# Patient Record
Sex: Female | Born: 1940 | Race: White | Hispanic: No | Smoking: Never smoker
Health system: Southern US, Community
[De-identification: ages and names within clinical notes are randomized; demographics above are authoritative.]

## PROBLEM LIST (undated history)

## (undated) DIAGNOSIS — Z9221 Personal history of antineoplastic chemotherapy: Secondary | ICD-10-CM

## (undated) DIAGNOSIS — M1990 Unspecified osteoarthritis, unspecified site: Secondary | ICD-10-CM

## (undated) DIAGNOSIS — C50919 Malignant neoplasm of unspecified site of unspecified female breast: Secondary | ICD-10-CM

## (undated) DIAGNOSIS — Z923 Personal history of irradiation: Secondary | ICD-10-CM

## (undated) DIAGNOSIS — F419 Anxiety disorder, unspecified: Secondary | ICD-10-CM

## (undated) DIAGNOSIS — D649 Anemia, unspecified: Secondary | ICD-10-CM

## (undated) DIAGNOSIS — F32A Depression, unspecified: Secondary | ICD-10-CM

## (undated) HISTORY — PX: TONSILLECTOMY: SUR1361

## (undated) HISTORY — PX: CHOLECYSTECTOMY: SHX55

## (undated) HISTORY — PX: WISDOM TOOTH EXTRACTION: SHX21

## (undated) HISTORY — PX: ABDOMINAL HYSTERECTOMY: SHX81

## (undated) HISTORY — PX: JOINT REPLACEMENT: SHX530

---

## 1974-02-15 HISTORY — PX: BREAST LUMPECTOMY: SHX2

## 1996-02-16 HISTORY — PX: ABDOMINAL HYSTERECTOMY: SHX81

## 1997-02-15 DIAGNOSIS — C50919 Malignant neoplasm of unspecified site of unspecified female breast: Secondary | ICD-10-CM

## 1997-02-15 DIAGNOSIS — C50411 Malignant neoplasm of upper-outer quadrant of right female breast: Secondary | ICD-10-CM

## 1997-02-15 HISTORY — DX: Malignant neoplasm of upper-outer quadrant of right female breast: C50.411

## 1997-02-15 HISTORY — DX: Malignant neoplasm of unspecified site of unspecified female breast: C50.919

## 1997-02-15 HISTORY — PX: BREAST LUMPECTOMY: SHX2

## 2014-08-30 ENCOUNTER — Emergency Department (HOSPITAL_COMMUNITY): Payer: Medicare Other

## 2014-08-30 ENCOUNTER — Emergency Department (HOSPITAL_COMMUNITY)
Admission: EM | Admit: 2014-08-30 | Discharge: 2014-08-31 | Disposition: A | Discharge status: Home / Self Care | Payer: Medicare Other | Attending: Emergency Medicine | Admitting: Emergency Medicine

## 2014-08-30 DIAGNOSIS — Z79899 Other long term (current) drug therapy: Secondary | ICD-10-CM | POA: Diagnosis not present

## 2014-08-30 DIAGNOSIS — R42 Dizziness and giddiness: Secondary | ICD-10-CM | POA: Diagnosis not present

## 2014-08-30 DIAGNOSIS — R410 Disorientation, unspecified: Secondary | ICD-10-CM | POA: Diagnosis not present

## 2014-08-30 DIAGNOSIS — R4182 Altered mental status, unspecified: Secondary | ICD-10-CM | POA: Diagnosis present

## 2014-08-30 DIAGNOSIS — N3000 Acute cystitis without hematuria: Secondary | ICD-10-CM | POA: Diagnosis not present

## 2014-08-30 LAB — COMPREHENSIVE METABOLIC PANEL
ALT: 28 U/L (ref 14–54)
AST: 31 U/L (ref 15–41)
Albumin: 3.5 g/dL (ref 3.5–5.0)
Alkaline Phosphatase: 50 U/L (ref 38–126)
Anion gap: 11 (ref 5–15)
BILIRUBIN TOTAL: 0.4 mg/dL (ref 0.3–1.2)
BUN: 17 mg/dL (ref 6–20)
CALCIUM: 8.7 mg/dL — AB (ref 8.9–10.3)
CHLORIDE: 101 mmol/L (ref 101–111)
CO2: 24 mmol/L (ref 22–32)
Creatinine, Ser: 0.82 mg/dL (ref 0.44–1.00)
GFR calc non Af Amer: >60 mL/min (ref >60)
GLUCOSE: 101 mg/dL — AB (ref 65–99)
POTASSIUM: 3.1 mmol/L — AB (ref 3.5–5.1)
Sodium: 136 mmol/L (ref 135–145)
Total Protein: 6.6 g/dL (ref 6.5–8.1)

## 2014-08-30 LAB — URINALYSIS, ROUTINE W REFLEX MICROSCOPIC
BILIRUBIN URINE: NEGATIVE
GLUCOSE, UA: NEGATIVE mg/dL
Hgb urine dipstick: NEGATIVE
KETONES UR: NEGATIVE mg/dL
Nitrite: NEGATIVE
Protein, ur: NEGATIVE mg/dL
Specific Gravity, Urine: 1.016 (ref 1.005–1.030)
UROBILINOGEN UA: 0.2 mg/dL (ref 0.0–1.0)
pH: 5 (ref 5.0–8.0)

## 2014-08-30 LAB — CBC WITH DIFFERENTIAL/PLATELET
Basophils Absolute: 0.1 10*3/uL (ref 0.0–0.1)
Basophils Relative: 1 % (ref 0–1)
EOS ABS: 0.2 10*3/uL (ref 0.0–0.7)
EOS PCT: 2 % (ref 0–5)
HEMATOCRIT: 35.2 % — AB (ref 36.0–46.0)
HEMOGLOBIN: 12.3 g/dL (ref 12.0–15.0)
LYMPHS ABS: 2.7 10*3/uL (ref 0.7–4.0)
Lymphocytes Relative: 31 % (ref 12–46)
MCH: 29.6 pg (ref 26.0–34.0)
MCHC: 34.9 g/dL (ref 30.0–36.0)
MCV: 84.6 fL (ref 78.0–100.0)
MONO ABS: 0.6 10*3/uL (ref 0.1–1.0)
MONOS PCT: 7 % (ref 3–12)
NEUTROS PCT: 59 % (ref 43–77)
Neutro Abs: 5.2 10*3/uL (ref 1.7–7.7)
Platelets: 248 10*3/uL (ref 150–400)
RBC: 4.16 MIL/uL (ref 3.87–5.11)
RDW: 12.7 % (ref 11.5–15.5)
WBC: 8.8 10*3/uL (ref 4.0–10.5)

## 2014-08-30 LAB — URINE MICROSCOPIC-ADD ON

## 2014-08-30 LAB — TROPONIN I: Troponin I: <0.03 ng/mL (ref <0.031)

## 2014-08-30 LAB — I-STAT CG4 LACTIC ACID, ED: Lactic Acid, Venous: 2.89 mmol/L (ref 0.5–2.0)

## 2014-08-30 MED ORDER — DIPHENHYDRAMINE HCL 50 MG/ML IJ SOLN
25.0000 mg | Freq: Once | INTRAMUSCULAR | Status: AC
  Administered 2014-08-30: 25 mg via INTRAVENOUS
  Filled 2014-08-30: qty 1

## 2014-08-30 MED ORDER — DEXTROSE 5 % IV SOLN
1.0000 g | Freq: Once | INTRAVENOUS | Status: AC
  Administered 2014-08-30: 1 g via INTRAVENOUS
  Filled 2014-08-30: qty 10

## 2014-08-30 MED ORDER — SODIUM CHLORIDE 0.9 % IV BOLUS (SEPSIS)
1000.0000 mL | Freq: Once | INTRAVENOUS | Status: AC
  Administered 2014-08-30: 1000 mL via INTRAVENOUS

## 2014-08-30 MED ORDER — METOCLOPRAMIDE HCL 5 MG/ML IJ SOLN
10.0000 mg | Freq: Once | INTRAMUSCULAR | Status: AC
  Administered 2014-08-30: 10 mg via INTRAVENOUS
  Filled 2014-08-30: qty 2

## 2014-08-30 MED ORDER — KETOROLAC TROMETHAMINE 30 MG/ML IJ SOLN
30.0000 mg | Freq: Once | INTRAMUSCULAR | Status: AC
  Administered 2014-08-30: 30 mg via INTRAVENOUS
  Filled 2014-08-30: qty 1

## 2014-08-30 NOTE — ED Notes (Signed)
Pt [place] movie [place] via [facility]. Pt family states she has been acting confused for a few months, but the pt lives out of town and the daughter hasn't seen her in "a while".  Pt flew in yesterday from [place] and the family has noticed a series of odd behavior. Pt went to movies today and kept falling asleep during the movie and was confused. There is a strong hx of dementia in the family. The pt mother and sister have alzheimer's along with a hx of tia and stroke.

## 2014-08-30 NOTE — ED Provider Notes (Signed)
CSN: [id number]     Arrival date & time 08/30/14  1723 History   First MD Initiated Contact with Patient 08/30/14 1727     Chief Complaint  Patient presents with  . Altered Mental Status     (Consider location/radiation/quality/duration/timing/severity/associated sxs/prior Treatment) Patient is a 74 y.o. female presenting with altered mental status.  Altered Mental Status Presenting symptoms: behavior changes and confusion   Severity:  Moderate Most recent episode:  Today Episode history:  Continuous Duration:  4 hours Timing:  Constant Progression:  Partially resolved Chronicity:  New Context comment:  While walking into a theater Associated symptoms: light-headedness   Associated symptoms: no abdominal pain, no nausea and no vomiting   Associated symptoms comment:  Tingling/numbness in RUE   No past medical history on file. No past surgical history on file. No family history on file. History  Substance Use Topics  . Smoking status: Not on file  . Smokeless tobacco: Not on file  . Alcohol Use: Not on file   OB History    No data available     Review of Systems  Gastrointestinal: Negative for nausea, vomiting and abdominal pain.  Neurological: Positive for light-headedness.  Psychiatric/Behavioral: Positive for confusion.  All other systems reviewed and are negative.     Allergies  Eggs or egg-derived products and Gluten meal  Home Medications   Prior to Admission medications   Medication Sig Start Date End Date Taking? Authorizing Provider  cephALEXin (KEFLEX) 500 MG capsule Take 1 capsule (500 mg total) by mouth 4 (four) times daily. 08/31/14   Debby Freiberg, MD  Cholecalciferol (VITAMIN D3) 3000 UNITS TABS Take 3,000 Units by mouth daily.   Yes Historical Provider, MD  doxycycline (VIBRA-TABS) 100 MG tablet Take 100 mg by mouth 2 (two) times daily. 08/22/14  Yes Historical Provider, MD  doxycycline (VIBRAMYCIN) 50 MG capsule Take 50 mg by mouth daily.  08/13/14  Yes Historical Provider, MD  DULoxetine (CYMBALTA) 60 MG capsule Take 60 mg by mouth daily. 05/27/14  Yes Historical Provider, MD  FML FORTE 0.25 % ophthalmic suspension Place 1 drop into both eyes 2 (two) times daily. 08/06/14  Yes Historical Provider, MD  hydroxypropyl cellulose (LACRISERT) 5 MG INST Place 5 mg into both eyes daily. 08/21/14  Yes Historical Provider, MD  ibuprofen (ADVIL,MOTRIN) 800 MG tablet Take 800 mg by mouth 3 (three) times daily.  08/14/14  Yes Historical Provider, MD  liothyronine (CYTOMEL) 5 MCG tablet Take 5 mcg by mouth daily.   Yes Historical Provider, MD  metFORMIN (GLUCOPHAGE) 1000 MG tablet Take 1,000 mg by mouth 2 (two) times daily. 08/05/14  Yes Historical Provider, MD  nystatin (MYCOSTATIN) 500000 UNITS TABS tablet Take 500,000 Units by mouth daily.   Yes Historical Provider, MD  prednisoLONE acetate (PRED FORTE) 1 % ophthalmic suspension Place 1 drop into both eyes 2 (two) times daily. 08/22/14  Yes Historical Provider, MD  RESTASIS 0.05 % ophthalmic emulsion Place 1 drop into both eyes 3 (three) times daily. 08/04/14  Yes Historical Provider, MD   BP 164/69 mmHg  Pulse 86  Temp(Src) 97.6 F (36.4 C) (Oral)  Resp 20  SpO2 99% Physical Exam  Constitutional: She is oriented to person, place, and time. She appears well-developed and well-nourished.  HENT:  Head: Normocephalic and atraumatic.  Right Ear: External ear normal.  Left Ear: External ear normal.  Eyes: Conjunctivae and EOM are normal. Pupils are equal, round, and reactive to light.  Neck: Normal range of motion. Neck  supple.  Cardiovascular: Normal rate, regular rhythm, normal heart sounds and intact distal pulses.   Pulmonary/Chest: Effort normal and breath sounds normal.  Abdominal: Soft. Bowel sounds are normal. There is no tenderness.  Musculoskeletal: Normal range of motion.  Neurological: She is alert and oriented to person, place, and time. She has normal strength and normal reflexes. No  cranial nerve deficit or sensory deficit. Gait (initially ataxic, however improves after standing) abnormal.  Skin: Skin is warm and dry.  Vitals reviewed.   ED Course  Procedures (including critical care time) Labs Review Labs Reviewed  CBC WITH DIFFERENTIAL/PLATELET - Abnormal; Notable for the following:    HCT 35.2 (*)    All other components within normal limits  COMPREHENSIVE METABOLIC PANEL - Abnormal; Notable for the following:    Potassium 3.1 (*)    Glucose, Bld 101 (*)    Calcium 8.7 (*)    All other components within normal limits  URINALYSIS, ROUTINE W REFLEX MICROSCOPIC (NOT AT [facility]) - Abnormal; Notable for the following:    Leukocytes, UA SMALL (*)    All other components within normal limits  URINE MICROSCOPIC-ADD ON - Abnormal; Notable for the following:    Squamous Epithelial / LPF FEW (*)    Bacteria, UA FEW (*)    Casts GRANULAR CAST (*)    Crystals CA OXALATE CRYSTALS (*)    All other components within normal limits  I-STAT CG4 LACTIC ACID, ED - Abnormal; Notable for the following:    Lactic Acid, Venous 2.89 (*)    All other components within normal limits  I-STAT CG4 LACTIC ACID, ED - Abnormal; Notable for the following:    Lactic Acid, Venous 2.08 (*)    All other components within normal limits  TROPONIN I    Imaging Review No results found.   EKG Interpretation None      MDM   Final diagnoses:  Acute cystitis without hematuria    74 y.o. female with pertinent PMH of chronic R hip pain presents with altered mental status.  Per family at bedside, since arriving yesterday the pt has been more confused that normal with slightly bizarre behavior.  4 hours PTA the pt had far more trouble walking and was weak with subjective RUE sensory decrease, however they watched a movie where the pt went to sleep.  She was difficult to arouse fully afterwards.  On arrival the pt has vitals and physical exam as above, marked only for some very mild initial  ataxia, however this resolved.  Consulted neurology who feel this is unlikely to be due to CVA or TIA, recommended MR.  This was unremarkable.  Spoke with family at length.  Pt DC home in stable condition.  Symptom free at time of dc.  I have reviewed all laboratory and imaging studies if ordered as above  1. Acute cystitis without hematuria         Debby Freiberg, MD 09/02/14 2336

## 2014-08-31 DIAGNOSIS — N3000 Acute cystitis without hematuria: Secondary | ICD-10-CM | POA: Diagnosis not present

## 2014-08-31 LAB — I-STAT CG4 LACTIC ACID, ED: Lactic Acid, Venous: 2.08 mmol/L (ref 0.5–2.0)

## 2014-08-31 MED ORDER — CEPHALEXIN 500 MG PO CAPS: 500.0000 mg | ORAL_CAPSULE | Freq: Four times a day (QID) | ORAL | Status: DC

## 2014-08-31 NOTE — Discharge Instructions (Signed)

## 2014-08-31 NOTE — ED Notes (Signed)
Pt is getting dressed.

## 2017-02-22 DIAGNOSIS — M545 Low back pain, unspecified: Secondary | ICD-10-CM | POA: Insufficient documentation

## 2017-02-22 DIAGNOSIS — M25569 Pain in unspecified knee: Secondary | ICD-10-CM | POA: Insufficient documentation

## 2017-02-22 HISTORY — DX: Pain in unspecified knee: M25.569

## 2017-02-22 HISTORY — DX: Low back pain, unspecified: M54.50

## 2018-01-31 DIAGNOSIS — Z96659 Presence of unspecified artificial knee joint: Secondary | ICD-10-CM | POA: Insufficient documentation

## 2018-01-31 HISTORY — DX: Presence of unspecified artificial knee joint: Z96.659

## 2018-10-26 ENCOUNTER — Other Ambulatory Visit: Payer: Self-pay | Admitting: Family Medicine

## 2018-10-26 DIAGNOSIS — Z1231 Encounter for screening mammogram for malignant neoplasm of breast: Secondary | ICD-10-CM

## 2018-11-30 ENCOUNTER — Ambulatory Visit (INDEPENDENT_AMBULATORY_CARE_PROVIDER_SITE_OTHER): Payer: Medicare Other | Admitting: Cardiovascular Disease

## 2018-11-30 ENCOUNTER — Encounter: Payer: Self-pay | Admitting: Cardiovascular Disease

## 2018-11-30 ENCOUNTER — Other Ambulatory Visit: Payer: Self-pay

## 2018-11-30 VITALS — BP 106/54 | HR 76 | Temp 97.2°F | Ht 65.0 in | Wt 133.0 lb

## 2018-11-30 DIAGNOSIS — E785 Hyperlipidemia, unspecified: Secondary | ICD-10-CM

## 2018-11-30 DIAGNOSIS — E875 Hyperkalemia: Secondary | ICD-10-CM | POA: Diagnosis not present

## 2018-11-30 DIAGNOSIS — Z008 Encounter for other general examination: Secondary | ICD-10-CM

## 2018-11-30 DIAGNOSIS — Z8249 Family history of ischemic heart disease and other diseases of the circulatory system: Secondary | ICD-10-CM

## 2018-11-30 DIAGNOSIS — R0989 Other specified symptoms and signs involving the circulatory and respiratory systems: Secondary | ICD-10-CM | POA: Diagnosis not present

## 2018-11-30 DIAGNOSIS — E782 Mixed hyperlipidemia: Secondary | ICD-10-CM | POA: Diagnosis not present

## 2018-11-30 HISTORY — DX: Hyperlipidemia, unspecified: E78.5

## 2018-11-30 NOTE — Patient Instructions (Signed)
Medication Instructions:  Your physician recommends that you continue on your current medications as directed. Please refer to the Current Medication list given to you today.  If you need a refill on your cardiac medications before your next appointment, please call your pharmacy.   Lab work: none If you have labs (blood work) drawn today and your tests are completely normal, you will receive your results only by: Marland Kitchen MyChart Message (if you have MyChart) OR . A paper copy in the mail If you have any lab test that is abnormal or we need to change your treatment, we will call you to review the results.  Testing/Procedures: Your physician has requested that you have a carotid duplex. This test is an ultrasound of the carotid arteries in your neck. It looks at blood flow through these arteries that supply the brain with blood. Allow one hour for this exam. There are no restrictions or special instructions. TO BE SCHEDULED   Follow-Up: At [facility], you and your health needs are our priority.  As part of our continuing mission to provide you with exceptional heart care, we have created designated Provider Care Teams.  These Care Teams include your primary Cardiologist (physician) and Advanced Practice Providers (APPs -  Physician Assistants and Nurse Practitioners) who all work together to provide you with the care you need, when you need it. You will need a follow up appointment in 12 months with Dr. Quay Burow.  Please call our office 2 months in advance to schedule this appointment.

## 2018-11-30 NOTE — Assessment & Plan Note (Signed)
History of hyperlipidemia intolerant to statin therapy with recent lipid profile performed 10/13/2018 by her PCP revealing total cholesterol 264, LDL 193 and HDL of 52 with an LDL particle number of 20-34.  She was on Repatha in the past but has not taken it recently.  We will attempt to restart her on Repatha.

## 2018-11-30 NOTE — Progress Notes (Signed)
11/30/2018 Jodi Summers   1940/08/09  [id number]  Primary Physician System, Pcp Not In Primary Cardiologist: Lorretta Harp MD Renae Gloss  HPI:  Jodi Summers is a 78 y.o. thin appearing widowed Caucasian female mother of 61, grandmother of 6 grandchildren referred by her PCP, Dr. Salomon Fick, to be established in my practice.  She is accompanied by [name].  She recently relocated from [place] to [place] in April to be closer to her children.  Her only cardiac risk factor is hyperlipidemia intolerant to statin therapy.  Her father did have a myocardial infarction.  She is never had a heart attack or stroke.  She denies chest pain or shortness of breath.  She was on Repatha in the past but has not been on it recently.  Recent lipid profile performed 10/05/2018 by her PCP revealed total cholesterol 264, LDL 193 with a particle number of 2234 and HDL 52.  She has had breast cancer in the past and has had surgery on her right breast.   Current Meds  Medication Sig  . Cholecalciferol (VITAMIN D3) 3000 UNITS TABS Take 3,000 Units by mouth daily.  Marland Kitchen denosumab (PROLIA) 60 MG/ML SOSY injection Inject 60 mg into the skin every 6 (six) months.  Marland Kitchen ibuprofen (ADVIL,MOTRIN) 800 MG tablet Take 800 mg by mouth 3 (three) times daily.   . Omega-3 1000 MG CAPS Take 1 capsule by mouth 2 (two) times daily.  . RESTASIS 0.05 % ophthalmic emulsion Place 1 drop into both eyes 3 (three) times daily.     Allergies  Allergen Reactions  . Eggs Or Egg-Derived Products   . Gluten Meal     Social History   Socioeconomic History  . Marital status: Widowed    Spouse name: Not on file  . Number of children: Not on file  . Years of education: Not on file  . Highest education level: Not on file  Occupational History  . Not on file  Social Needs  . Financial resource strain: Not on file  . Food insecurity    Worry: Not on file    Inability: Not on file  . Transportation needs    Medical: Not on file    Non-medical: Not on file  Tobacco Use  . Smoking status: Never Smoker  . Smokeless tobacco: Never Used  Substance and Sexual Activity  . Alcohol use: Not on file  . Drug use: Never  . Sexual activity: Not on file  Lifestyle  . Physical activity    Days per week: Not on file    Minutes per session: Not on file  . Stress: Not on file  Relationships  . Social Herbalist on phone: Not on file    Gets together: Not on file    Attends religious service: Not on file    Active member of club or organization: Not on file    Attends meetings of clubs or organizations: Not on file    Relationship status: Not on file  . Intimate partner violence    Fear of current or ex partner: Not on file    Emotionally abused: Not on file    Physically abused: Not on file    Forced sexual activity: Not on file  Other Topics Concern  . Not on file  Social History Narrative  . Not on file     Review of Systems: General: negative for chills, fever, night sweats or weight changes.  Cardiovascular: negative for chest pain, dyspnea on exertion, edema, orthopnea, palpitations, paroxysmal nocturnal dyspnea or shortness of breath Dermatological: negative for rash Respiratory: negative for cough or wheezing Urologic: negative for hematuria Abdominal: negative for nausea, vomiting, diarrhea, bright red blood per rectum, melena, or hematemesis Neurologic: negative for visual changes, syncope, or dizziness All other systems reviewed and are otherwise negative except as noted above.    Blood pressure 105/65, pulse 76, temperature (!) 97.2 F (36.2 C), temperature source Temporal, height 5\' 5"  (1.651 m), weight 133 lb (60.3 kg), SpO2 98 %.  General appearance: alert and no distress Neck: no adenopathy, no JVD, supple, symmetrical, trachea midline, thyroid not enlarged, symmetric, no tenderness/mass/nodules and Soft bilateral carotid bruits Lungs: clear to auscultation  bilaterally Heart: regular rate and rhythm, S1, S2 normal, no murmur, click, rub or gallop Extremities: extremities normal, atraumatic, no cyanosis or edema Pulses: 2+ and symmetric Skin: Skin color, texture, turgor normal. No rashes or lesions Neurologic: Alert and oriented X 3, normal strength and tone. Normal symmetric reflexes. Normal coordination and gait  EKG sinus rhythm at 75 without ST or T wave changes.  Personally reviewed this EKG.  ASSESSMENT AND PLAN:   Hyperlipidemia History of hyperlipidemia intolerant to statin therapy with recent lipid profile performed 10/13/2018 by her PCP revealing total cholesterol 264, LDL 193 and HDL of 52 with an LDL particle number of 20-34.  She was on Repatha in the past but has not taken it recently.  We will attempt to restart her on Repatha.      Lorretta Harp MD FACP,FACC,FAHA, [facility] 11/30/2018 11:43 AM

## 2018-12-05 ENCOUNTER — Encounter: Payer: Self-pay | Admitting: *Deleted

## 2018-12-05 ENCOUNTER — Other Ambulatory Visit: Payer: Self-pay

## 2018-12-05 ENCOUNTER — Ambulatory Visit (HOSPITAL_COMMUNITY)
Admission: RE | Admit: 2018-12-05 | Discharge: 2018-12-05 | Disposition: A | Discharge status: Home / Self Care | Payer: Medicare Other | Source: Ambulatory Visit | Attending: Cardiology | Admitting: Cardiology

## 2018-12-05 DIAGNOSIS — R0989 Other specified symptoms and signs involving the circulatory and respiratory systems: Secondary | ICD-10-CM | POA: Insufficient documentation

## 2018-12-11 ENCOUNTER — Telehealth: Payer: Self-pay

## 2018-12-11 MED ORDER — REPATHA SURECLICK 140 MG/ML ~~LOC~~ SOAJ: 140.0000 mg | SUBCUTANEOUS | 11 refills | Status: DC

## 2018-12-11 NOTE — Telephone Encounter (Signed)
Called and lmomed the pt regarding the approval of the repatha and sent rx instructed the pt to callback if the medication is unaffordable

## 2018-12-13 ENCOUNTER — Other Ambulatory Visit: Payer: Self-pay

## 2018-12-13 ENCOUNTER — Ambulatory Visit
Admission: RE | Admit: 2018-12-13 | Discharge: 2018-12-13 | Disposition: A | Discharge status: Home / Self Care | Payer: Medicare Other | Source: Ambulatory Visit | Attending: Family Medicine | Admitting: Family Medicine

## 2018-12-13 DIAGNOSIS — Z1231 Encounter for screening mammogram for malignant neoplasm of breast: Secondary | ICD-10-CM

## 2018-12-13 HISTORY — DX: Personal history of irradiation: Z92.3

## 2018-12-13 HISTORY — DX: Malignant neoplasm of unspecified site of unspecified female breast: C50.919

## 2018-12-13 HISTORY — DX: Personal history of antineoplastic chemotherapy: Z92.21

## 2019-04-03 ENCOUNTER — Other Ambulatory Visit: Payer: Self-pay

## 2019-04-03 ENCOUNTER — Ambulatory Visit (INDEPENDENT_AMBULATORY_CARE_PROVIDER_SITE_OTHER): Payer: Medicare Other | Admitting: Sports Medicine

## 2019-04-03 VITALS — BP 116/54 | Ht 65.0 in | Wt 133.0 lb

## 2019-04-03 DIAGNOSIS — M2140 Flat foot [pes planus] (acquired), unspecified foot: Secondary | ICD-10-CM

## 2019-04-03 DIAGNOSIS — M25551 Pain in right hip: Secondary | ICD-10-CM

## 2019-04-03 DIAGNOSIS — M2142 Flat foot [pes planus] (acquired), left foot: Secondary | ICD-10-CM

## 2019-04-03 DIAGNOSIS — M1611 Unilateral primary osteoarthritis, right hip: Secondary | ICD-10-CM | POA: Diagnosis not present

## 2019-04-03 DIAGNOSIS — M2141 Flat foot [pes planus] (acquired), right foot: Secondary | ICD-10-CM

## 2019-04-03 HISTORY — DX: Flat foot (pes planus) (acquired), unspecified foot: M21.40

## 2019-04-03 NOTE — Assessment & Plan Note (Signed)
Considering her poor ROM this is likely advanced OA  With her lack of too much pain and current desire not to be aggressive we will plan supportive care  Try tumeric  Better shoe wear and orthotics

## 2019-04-03 NOTE — Assessment & Plan Note (Signed)
She needs custom orthotics Better and high cushion support for shoes  We scheduled for this

## 2019-04-03 NOTE — Progress Notes (Signed)
   [facility] [address] [place], [place] [address] Phone: [phone] Fax: [phone]   Patient Name: Jodi Summers Date of Birth: 08/05/1940 Medical Record Number: [id number] Gender: female Date of Encounter: 04/03/2019  SUBJECTIVE:      Chief Complaint:  Right hip pain   HPI:  Patient is a 79 year old female presenting with chronic right hip pain.  She had a traumatic fall about 4 years ago, and at that time was told that she would likely eventually require right hip replacement.  She describes having more difficulty walking and performing normal activities.  Her daughter accompanies her today, and feels it is hindering her from participating in her centers exercise classes.  She is utilizing a cane prophylactically for comfort.  She will take an Advil daily for the pain.  She did complete formal physical therapy for the right hip last fall that seemed to offer some relief, but she is not continue those exercises.  She denies any radiating pain into her toes.  No erythema or swelling at the hip.  The pain does radiate into the groin.     ROS:     See HPI.   PERTINENT  PMH / PSH / FH / SH:  Past Medical, Surgical, Social, and Family History Reviewed & Updated in the EMR. Pertinent findings include:  Left TKA in fall 2020, osteoporosis, pes planus   OBJECTIVE:  BP (!) 116/54   Ht 5\' 5"  (1.651 m)   Wt 133 lb (60.3 kg)   BMI 22.13 kg/m  Physical Exam:  Vital signs are reviewed.   GEN: Alert and oriented, NAD Pulm: Breathing unlabored PSY: normal mood, congruent affect  MSK: Right hip: ROM IR: 3 deg, ER: 45 Deg, Flexion: 100 Deg,  Strength IR: 4/5, ER: 4/5, Flexion: 4/5, Abduction: 5/5 Pelvic alignment unremarkable to inspection and palpation. Standing hip rotation and gait with positive trendelenburg sign  Greater trochanter without tenderness to palpation. No tenderness over piriformis. Pain with FADIR. 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 Right hip pain  Patient likely suffers from hip OA. This was confirmed in [place] when she was hospitalized for her TKA.  Given the pes planus with bilateral navicular drop, we will start with making custom orthotics to give patient better support at her feet after she purchases new sneakers and brings them to her appointment.  We recommended continuing walking to the point of discomfort.  She was also taught some very basic stretches today.   Lanier Clam, DO, ATC Sports Medicine Fellow  I observed and examined the patient with Dr. Kathrynn Speed and agree with assessment and plan.  Note reviewed and modified by me.  I spent 32 minutes with this patient. Over 50% of visit was spend in counseling and coordination of care for problems with Hip osteoarthritis and foot pain.  Ila Mcgill, MD

## 2019-04-24 ENCOUNTER — Other Ambulatory Visit: Payer: Self-pay

## 2019-04-24 ENCOUNTER — Ambulatory Visit (INDEPENDENT_AMBULATORY_CARE_PROVIDER_SITE_OTHER): Payer: Medicare Other | Admitting: Sports Medicine

## 2019-04-24 ENCOUNTER — Encounter: Payer: Self-pay | Admitting: Sports Medicine

## 2019-04-24 VITALS — BP 120/62 | Ht 65.5 in | Wt 140.0 lb

## 2019-04-24 DIAGNOSIS — M2142 Flat foot [pes planus] (acquired), left foot: Secondary | ICD-10-CM | POA: Diagnosis not present

## 2019-04-24 DIAGNOSIS — M2141 Flat foot [pes planus] (acquired), right foot: Secondary | ICD-10-CM | POA: Diagnosis present

## 2019-04-24 NOTE — Progress Notes (Signed)
HPI: Patient is here today to be fitted for standard orthotic. Pt is being treated for bilateral navicular drop, right worse than left in the setting of pes planus  BP 120/62   Ht 5' 5.5" (1.664 m)   Wt 140 lb (63.5 kg)   BMI 22.94 kg/m   Patient was fitted for a standard, cushioned, semi-rigid orthotic.  The orthotic was heated and the patient stood on the orthotic blank positioned on the orthotic stand. The patient was positioned in subtalar neutral position and 10 degrees of ankle dorsiflexion in a weight bearing stance. After molding, a stable Fast-Tech EVA base was applied to the orthotic blank.   The blank was ground to a stable position for weight bearing. Size: 7 base: Blue EVA posting: none additional orthotic padding: Bilateral scaphoid pads  Patient understood RTC needs and will follow up prn  Lanier Clam, DO, ATC Sports Medicine Fellow  I observed and examined the patient with Dr. Kathrynn Speed and agree with assessment and plan.  Note reviewed and modified by me. Ila Mcgill, MD

## 2019-07-05 ENCOUNTER — Other Ambulatory Visit: Payer: Self-pay

## 2019-07-05 ENCOUNTER — Ambulatory Visit (INDEPENDENT_AMBULATORY_CARE_PROVIDER_SITE_OTHER): Payer: Medicare Other | Admitting: Sports Medicine

## 2019-07-05 ENCOUNTER — Encounter: Payer: Self-pay | Admitting: Sports Medicine

## 2019-07-05 VITALS — BP 118/62 | Ht 65.5 in | Wt 135.0 lb

## 2019-07-05 DIAGNOSIS — M1611 Unilateral primary osteoarthritis, right hip: Secondary | ICD-10-CM | POA: Diagnosis present

## 2019-07-05 NOTE — Patient Instructions (Signed)
The pain in your hip is caused by arthritis of the hip.  This is where the cartilage is worn down there is no cushioning for the bones when you walk. -You may take Aleve and Tylenol as needed for your pain -Given the advanced stage of the arthritis it is unlikely doing steroid injections would provide you with much benefit -You may continue to work on your hip strengthening exercises at home from before if you find these helpful -We will refer you to Dr. Ninfa Linden to discuss the possibility of a hip replacement surgery -Please not hesitate to call if you have any questions or concerns

## 2019-07-05 NOTE — Progress Notes (Signed)
PCP: Modena Nunnery, MD  Subjective:   HPI: Patient is a 79 y.o. female here for follow-up of right hip pain.  Patient has had chronic right hip pain for the last several years.  Pain is located in the groin and radiates towards her buttocks.  Patient had x-rays done at [facility] which confirmed the presence of osteoarthritis in the hip.  Patient notes pain at nighttime as well as intermittent pain with walking throughout the day.  No radiation of the pain.  Patient was evaluated for this back in February.  She was given strengthening exercises to work on at home.  She is also done formal physical therapy in the past for her hip.  Neither of these have helped improve her pain.  Patient was also seen again in March given orthotics for pes planus to help provide additional cushioning for her hip.  Patient notes this did help pain in her feet but did not help her hip pain.  Over-the-counter anti-inflammatories mildly improved her pain.   Review of Systems: See HPI above. + night time pain + now needing a cane for walking to lessen pain  Past Medical History:  Diagnosis Date  . Breast cancer ([place]) 1970s   right breast  . Personal history of chemotherapy   . Personal history of radiation therapy     Current Outpatient Medications on File Prior to Visit  Medication Sig Dispense Refill  . Cholecalciferol (VITAMIN D3) 3000 UNITS TABS Take 3,000 Units by mouth daily.    Marland Kitchen denosumab (PROLIA) 60 MG/ML SOSY injection Inject 60 mg into the skin every 6 (six) months.    . Evolocumab (REPATHA SURECLICK) XX123456 MG/ML SOAJ Inject 140 mg into the skin every 14 (fourteen) days. 2 pen 11  . ibuprofen (ADVIL,MOTRIN) 800 MG tablet Take 800 mg by mouth 3 (three) times daily.     . Omega-3 1000 MG CAPS Take 1 capsule by mouth 2 (two) times daily.    . RESTASIS 0.05 % ophthalmic emulsion Place 1 drop into both eyes 3 (three) times daily.     No current facility-administered medications on file prior to visit.     Past Surgical History:  Procedure Laterality Date  . BREAST LUMPECTOMY Right 1976    Allergies  Allergen Reactions  . Eggs Or Egg-Derived Products   . Gluten Meal     Social History   Socioeconomic History  . Marital status: Widowed    Spouse name: Not on file  . Number of children: Not on file  . Years of education: Not on file  . Highest education level: Not on file  Occupational History  . Not on file  Tobacco Use  . Smoking status: Never Smoker  . Smokeless tobacco: Never Used  Substance and Sexual Activity  . Alcohol use: Not on file  . Drug use: Never  . Sexual activity: Not on file  Other Topics Concern  . Not on file  Social History Narrative  . Not on file   Social Determinants of Health   Financial Resource Strain:   . Difficulty of Paying Living Expenses:   Food Insecurity:   . Worried About Charity fundraiser in the Last Year:   . Arboriculturist in the Last Year:   Transportation Needs:   . Film/video editor (Medical):   Marland Kitchen Lack of Transportation (Non-Medical):   Physical Activity:   . Days of Exercise per Week:   . Minutes of Exercise per Session:  Stress:   . Feeling of Stress :   Social Connections:   . Frequency of Communication with Friends and Family:   . Frequency of Social Gatherings with Friends and Family:   . Attends Religious Services:   . Active Member of Clubs or Organizations:   . Attends Archivist Meetings:   Marland Kitchen Marital Status:   Intimate Partner Violence:   . Fear of Current or Ex-Partner:   . Emotionally Abused:   Marland Kitchen Physically Abused:   . Sexually Abused:     History reviewed. No pertinent family history.      Objective:  Physical Exam: BP 118/62   Ht 5' 5.5" (1.664 m)   Wt 135 lb (61.2 kg)   BMI 22.12 kg/m  Gen: NAD, comfortable in exam room Lungs: Breathing comfortably on room air Hip Exam Right -Inspection: No deformity, no discoloration -Palpation: SI joint: non-tender; greater  trochanter: non-tender -ROM: Unable to internally rotate her hip.  Patient had about 30 degrees of external rotation.  Normal range of motion with hip flexion -Strength: Flexion: 5/5; Extension 5/5; Abduction: 5/5 -Special Tests; Log roll: Positive -Limb neurovascularly intact  Left hip has 35 deg ER and 15 deg IR   Assessment & Plan:  Patient is a 79 y.o. female here for follow-up of right hip pain  1.  Right hip osteoarthritis -X-rays from 8 months ago were reviewed showing moderate severe osteoarthritis of the right hip.  Patient also with a cam deformity -Patient will referred to Dr. Ninfa Linden discussed surgical treatments -Patient will take over-the-counter anti-inflammatories as needed for pain in the meantime  Patient will follow up here as needed  I observed and examined the patient with Dr. Margy Clarks and agree with assessment and plan.  Note reviewed and modified by me. Ila Mcgill, MD

## 2019-07-18 ENCOUNTER — Ambulatory Visit: Payer: Medicare Other | Admitting: Orthopaedic Surgery

## 2019-08-08 ENCOUNTER — Ambulatory Visit (INDEPENDENT_AMBULATORY_CARE_PROVIDER_SITE_OTHER): Payer: Medicare Other

## 2019-08-08 ENCOUNTER — Encounter: Payer: Self-pay | Admitting: Orthopaedic Surgery

## 2019-08-08 ENCOUNTER — Other Ambulatory Visit: Payer: Self-pay

## 2019-08-08 ENCOUNTER — Ambulatory Visit (INDEPENDENT_AMBULATORY_CARE_PROVIDER_SITE_OTHER): Payer: Medicare Other | Admitting: Orthopaedic Surgery

## 2019-08-08 DIAGNOSIS — M1611 Unilateral primary osteoarthritis, right hip: Secondary | ICD-10-CM | POA: Diagnosis not present

## 2019-08-08 DIAGNOSIS — M25551 Pain in right hip: Secondary | ICD-10-CM

## 2019-08-08 HISTORY — DX: Unilateral primary osteoarthritis, right hip: M16.11

## 2019-08-08 NOTE — Progress Notes (Signed)
Office Visit Note   Patient: Jodi Summers           Date of Birth: 06-Nov-1940           MRN: [id number] Visit Date: 08/08/2019              Requested by: Stefanie Libel, MD [address],  [place] [address] PCP: Modena Nunnery, MD   Assessment & Plan: Visit Diagnoses:  1. Pain in right hip   2. Unilateral primary osteoarthritis, right hip     Plan: Given the severity of her arthritis and how this is detrimentally affecting her actives daily living, her quality of life, and her mobility, she does wish to proceed with total hip arthroplasty surgery this year.  I showed her her x-rays and went over the surgery in detail.  We talked about the interoperative and postoperative course and what to expect.  I discussed in detail the risk and benefits of surgery.  All questions and concerns were answered and addressed.  We will work on seeing how this fits into her and her family's schedule.  Follow-Up Instructions: Return for 2 weeks post-op.   Orders:  Orders Placed This Encounter  Procedures  . XR HIP UNILAT W OR W/O PELVIS 2-3 VIEWS RIGHT   No orders of the defined types were placed in this encounter.     Procedures: No procedures performed   Clinical Data: No additional findings.   Subjective: Chief Complaint  Patient presents with  . Right Hip - Pain  The patient is a very pleasant and active 79 year old female who comes in for evaluation treatment of severe right hip pain and known osteoarthritis of the right hip.  She sustained a mechanical fall about 5 years ago injuring that hip.  She has had a right knee replacement.  That was done when she was living in [place].  She now lives up here with family.  She states and an independent living facility.  She does ambulate with a cane.  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  Her right hip pain is daily.  It is definitely affecting her actives daily living, quality of life and her mobility at this  standpoint.  She has tried failed all forms of conservative treatment for over 12 months.  This includes anti-inflammatories, activity modification, and ambulating with assistive device.  She is worked on Hotel manager exercises as well.  She is not a smoker and not a diabetic.  She has no other significant active medical issues.  HPI  Review of Systems She currently denies any headache, chest pain, shortness of breath, fever, chills, nausea, vomiting  Objective: Vital Signs: There were no vitals taken for this visit.  Physical Exam She is alert and orient x3 and in no acute distress Ortho Exam Examination of her left hip is normal.  Examination of right hip show significant stiffness and severe pain with internal and external rotation. Specialty Comments:  No specialty comments available.  Imaging: XR HIP UNILAT W OR W/O PELVIS 2-3 VIEWS RIGHT  Result Date: 08/08/2019 A low AP pelvis and lateral of the right hip show severe end-stage arthritis of the right hip.  There is complete loss of the joint space.  There is cystic changes in the acetabulum and femoral head as well as large periarticular osteophytes.    PMFS History: Patient Active Problem List   Diagnosis Date Noted  . Unilateral primary osteoarthritis, 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  . Osteoarthritis of right  hip 04/03/2019  . Collapsed arches 04/03/2019  . Hyperlipidemia 11/30/2018   Past Medical History:  Diagnosis Date  . Breast cancer ([place]) 1970s   right breast  . Personal history of chemotherapy   . Personal history of radiation therapy     History reviewed. No pertinent family history.  Past Surgical History:  Procedure Laterality Date  . BREAST LUMPECTOMY Right 1976   Social History   Occupational History  . Not on file  Tobacco Use  . Smoking status: Never Smoker  . Smokeless tobacco: Never Used  Substance and Sexual Activity  . Alcohol use: Not on file  . Drug use: Never  . Sexual activity: Not on file

## 2019-09-21 ENCOUNTER — Telehealth: Payer: Self-pay | Admitting: Orthopaedic Surgery

## 2019-09-21 ENCOUNTER — Other Ambulatory Visit: Payer: Self-pay | Admitting: Orthopaedic Surgery

## 2019-09-21 MED ORDER — TRAMADOL HCL 50 MG PO TABS: 50.0000 mg | ORAL_TABLET | Freq: Four times a day (QID) | ORAL | 0 refills | Status: DC | PRN

## 2019-09-21 NOTE — Telephone Encounter (Signed)
Pt called stating she's been taking bupropion and it's not helping pt would like to see if Dr.Blackman can send something else in for her hip pain.

## 2019-09-21 NOTE — Telephone Encounter (Signed)
LMOM for patient this was sent in for her  °

## 2019-09-21 NOTE — Telephone Encounter (Signed)
Please advise 

## 2019-09-21 NOTE — Telephone Encounter (Signed)
I will send in some Tramadol to try short-term.

## 2019-09-28 ENCOUNTER — Other Ambulatory Visit: Payer: Self-pay

## 2019-10-17 NOTE — Progress Notes (Signed)
Please enter orders for PAT visit scheduled for 10-18-19.

## 2019-10-17 NOTE — Patient Instructions (Addendum)
DUE TO COVID-19 ONLY ONE VISITOR IS ALLOWED TO COME WITH YOU AND STAY IN THE WAITING ROOM ONLY  DURING PRE OP AND PROCEDURE.   IF YOU WILL BE ADMITTED INTO THE HOSPITAL YOU ARE ALLOWED ONE SUPPORT PERSON DURING VISITATION  HOURS ONLY (10AM -8PM)   . The support person may change daily. . The support person must pass our screening, gel in and out, and wear a mask at all times, including in the patient's room. . Patients must also wear a mask when staff or their support person are in the room.   COVID SWAB TESTING MUST BE COMPLETED ON:  Tuesday, 10-23-19 @ 2:50 PM    [address] [place], [place] [address]  (Must self quarantine after testing. Follow instructions on  handout.)        Your procedure is scheduled on:  Friday, 10-26-19   Report to [facility] Main  Entrance   Report to Short Stay at 5:30 AM   [facility])      Call this number if you have problems the morning of surgery [phone]   Do not eat food :After Midnight.   May have liquids until  4:00 AM  day of surgery  CLEAR LIQUID DIET  Foods Allowed                                                                     Foods Excluded  Water, Black Coffee and tea, regular and decaf            liquids that you cannot  Plain Jell-O in any flavor  (No red)                                  see through such as: Fruit ices (not with fruit pulp)                                      milk, soups, orange juice              Iced Popsicles (No red)                                      All solid food                                   Apple juices Sports drinks like Gatorade (No red) Lightly seasoned clear broth or consume(fat free) Sugar, honey syrup        Oral Hygiene is also important to reduce your risk of infection.                                     Remember - BRUSH YOUR TEETH THE MORNING OF SURGERY WITH YOUR REGULAR TOOTHPASTE   Do NOT smoke after Midnight   Take these medicines the morning of surgery with  A SIP  OF WATER:   None                                You may not have any metal on your body including hair pins, jewelry, and body piercings             Do not wear make-up, lotions, powders, perfumes/cologne, or deodorant             Do not wear nail polish.  Do not shave  48 hours prior to surgery.      Do not bring valuables to the hospital. [place].   Contacts, dentures or bridgework may not be worn into surgery.      Patients discharged the day of surgery will not be allowed to drive home.                Please read over the following fact sheets you were given: IF YOU HAVE QUESTIONS ABOUT YOUR PRE OP INSTRUCTIONS PLEASE CALL [phone]   [facility] - Preparing for Surgery Before surgery, you can play an important role.  Because skin is not sterile, your skin needs to be as free of germs as possible.  You can reduce the number of germs on your skin by washing with CHG (chlorahexidine gluconate) soap before surgery.  CHG is an antiseptic cleaner which kills germs and bonds with the skin to continue killing germs even after washing. Please DO NOT use if you have an allergy to CHG or antibacterial soaps.  If your skin becomes reddened/irritated stop using the CHG and inform your nurse when you arrive at Short Stay. Do not shave (including legs and underarms) for at least 48 hours prior to the first CHG shower.  You may shave your face/neck.  Please follow these instructions carefully:  1.  Shower with CHG Soap the night before surgery and the  morning of surgery.  2.  If you choose to wash your hair, wash your hair first as usual with your normal  shampoo.  3.  After you shampoo, rinse your hair and body thoroughly to remove the shampoo.                             4.  Use CHG as you would any other liquid soap.  You can apply chg directly to the skin and wash.  Gently with a scrungie or clean washcloth.  5.  Apply the CHG Soap to your body  ONLY FROM THE NECK DOWN.   Do   not use on face/ open                           Wound or open sores. Avoid contact with eyes, ears mouth and   genitals (private parts).                       Wash face,  Genitals (private parts) with your normal soap.             6.  Wash thoroughly, paying special attention to the area where your    surgery  will be performed.  7.  Thoroughly rinse your body with warm water from the neck down.  8.  DO NOT shower/wash with your normal soap after using and rinsing off the  CHG Soap.                9.  Pat yourself dry with a clean towel.            10.  Wear clean pajamas.            11.  Place clean sheets on your bed the night of your first shower and do not  sleep with pets. Day of Surgery : Do not apply any lotions/deodorants the morning of surgery.  Please wear clean clothes to the hospital/surgery center.  FAILURE TO FOLLOW THESE INSTRUCTIONS MAY RESULT IN THE CANCELLATION OF YOUR SURGERY  PATIENT SIGNATURE_________________________________  NURSE SIGNATURE__________________________________  ________________________________________________________________________

## 2019-10-17 NOTE — Progress Notes (Addendum)
COVID Vaccine Completed:  x2 Date COVID Vaccine completed:  1/21 COVID vaccine manufacturer: Pfizer    Moderna   [employer]   PCP - Modena Nunnery, MD Cardiologist -  Dr. Quay Burow  Chest x-ray -  EKG - 11-30-18 in Epic Stress Test -  ECHO -  Cardiac Cath -   Sleep Study -  CPAP -   Fasting Blood Sugar -  Checks Blood Sugar _____ times a day  Blood Thinner Instructions: Aspirin Instructions: Last Dose:  Anesthesia review:   Patient denies shortness of breath, fever, cough and chest pain at PAT appointment   Patient verbalized understanding of instructions that were given to them at the PAT appointment. Patient was also instructed that they will need to review over the PAT instructions again at home before surgery.

## 2019-10-18 ENCOUNTER — Encounter (HOSPITAL_COMMUNITY): Payer: Self-pay

## 2019-10-18 ENCOUNTER — Other Ambulatory Visit: Payer: Self-pay

## 2019-10-18 ENCOUNTER — Encounter (HOSPITAL_COMMUNITY)
Admission: RE | Admit: 2019-10-18 | Discharge: 2019-10-18 | Disposition: A | Discharge status: Home / Self Care | Payer: Medicare Other | Source: Ambulatory Visit | Attending: Orthopaedic Surgery | Admitting: Orthopaedic Surgery

## 2019-10-18 ENCOUNTER — Other Ambulatory Visit (HOSPITAL_COMMUNITY): Payer: Medicare Other

## 2019-10-18 DIAGNOSIS — Z01812 Encounter for preprocedural laboratory examination: Secondary | ICD-10-CM | POA: Insufficient documentation

## 2019-10-18 HISTORY — DX: Depression, unspecified: F32.A

## 2019-10-18 HISTORY — DX: Unspecified osteoarthritis, unspecified site: M19.90

## 2019-10-18 HISTORY — DX: Anxiety disorder, unspecified: F41.9

## 2019-10-18 HISTORY — DX: Anemia, unspecified: D64.9

## 2019-10-18 LAB — CBC
HCT: 40 % (ref 36.0–46.0)
Hemoglobin: 13.4 g/dL (ref 12.0–15.0)
MCH: 30.7 pg (ref 26.0–34.0)
MCHC: 33.5 g/dL (ref 30.0–36.0)
MCV: 91.5 fL (ref 80.0–100.0)
Platelets: 192 10*3/uL (ref 150–400)
RBC: 4.37 MIL/uL (ref 3.87–5.11)
RDW: 12.2 % (ref 11.5–15.5)
WBC: 7.2 10*3/uL (ref 4.0–10.5)
nRBC: 0 % (ref 0.0–0.2)

## 2019-10-18 LAB — SURGICAL PCR SCREEN
MRSA, PCR: NEGATIVE
Staphylococcus aureus: NEGATIVE

## 2019-10-23 ENCOUNTER — Other Ambulatory Visit (HOSPITAL_COMMUNITY)
Admission: RE | Admit: 2019-10-23 | Discharge: 2019-10-23 | Disposition: A | Discharge status: Home / Self Care | Payer: Medicare Other | Source: Ambulatory Visit | Attending: Orthopaedic Surgery | Admitting: Orthopaedic Surgery

## 2019-10-23 ENCOUNTER — Other Ambulatory Visit: Payer: Self-pay | Admitting: Physician Assistant

## 2019-10-23 DIAGNOSIS — Z01812 Encounter for preprocedural laboratory examination: Secondary | ICD-10-CM | POA: Diagnosis present

## 2019-10-23 DIAGNOSIS — Z20822 Contact with and (suspected) exposure to covid-19: Secondary | ICD-10-CM | POA: Diagnosis not present

## 2019-10-24 LAB — SARS CORONAVIRUS 2 (TAT 6-24 HRS): SARS Coronavirus 2: NEGATIVE

## 2019-10-25 ENCOUNTER — Telehealth: Payer: Self-pay | Admitting: Orthopaedic Surgery

## 2019-10-25 ENCOUNTER — Other Ambulatory Visit: Payer: Self-pay | Admitting: Orthopaedic Surgery

## 2019-10-25 MED ORDER — ONDANSETRON 4 MG PO TBDP: 4.0000 mg | ORAL_TABLET | Freq: Three times a day (TID) | ORAL | 0 refills | Status: DC | PRN

## 2019-10-25 MED ORDER — ASPIRIN 81 MG PO CHEW: 81.0000 mg | CHEWABLE_TABLET | Freq: Two times a day (BID) | ORAL | 0 refills | Status: DC

## 2019-10-25 MED ORDER — METHOCARBAMOL 500 MG PO TABS: 500.0000 mg | ORAL_TABLET | Freq: Four times a day (QID) | ORAL | 1 refills | Status: DC | PRN

## 2019-10-25 MED ORDER — OXYCODONE HCL 5 MG PO TABS: 5.0000 mg | ORAL_TABLET | ORAL | 0 refills | Status: DC | PRN

## 2019-10-25 NOTE — Telephone Encounter (Signed)
Pt states she's still using clobetasol propionate due to her itchy skin and she would just like to make sure it will be ok for her to be put to sleep with her condition? Pt would like a CB   [phone]

## 2019-10-25 NOTE — Telephone Encounter (Signed)
Please advise 

## 2019-10-25 NOTE — H&P (Signed)
TOTAL HIP ADMISSION H&P  Patient is admitted for right total hip arthroplasty.  Subjective:  Chief Complaint: right hip pain  HPI: Jodi Summers, 79 y.o. female, has a history of pain and functional disability in the right hip(s) due to arthritis and patient has failed non-surgical conservative treatments for greater than 12 weeks to include NSAID's and/or analgesics, corticosteriod injections, flexibility and strengthening excercises, use of assistive devices, weight reduction as appropriate and activity modification.  Onset of symptoms was gradual starting 5 years ago with gradually worsening course since that time.The patient noted no past surgery on the right hip(s).  Patient currently rates pain in the right hip at 10 out of 10 with activity. Patient has night pain, worsening of pain with activity and weight bearing, trendelenberg gait, pain that interfers with activities of daily living and pain with passive range of motion. Patient has evidence of subchondral cysts, subchondral sclerosis, periarticular osteophytes and joint space narrowing by imaging studies. This condition presents safety issues increasing the risk of falls.  There is no current active infection.  Patient Active Problem List   Diagnosis Date Noted  . Unilateral primary osteoarthritis, 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  . Osteoarthritis of right hip 04/03/2019  . Collapsed arches 04/03/2019  . Hyperlipidemia 11/30/2018   Past Medical History:  Diagnosis Date  . Anemia   . Anxiety   . Arthritis   . Breast cancer ([place]) 1970s   right breast  . Depression   . Personal history of chemotherapy   . Personal history of radiation therapy     Past Surgical History:  Procedure Laterality Date  . ABDOMINAL HYSTERECTOMY    . BREAST LUMPECTOMY Right 1976  . CHOLECYSTECTOMY    . JOINT REPLACEMENT     L knee replacement  . TONSILLECTOMY    . WISDOM TOOTH EXTRACTION      No current facility-administered medications for this  encounter.   Current Outpatient Medications  Medication Sig Dispense Refill Last Dose  . MAGNESIUM GLUCONATE PO Take 120 mg by mouth in the morning and at bedtime.     . Melatonin 10 MG TABS Take 10 mg by mouth daily.     . Omega-3 1000 MG CAPS Take 1,000 mg by mouth daily. Biomega     . RESTASIS 0.05 % ophthalmic emulsion Place 1 drop into both eyes daily as needed (dry eye).      Marland Kitchen VITAMIN A PO Take 3,000 Units by mouth daily.     Marland Kitchen aspirin (ASPIRIN CHILDRENS) 81 MG chewable tablet Chew 1 tablet (81 mg total) by mouth 2 (two) times daily after a meal. 30 tablet 0   . Evolocumab (REPATHA SURECLICK) 295 MG/ML SOAJ Inject 140 mg into the skin every 14 (fourteen) days. (Patient not taking: Reported on 10/17/2019) 2 pen 11 Not Taking at Unknown time  . methocarbamol (ROBAXIN) 500 MG tablet Take 1 tablet (500 mg total) by mouth every 6 (six) hours as needed for muscle spasms. 40 tablet 1   . ondansetron (ZOFRAN ODT) 4 MG disintegrating tablet Take 1 tablet (4 mg total) by mouth every 8 (eight) hours as needed for nausea or vomiting. 20 tablet 0   . oxyCODONE (ROXICODONE) 5 MG immediate release tablet Take 1 tablet (5 mg total) by mouth every 4 (four) hours as needed for severe pain. 30 tablet 0   . traMADol (ULTRAM) 50 MG tablet Take 1-2 tablets (50-100 mg total) by mouth every 6 (six) hours as needed. (Patient not taking: Reported on  10/17/2019) 40 tablet 0 Not Taking at Unknown time   No Active Allergies  Social History   Tobacco Use  . Smoking status: Never Smoker  . Smokeless tobacco: Never Used  Substance Use Topics  . Alcohol use: Never    No family history on file.   Review of Systems  Musculoskeletal: Positive for gait problem.  All other systems reviewed and are negative.   Objective:  Physical Exam Vitals reviewed.  Constitutional:      Appearance: Normal appearance.  HENT:     Head: Normocephalic and atraumatic.  Eyes:     Extraocular Movements: Extraocular movements  intact.     Pupils: Pupils are equal, round, and reactive to light.  Cardiovascular:     Rate and Rhythm: Normal rate and regular rhythm.     Pulses: Normal pulses.     Heart sounds: Normal heart sounds.  Pulmonary:     Effort: Pulmonary effort is normal.     Breath sounds: Normal breath sounds.  Abdominal:     General: Bowel sounds are normal.     Palpations: Abdomen is soft.  Musculoskeletal:     Cervical back: Normal range of motion and neck supple.     Right hip: Tenderness and bony tenderness present. Decreased range of motion. Decreased strength.  Neurological:     Mental Status: She is alert and oriented to person, place, and time.  Psychiatric:        Behavior: Behavior normal.     Vital signs in last 24 hours:    Labs:   Estimated body mass index is 25.01 kg/m as calculated from the following:   Height as of 10/18/19: 5\' 5"  (1.651 m).   Weight as of 10/18/19: 68.2 kg.   Imaging Review Plain radiographs demonstrate severe degenerative joint disease of the right hip(s). The bone quality appears to be good for age and reported activity level.      Assessment/Plan:  End stage arthritis, right hip(s)  The patient history, physical examination, clinical judgement of the provider and imaging studies are consistent with end stage degenerative joint disease of the right hip(s) and total hip arthroplasty is deemed medically necessary. The treatment options including medical management, injection therapy, arthroscopy and arthroplasty were discussed at length. The risks and benefits of total hip arthroplasty were presented and reviewed. The risks due to aseptic loosening, infection, stiffness, dislocation/subluxation,  thromboembolic complications and other imponderables were discussed.  The patient acknowledged the explanation, agreed to proceed with the plan and consent was signed. Patient is being admitted for inpatient treatment for surgery, pain control, PT, OT,  prophylactic antibiotics, VTE prophylaxis, progressive ambulation and ADL's and discharge planning.The patient is planning to be discharged home with home health services    Patient's anticipated LOS is less than 2 midnights, meeting these requirements: - Younger than 33 - Lives within 1 hour of care - Has a competent adult at home to recover with post-op recover - NO history of  - Chronic pain requiring opiods  - Diabetes  - Coronary Artery Disease  - Heart failure  - Heart attack  - Stroke  - DVT/VTE  - Cardiac arrhythmia  - Respiratory Failure/COPD  - Renal failure  - Anemia  - Advanced Liver disease

## 2019-10-25 NOTE — Anesthesia Preprocedure Evaluation (Addendum)
Anesthesia Evaluation  Patient identified by MRN, date of birth, ID band Patient awake    Reviewed: Allergy & Precautions, NPO status , Patient's Chart, lab work & pertinent test results  History of Anesthesia Complications Negative for: history of anesthetic complications  Airway Mallampati: II  TM Distance: >3 FB Neck ROM: Full    Dental no notable dental hx. (+) Dental Advisory Given   Pulmonary neg pulmonary ROS,    Pulmonary exam normal        Cardiovascular negative cardio ROS Normal cardiovascular exam     Neuro/Psych PSYCHIATRIC DISORDERS Anxiety Depression negative neurological ROS     GI/Hepatic negative GI ROS, Neg liver ROS,   Endo/Other  negative endocrine ROS  Renal/GU negative Renal ROS     Musculoskeletal  (+) Arthritis ,   Abdominal   Peds  Hematology negative hematology ROS (+)   Anesthesia Other Findings Hx of Breast Ca  Reproductive/Obstetrics                            Anesthesia Physical Anesthesia Plan  ASA: II  Anesthesia Plan: Spinal   Post-op Pain Management:    Induction:   PONV Risk Score and Plan: 2 and Ondansetron and Propofol infusion  Airway Management Planned: Natural Airway and Simple Face Mask  Additional Equipment:   Intra-op Plan:   Post-operative Plan:   Informed Consent: I have reviewed the patients History and Physical, chart, labs and discussed the procedure including the risks, benefits and alternatives for the proposed anesthesia with the patient or authorized representative who has indicated his/her understanding and acceptance.     Dental advisory given  Plan Discussed with: Anesthesiologist and CRNA  Anesthesia Plan Comments:        Anesthesia Quick Evaluation

## 2019-10-26 ENCOUNTER — Ambulatory Visit (HOSPITAL_COMMUNITY): Payer: Medicare Other | Admitting: Anesthesiology

## 2019-10-26 ENCOUNTER — Encounter (HOSPITAL_COMMUNITY)
Admission: RE | Disposition: A | Discharge status: Home / Self Care | Payer: Self-pay | Source: Home / Self Care | Attending: Orthopaedic Surgery

## 2019-10-26 ENCOUNTER — Ambulatory Visit (HOSPITAL_COMMUNITY): Payer: Medicare Other

## 2019-10-26 ENCOUNTER — Encounter (HOSPITAL_COMMUNITY): Payer: Self-pay | Admitting: Orthopaedic Surgery

## 2019-10-26 ENCOUNTER — Ambulatory Visit (HOSPITAL_COMMUNITY)
Admission: RE | Admit: 2019-10-26 | Discharge: 2019-10-26 | Disposition: A | Discharge status: Home / Self Care | Payer: Medicare Other | Attending: Orthopaedic Surgery | Admitting: Orthopaedic Surgery

## 2019-10-26 DIAGNOSIS — M1611 Unilateral primary osteoarthritis, right hip: Secondary | ICD-10-CM

## 2019-10-26 DIAGNOSIS — Z96641 Presence of right artificial hip joint: Secondary | ICD-10-CM

## 2019-10-26 DIAGNOSIS — Z853 Personal history of malignant neoplasm of breast: Secondary | ICD-10-CM | POA: Diagnosis not present

## 2019-10-26 DIAGNOSIS — E785 Hyperlipidemia, unspecified: Secondary | ICD-10-CM | POA: Insufficient documentation

## 2019-10-26 DIAGNOSIS — Z79899 Other long term (current) drug therapy: Secondary | ICD-10-CM | POA: Insufficient documentation

## 2019-10-26 DIAGNOSIS — Z7982 Long term (current) use of aspirin: Secondary | ICD-10-CM | POA: Diagnosis not present

## 2019-10-26 DIAGNOSIS — Z419 Encounter for procedure for purposes other than remedying health state, unspecified: Secondary | ICD-10-CM

## 2019-10-26 HISTORY — PX: TOTAL HIP ARTHROPLASTY: SHX124

## 2019-10-26 LAB — TYPE AND SCREEN
ABO/RH(D): A POS
Antibody Screen: NEGATIVE

## 2019-10-26 LAB — ABO/RH: ABO/RH(D): A POS

## 2019-10-26 SURGERY — ARTHROPLASTY, HIP, TOTAL, ANTERIOR APPROACH
Anesthesia: Spinal | Site: Hip | Laterality: Right

## 2019-10-26 MED ORDER — DEXAMETHASONE SODIUM PHOSPHATE 10 MG/ML IJ SOLN
INTRAMUSCULAR | Status: AC
  Filled 2019-10-26: qty 1

## 2019-10-26 MED ORDER — 0.9 % SODIUM CHLORIDE (POUR BTL) OPTIME
TOPICAL | Status: DC | PRN
  Administered 2019-10-26: 1000 mL

## 2019-10-26 MED ORDER — SODIUM CHLORIDE 0.9 % IR SOLN
Status: DC | PRN
  Administered 2019-10-26: 1000 mL

## 2019-10-26 MED ORDER — PROPOFOL 500 MG/50ML IV EMUL
INTRAVENOUS | Status: DC | PRN
  Administered 2019-10-26: 100 ug/kg/min via INTRAVENOUS

## 2019-10-26 MED ORDER — POVIDONE-IODINE 10 % EX SWAB
2.0000 "application " | Freq: Once | CUTANEOUS | Status: AC
  Administered 2019-10-26: 2 via TOPICAL

## 2019-10-26 MED ORDER — METHOCARBAMOL 500 MG PO TABS: 500.0000 mg | ORAL_TABLET | Freq: Four times a day (QID) | ORAL | Status: DC | PRN

## 2019-10-26 MED ORDER — KETOROLAC TROMETHAMINE 15 MG/ML IJ SOLN
15.0000 mg | Freq: Once | INTRAMUSCULAR | Status: AC
  Administered 2019-10-26: 15 mg via INTRAVENOUS

## 2019-10-26 MED ORDER — METOCLOPRAMIDE HCL 5 MG/ML IJ SOLN: 5.0000 mg | Freq: Three times a day (TID) | INTRAMUSCULAR | Status: DC | PRN

## 2019-10-26 MED ORDER — METHOCARBAMOL 500 MG IVPB - SIMPLE MED
500.0000 mg | Freq: Four times a day (QID) | INTRAVENOUS | Status: DC | PRN
  Administered 2019-10-26: 500 mg via INTRAVENOUS

## 2019-10-26 MED ORDER — STERILE WATER FOR IRRIGATION IR SOLN
Status: DC | PRN
  Administered 2019-10-26: 2000 mL

## 2019-10-26 MED ORDER — ONDANSETRON HCL 4 MG/2ML IJ SOLN
INTRAMUSCULAR | Status: AC
  Filled 2019-10-26: qty 2

## 2019-10-26 MED ORDER — PROPOFOL 10 MG/ML IV BOLUS
INTRAVENOUS | Status: DC | PRN
  Administered 2019-10-26 (×2): 20 mg via INTRAVENOUS

## 2019-10-26 MED ORDER — CELECOXIB 200 MG PO CAPS
200.0000 mg | ORAL_CAPSULE | Freq: Once | ORAL | Status: AC
  Administered 2019-10-26: 200 mg via ORAL
  Filled 2019-10-26: qty 1

## 2019-10-26 MED ORDER — TRANEXAMIC ACID-NACL 1000-0.7 MG/100ML-% IV SOLN
1000.0000 mg | INTRAVENOUS | Status: AC
  Administered 2019-10-26: 1000 mg via INTRAVENOUS
  Filled 2019-10-26: qty 100

## 2019-10-26 MED ORDER — LIDOCAINE HCL (CARDIAC) PF 100 MG/5ML IV SOSY
PREFILLED_SYRINGE | INTRAVENOUS | Status: DC | PRN
  Administered 2019-10-26: 60 mg via INTRAVENOUS

## 2019-10-26 MED ORDER — FENTANYL CITRATE (PF) 100 MCG/2ML IJ SOLN
25.0000 ug | INTRAMUSCULAR | Status: DC | PRN
  Administered 2019-10-26: 25 ug via INTRAVENOUS
  Administered 2019-10-26: 50 ug via INTRAVENOUS

## 2019-10-26 MED ORDER — LACTATED RINGERS IV SOLN: INTRAVENOUS | Status: DC | PRN

## 2019-10-26 MED ORDER — ACETAMINOPHEN 325 MG PO TABS: 325.0000 mg | ORAL_TABLET | Freq: Four times a day (QID) | ORAL | Status: DC | PRN

## 2019-10-26 MED ORDER — LACTATED RINGERS IV BOLUS
250.0000 mL | Freq: Once | INTRAVENOUS | Status: AC
  Administered 2019-10-26: 250 mL via INTRAVENOUS

## 2019-10-26 MED ORDER — OXYCODONE HCL 5 MG PO TABS: 5.0000 mg | ORAL_TABLET | ORAL | Status: DC | PRN

## 2019-10-26 MED ORDER — CEFAZOLIN SODIUM-DEXTROSE 1-4 GM/50ML-% IV SOLN: 1.0000 g | Freq: Four times a day (QID) | INTRAVENOUS | Status: DC

## 2019-10-26 MED ORDER — FENTANYL CITRATE (PF) 100 MCG/2ML IJ SOLN
INTRAMUSCULAR | Status: AC
  Filled 2019-10-26: qty 2

## 2019-10-26 MED ORDER — ONDANSETRON HCL 4 MG/2ML IJ SOLN: 4.0000 mg | Freq: Four times a day (QID) | INTRAMUSCULAR | Status: DC | PRN

## 2019-10-26 MED ORDER — LACTATED RINGERS IV BOLUS
500.0000 mL | Freq: Once | INTRAVENOUS | Status: AC
  Administered 2019-10-26: 500 mL via INTRAVENOUS

## 2019-10-26 MED ORDER — EPHEDRINE 5 MG/ML INJ
INTRAVENOUS | Status: AC
  Filled 2019-10-26: qty 10

## 2019-10-26 MED ORDER — LACTATED RINGERS IV SOLN: INTRAVENOUS | Status: DC

## 2019-10-26 MED ORDER — EPHEDRINE SULFATE-NACL 50-0.9 MG/10ML-% IV SOSY
PREFILLED_SYRINGE | INTRAVENOUS | Status: DC | PRN
  Administered 2019-10-26 (×2): 5 mg via INTRAVENOUS
  Administered 2019-10-26: 10 mg via INTRAVENOUS

## 2019-10-26 MED ORDER — HYDROMORPHONE HCL 1 MG/ML IJ SOLN: 0.5000 mg | INTRAMUSCULAR | Status: DC | PRN

## 2019-10-26 MED ORDER — ONDANSETRON HCL 4 MG/2ML IJ SOLN
INTRAMUSCULAR | Status: DC | PRN
  Administered 2019-10-26: 4 mg via INTRAVENOUS

## 2019-10-26 MED ORDER — ACETAMINOPHEN 500 MG PO TABS
1000.0000 mg | ORAL_TABLET | Freq: Once | ORAL | Status: AC
  Administered 2019-10-26: 1000 mg via ORAL
  Filled 2019-10-26: qty 2

## 2019-10-26 MED ORDER — OXYCODONE HCL 5 MG PO TABS: 10.0000 mg | ORAL_TABLET | ORAL | Status: DC | PRN

## 2019-10-26 MED ORDER — ONDANSETRON HCL 4 MG PO TABS
4.0000 mg | ORAL_TABLET | Freq: Four times a day (QID) | ORAL | Status: DC | PRN
  Filled 2019-10-26: qty 1

## 2019-10-26 MED ORDER — METHOCARBAMOL 500 MG IVPB - SIMPLE MED
INTRAVENOUS | Status: AC
  Filled 2019-10-26: qty 50

## 2019-10-26 MED ORDER — DEXAMETHASONE SODIUM PHOSPHATE 10 MG/ML IJ SOLN
INTRAMUSCULAR | Status: DC | PRN
  Administered 2019-10-26: 4 mg via INTRAVENOUS

## 2019-10-26 MED ORDER — LIDOCAINE 2% (20 MG/ML) 5 ML SYRINGE
INTRAMUSCULAR | Status: AC
  Filled 2019-10-26: qty 5

## 2019-10-26 MED ORDER — CEFAZOLIN SODIUM-DEXTROSE 2-4 GM/100ML-% IV SOLN
2.0000 g | INTRAVENOUS | Status: AC
  Administered 2019-10-26: 2 g via INTRAVENOUS
  Filled 2019-10-26: qty 100

## 2019-10-26 MED ORDER — FENTANYL CITRATE (PF) 100 MCG/2ML IJ SOLN
INTRAMUSCULAR | Status: DC | PRN
Start: 2019-10-26 — End: 2019-10-26
  Administered 2019-10-26: 100 ug via INTRAVENOUS

## 2019-10-26 MED ORDER — HYDROMORPHONE HCL 1 MG/ML IJ SOLN
INTRAMUSCULAR | Status: AC
  Administered 2019-10-26: 0.5 mg via INTRAVENOUS
  Filled 2019-10-26: qty 1

## 2019-10-26 MED ORDER — FENTANYL CITRATE (PF) 100 MCG/2ML IJ SOLN
INTRAMUSCULAR | Status: AC
  Administered 2019-10-26: 25 ug via INTRAVENOUS
  Filled 2019-10-26: qty 2

## 2019-10-26 MED ORDER — ORAL CARE MOUTH RINSE: 15.0000 mL | Freq: Once | OROMUCOSAL | Status: AC

## 2019-10-26 MED ORDER — PROMETHAZINE HCL 25 MG/ML IJ SOLN: 6.2500 mg | INTRAMUSCULAR | Status: DC | PRN

## 2019-10-26 MED ORDER — KETOROLAC TROMETHAMINE 15 MG/ML IJ SOLN
INTRAMUSCULAR | Status: AC
  Filled 2019-10-26: qty 1

## 2019-10-26 MED ORDER — PHENYLEPHRINE HCL (PRESSORS) 10 MG/ML IV SOLN
INTRAVENOUS | Status: AC
  Filled 2019-10-26: qty 1

## 2019-10-26 MED ORDER — PHENYLEPHRINE 40 MCG/ML (10ML) SYRINGE FOR IV PUSH (FOR BLOOD PRESSURE SUPPORT)
PREFILLED_SYRINGE | INTRAVENOUS | Status: DC | PRN
  Administered 2019-10-26: 80 ug via INTRAVENOUS
  Administered 2019-10-26 (×5): 40 ug via INTRAVENOUS

## 2019-10-26 MED ORDER — CEFAZOLIN SODIUM-DEXTROSE 2-4 GM/100ML-% IV SOLN
INTRAVENOUS | Status: AC
  Administered 2019-10-26: 2000 mg
  Filled 2019-10-26: qty 100

## 2019-10-26 MED ORDER — PROPOFOL 1000 MG/100ML IV EMUL
INTRAVENOUS | Status: AC
  Filled 2019-10-26: qty 200

## 2019-10-26 MED ORDER — CHLORHEXIDINE GLUCONATE 0.12 % MT SOLN
15.0000 mL | Freq: Once | OROMUCOSAL | Status: AC
  Administered 2019-10-26: 15 mL via OROMUCOSAL

## 2019-10-26 MED ORDER — BUPIVACAINE IN DEXTROSE 0.75-8.25 % IT SOLN
INTRATHECAL | Status: DC | PRN
  Administered 2019-10-26: 1.6 mL via INTRATHECAL

## 2019-10-26 MED ORDER — METOCLOPRAMIDE HCL 5 MG PO TABS
5.0000 mg | ORAL_TABLET | Freq: Three times a day (TID) | ORAL | Status: DC | PRN
  Filled 2019-10-26: qty 2

## 2019-10-26 SURGICAL SUPPLY — 39 items
BAG ZIPLOCK 12X15 (MISCELLANEOUS) IMPLANT
BENZOIN TINCTURE PRP APPL 2/3 (GAUZE/BANDAGES/DRESSINGS) IMPLANT
BLADE SAW SGTL 18X1.27X75 (BLADE) ×2 IMPLANT
BLADE SAW SGTL 18X1.27X75MM (BLADE) ×1
CLOSURE WOUND 1/2 X4 (GAUZE/BANDAGES/DRESSINGS)
COVER PERINEAL POST (MISCELLANEOUS) ×3 IMPLANT
COVER SURGICAL LIGHT HANDLE (MISCELLANEOUS) ×3 IMPLANT
COVER WAND RF STERILE (DRAPES) ×3 IMPLANT
CUP SECTOR GRIPTON 50MM (Cup) ×3 IMPLANT
DRAPE STERI IOBAN 125X83 (DRAPES) ×3 IMPLANT
DRAPE U-SHAPE 47X51 STRL (DRAPES) ×6 IMPLANT
DRSG AQUACEL AG ADV 3.5X10 (GAUZE/BANDAGES/DRESSINGS) ×3 IMPLANT
DURAPREP 26ML APPLICATOR (WOUND CARE) ×3 IMPLANT
ELECT REM PT RETURN 15FT ADLT (MISCELLANEOUS) ×3 IMPLANT
GAUZE XEROFORM 1X8 LF (GAUZE/BANDAGES/DRESSINGS) ×3 IMPLANT
GLOVE BIO SURGEON STRL SZ7.5 (GLOVE) ×3 IMPLANT
GLOVE BIOGEL PI IND STRL 8 (GLOVE) ×2 IMPLANT
GLOVE BIOGEL PI INDICATOR 8 (GLOVE) ×4
GLOVE ECLIPSE 8.0 STRL XLNG CF (GLOVE) ×3 IMPLANT
GOWN STRL REUS W/TWL XL LVL3 (GOWN DISPOSABLE) ×6 IMPLANT
HANDPIECE INTERPULSE COAX TIP (DISPOSABLE) ×2
HEAD FEM STD 32X+1 STRL (Hips) ×3 IMPLANT
HOLDER FOLEY CATH W/STRAP (MISCELLANEOUS) IMPLANT
KIT TURNOVER KIT A (KITS) IMPLANT
LINER ACETABULAR 32X50 (Liner) ×3 IMPLANT
PACK ANTERIOR HIP CUSTOM (KITS) ×3 IMPLANT
PENCIL SMOKE EVACUATOR (MISCELLANEOUS) IMPLANT
SET HNDPC FAN SPRY TIP SCT (DISPOSABLE) ×1 IMPLANT
STAPLER VISISTAT 35W (STAPLE) ×3 IMPLANT
STEM CORAIL KA11 (Stem) ×3 IMPLANT
STRIP CLOSURE SKIN 1/2X4 (GAUZE/BANDAGES/DRESSINGS) IMPLANT
SUT ETHIBOND NAB CT1 #1 30IN (SUTURE) ×6 IMPLANT
SUT ETHILON 2 0 PS N (SUTURE) IMPLANT
SUT MNCRL AB 4-0 PS2 18 (SUTURE) IMPLANT
SUT VIC AB 0 CT1 36 (SUTURE) ×3 IMPLANT
SUT VIC AB 1 CT1 36 (SUTURE) ×3 IMPLANT
SUT VIC AB 2-0 CT1 27 (SUTURE) ×4
SUT VIC AB 2-0 CT1 TAPERPNT 27 (SUTURE) ×2 IMPLANT
TRAY FOLEY MTR SLVR 16FR STAT (SET/KITS/TRAYS/PACK) IMPLANT

## 2019-10-26 NOTE — Anesthesia Procedure Notes (Signed)
Spinal  Patient location during procedure: OR Start time: 10/26/2019 7:19 AM End time: 10/26/2019 7:29 AM Staffing Performed: anesthesiologist  Anesthesiologist: Duane Boston, MD Preanesthetic Checklist Completed: patient identified, IV checked, risks and benefits discussed, surgical consent, monitors and equipment checked, pre-op evaluation and timeout performed Spinal Block Patient position: sitting Prep: DuraPrep Patient monitoring: cardiac monitor, continuous pulse ox and blood pressure Approach: midline Location: L2-3 Injection technique: single-shot Needle Needle type: Pencan  Needle gauge: 24 G Needle length: 9 cm Additional Notes Functioning IV was confirmed and monitors were applied. Sterile prep and drape, including hand hygiene and sterile gloves were used. The patient was positioned and the spine was prepped. The skin was anesthetized with lidocaine.  Free flow of clear CSF was obtained prior to injecting local anesthetic into the CSF.  The spinal needle aspirated freely following injection.  The needle was carefully withdrawn.  The patient tolerated the procedure well.

## 2019-10-26 NOTE — Discharge Instructions (Signed)

## 2019-10-26 NOTE — Op Note (Signed)
NAME: Jodi Summers, NUNCIO MEDICAL RECORD BH:[id number] ACCOUNT [id number] DATE OF BIRTH:1940/03/12 FACILITY: [facility] LOCATION: [facility] PHYSICIAN:Kattaleya Alia Kerry Fort, MD  OPERATIVE REPORT  DATE OF PROCEDURE:  10/26/2019  PREOPERATIVE DIAGNOSIS:  Primary osteoarthritis and degenerative joint disease, right hip.  POSTOPERATIVE DIAGNOSIS:  Primary osteoarthritis and degenerative joint disease, right hip.  PROCEDURE:  Right total hip arthroplasty through direct anterior approach.  IMPLANTS:  DePuy Sector Gription acetabular component size 50, size 32+0 neutral polyethylene liner, size 11 Corail femoral component with standard offset, size 32+1 metal hip ball.  SURGEON:  Lind Guest.  Ninfa Linden, MD  ASSISTANT:  Erskine Emery, PA-C.  ANESTHESIA:  Spinal.  ESTIMATED BLOOD LOSS:  300 mL.  ANTIBIOTICS:  Two g IV Ancef.  COMPLICATIONS:  None.  INDICATIONS:  The patient is a 79 year old active female with debilitating arthritis involving her right hip.  This has been well documented with x-rays and clinical exam.  At this point, her right hip pain is daily and it is detrimentally affecting her  mobility, her quality of life, and her activities of daily living.  At this point, she does wish to proceed with a total hip arthroplasty through direct anterior approach.  We had a long and thorough discussion about the risk of acute blood loss anemia,  nerve or vessel injury, infection, dislocation, DVT and implant failure, as well as skin and soft tissue issues.  We talked about our goals being decreased pain, improved mobility and overall improved quality of life.  DESCRIPTION OF PROCEDURE:  After informed consent was obtained, the  appropriate right hip was marked.  She was brought to the operating room and sat up on a stretcher where spinal anesthesia was then obtained.  She was laid in supine position on a  stretcher.  I assessed her leg lengths and definitely found her  significantly short on  the right operative side comparing right and left sides.  Traction boots were placed on both her feet.  Then,  she was placed supine on the Hana fracture table with a  perineal post in place and both legs in in-line skeletal traction device and no traction applied.  Her right operative hip was prepped and draped with DuraPrep and sterile drapes.  A time-out was called and she correct patient, correct right hip.  We  then made an incision just inferior and posterior to the anterior superior iliac spine and carried this obliquely down the leg.  We dissected down to the tensor fascia lata muscle.  Tensor fascia was then divided longitudinally to proceed with direct  anterior approach to the hip.  We identified and cauterized circumflex vessels and identified the hip capsule, opened up the hip capsule in an L-type format, finding a moderate joint effusion and significant periarticular osteophytes around the lateral  femoral head and neck.  I placed Cobra retractors around the medial and lateral femoral neck.  Within the joint capsule, we made our femoral neck cut with an oscillating saw just proximal to the lesser trochanter, completing this with an osteotome.  We  placed a corkscrew guide in the femoral head and removed the femoral head in its entirety.  We found a wide area devoid of cartilage,.  I placed a bent Hohmann over the medial acetabular rim and removed remnants of the acetabular labrum and other debris.   I then began reaming under direct visualization from a size 43 reamer in stepwise increments going up to a size 49, with all reamers placed under direct visualization, the last  reamer was placed under direct fluoroscopy so I could obtain my depth of  reaming, my inclination and anteversion.  I then placed the real DePuy Sector Gription acetabular component size 50 and a 32+0 neutral polyethylene liner based on her offset and positioning of the cup.  Attention was then turned to the femur.  With the   leg externally rotated to 120 degrees, extended and adducted, we were able to place a Mueller retractor medially and a Hohmann retractor behind the greater trochanter.  I released the lateral joint capsule and used a box-cutting osteotome to enter the  femoral canal and a rongeur to lateralize. We then began broaching using the Corail broaching system from a size 8 going up to a size 11.  With the size 11 in place, we trialed a standard offset femoral neck and a 32+1 hip ball, reduced this in the  acetabulum and we were pleased with range of motion, offset and stability assessed radiographically and clinically.  We then dislocated the hip and removed the trial components.  We placed the real Corail femoral component with standard offset size 11  and the real 32+1 metal hip ball and again reduced this in the acetabulum and we appreciated the stability.  We then assessed again radiographically.  We irrigated the soft tissue with normal saline solution using pulsatile lavage.  We dried the hip real  well and then closed the joint capsule with interrupted #1 Ethibond suture.  A #1 Vicryl was used to close the tensor fascia, 0 Vicryl was used to close deep tissue, 2-0 Vicryl was used to close the subcutaneous tissue.  The skin was reapproximated with  staples.  Xeroform and an Aquacel dressing was applied.  The patient had an in and out catheter performed with a Foley in the bladder and then was taken off the Hana table and taken to the recovery room in stable condition with all final counts being  correct.  No complications noted.  Of  note, Benita Stabile, PA-C, assisted during the entire case and his assistance was crucial for facilitating all aspects of this case.  VN/NUANCE  D:10/26/2019 T:10/26/2019 JOB:[id number]

## 2019-10-26 NOTE — Progress Notes (Signed)
Pt discharged in NAD, VSS, pain tolerable. Pt discharged with walker and bedside commode. Pt and daughter given discharge instructions, instructions understood. Pt discharged home with daughter.

## 2019-10-26 NOTE — Care Plan (Signed)
RNCM from Orthopedic office call to patient's daughter yesterday to discuss her upcoming Right total hip arthroplasty with Dr. Ninfa Linden today. Daughter is primary contact for her mother. She is aware that her mother will be an ambulatory surgery and will be discharged same day. Reviewed all post-op instructions and allowed for questions. Patient will be going to her daughter's home after discharge for recovery. She will need a FWW as well as 3in1/BSC. These have been ordered through [facility] to be delivered to hospital prior to discharge. HHPT has been arranged with [facility]. Choice provided. Will continue to assist with any needs during hospital stay and after. Jamse Arn, RN Case Manager [phone].

## 2019-10-26 NOTE — Brief Op Note (Signed)
10/26/2019  8:29 AM  PATIENT:  Jodi Summers  79 y.o. female  PRE-OPERATIVE DIAGNOSIS:  osteoarthritis right hip  POST-OPERATIVE DIAGNOSIS:  osteoarthritis right hip  PROCEDURE:  Procedure(s): RIGHT TOTAL HIP ARTHROPLASTY ANTERIOR APPROACH (Right)  SURGEON:  Surgeon(s) and Role:    Mcarthur Rossetti, MD - Primary  PHYSICIAN ASSISTANT:  Benita Stabile, PA-C  ANESTHESIA:   spinal  EBL:  300 mL   COUNTS:  YES  DICTATION: .Other Dictation: Dictation Number [id number]  PLAN OF CARE: Discharge to home after PACU  PATIENT DISPOSITION:  PACU - hemodynamically stable.   Delay start of Pharmacological VTE agent (>24hrs) due to surgical blood loss or risk of bleeding: no

## 2019-10-26 NOTE — Evaluation (Signed)
Physical Therapy Evaluation Patient Details Name: Jodi Summers MRN: [id number] DOB: Nov 14, 1940 Today's Date: 10/26/2019   History of Present Illness  Patient is 79 y.o. female s/p Rt THA anterior approach on 10/26/19 with PMH significant for depression, anxiety, OA, anemia, breast cancer.     Clinical Impression  Jodi Summers is a 79 y.o. female POD 0 s/p Rt THA. Patient reports independence with [facility] for mobility at baseline. Patient is now limited by functional impairments (see PT problem list below) and requires min assist for transfers and gait with RW. Patient was able to ambulate ~160 feet with RW and min guard/supervision and cues for safe walker management. Patient educated on safe sequencing for stair mobility and verbalized safe guarding position for people assisting with mobility. Patients daughter present for session and provided safe guarding technique for gait and stairs. Patient instructed in exercises to facilitate ROM and circulation. Patient will benefit from continued skilled PT interventions to address impairments and progress towards PLOF. Patient has met mobility goals at adequate level for discharge home; will continue to follow if pt continues acute stay to progress towards Mod I goals.     Follow Up Recommendations Follow surgeon's recommendation for DC plan and follow-up therapies;Home health PT    Equipment Recommendations  Rolling walker with 5" wheels;3in1 (PT) (delivered in PACU)    Recommendations for Other Services       Precautions / Restrictions Precautions Precautions: Fall Restrictions Weight Bearing Restrictions: No Other Position/Activity Restrictions: WBAT      Mobility  Bed Mobility Overal bed mobility: Needs Assistance Bed Mobility: Supine to Sit     Supine to sit: Min guard     General bed mobility comments: pt requires increased time to sit up EOB no physical assist required, guarding for safety.  Transfers Overall transfer level:  Needs assistance Equipment used: Rolling walker (2 wheeled) Transfers: Sit to/from Stand Sit to Stand: Min guard         General transfer comment: cues for safe technique with RW, no overt LOB noted with rise. pt required min guard for safety.  Ambulation/Gait Ambulation/Gait assistance: Min Gaffer (Feet): 160 Feet Assistive device: Rolling walker (2 wheeled) Gait Pattern/deviations: Step-to pattern;Decreased stride length;Step-through pattern Gait velocity: decr   General Gait Details: cues for safe step pattern and proximity to RW. Pt's daughter educated on safe guarding and provided it during gait.  Stairs Stairs: Yes Stairs assistance: Min guard Stair Management: No rails;Step to pattern;Forwards;With walker Number of Stairs: 1 General stair comments: cues for safe step pattern and sequencing "Up with good, down with bad" for singel step negotiation. Pt's daughter provided guarding. Educated daughter on safe reverse step up pattern for car transfer.  Wheelchair Mobility    Modified Rankin (Stroke Patients Only)       Balance Overall balance assessment: Needs assistance Sitting-balance support: Feet supported Sitting balance-Leahy Scale: Good     Standing balance support: During functional activity;Bilateral upper extremity supported Standing balance-Leahy Scale: Fair            Pertinent Vitals/Pain Pain Assessment: 0-10 Pain Score: 9  Pain Location: Rt hip Pain Descriptors / Indicators: Discomfort Pain Intervention(s): Limited activity within patient's tolerance;Monitored during session;Repositioned    Home Living Family/patient expects to be discharged to:: Private residence Living Arrangements: Alone;Children Available Help at Discharge: Family Type of Home: House Home Access: Stairs to enter Entrance Stairs-Rails: None Entrance Stairs-Number of Steps: 1 Home Layout: One level;Able to live on main level with  bedroom/bathroom;Full bath on main level Home Equipment: Cane - single point Additional Comments: pt lives at [place] typically in her own apartment and is independent. She is going to stay with her daughter for at least 1 week.    Prior Function Level of Independence: Independent with assistive device(s)         Comments: pt has been using SPC for mobility due to hip pain.     Hand Dominance   Dominant Hand: Right    Extremity/Trunk Assessment   Upper Extremity Assessment Upper Extremity Assessment: Overall WFL for tasks assessed    Lower Extremity Assessment Lower Extremity Assessment: Overall WFL for tasks assessed    Cervical / Trunk Assessment Cervical / Trunk Assessment: Normal  Communication   Communication: No difficulties  Cognition Arousal/Alertness: Awake/alert Behavior During Therapy: WFL for tasks assessed/performed Overall Cognitive Status: Within Functional Limits for tasks assessed    General Comments: daughter present and confirms some memory impairment, no diagnosis in chart.       General Comments      Exercises Total Joint Exercises Ankle Circles/Pumps: AROM;Both;5 reps;Seated Quad Sets: AROM;Both;Seated;5 reps Heel Slides: AROM;Right;Other reps (comment);Seated (2) Hip ABduction/ADduction: AROM;Right;Seated;Other reps (comment) (2)   Assessment/Plan    PT Assessment Patient needs continued PT services  PT Problem List Decreased strength;Decreased range of motion;Decreased activity tolerance;Decreased balance;Decreased mobility;Decreased cognition;Decreased knowledge of use of DME;Decreased safety awareness;Decreased knowledge of precautions       PT Treatment Interventions DME instruction;Gait training;Stair training;Functional mobility training;Therapeutic activities;Balance training;Therapeutic exercise;Patient/family education    PT Goals (Current goals can be found in the Care Plan section)  Acute Rehab PT Goals Patient Stated Goal: get  home and stop using the cane PT Goal Formulation: With patient Time For Goal Achievement: 11/02/19 Potential to Achieve Goals: Good    Frequency 7X/week   Barriers to discharge           AM-PAC PT "6 Clicks" Mobility  Outcome Measure Help needed turning from your back to your side while in a flat bed without using bedrails?: A Little Help needed moving from lying on your back to sitting on the side of a flat bed without using bedrails?: A Little Help needed moving to and from a bed to a chair (including a wheelchair)?: A Little Help needed standing up from a chair using your arms (e.g., wheelchair or bedside chair)?: A Little Help needed to walk in hospital room?: A Little Help needed climbing 3-5 steps with a railing? : A Little 6 Click Score: 18    End of Session Equipment Utilized During Treatment: Gait belt Activity Tolerance: Patient tolerated treatment well Patient left: in chair;with call bell/phone within reach;with family/visitor present Nurse Communication: Mobility status PT Visit Diagnosis: Muscle weakness (generalized) (M62.81);Difficulty in walking, not elsewhere classified (R26.2)    Time: 1572-6203 PT Time Calculation (min) (ACUTE ONLY): 43 min   Charges:   PT Evaluation $PT Eval Low Complexity: 1 Low PT Treatments $Gait Training: 8-22 mins $Therapeutic Exercise: 8-22 mins      Verner Mould, DPT Acute Rehabilitation Services  Office [phone] Pager [phone]  10/26/2019 4:03 PM

## 2019-10-26 NOTE — Transfer of Care (Signed)
Immediate Anesthesia Transfer of Care Note  Patient: Jodi Summers  Procedure(s) Performed: RIGHT TOTAL HIP ARTHROPLASTY ANTERIOR APPROACH (Right Hip)  Patient Location: PACU  Anesthesia Type:Spinal  Level of Consciousness: drowsy, patient cooperative and responds to stimulation  Airway & Oxygen Therapy: Patient Spontanous Breathing and Patient connected to face mask oxygen  Post-op Assessment: Report given to RN and Post -op Vital signs reviewed and stable  Post vital signs: Reviewed and stable  Last Vitals:  Vitals Value Taken Time  BP 120/68 10/26/19 0849  Temp    Pulse 82 10/26/19 0851  Resp 15 10/26/19 0851  SpO2 100 % 10/26/19 0851  Vitals shown include unvalidated device data.  Last Pain:  Vitals:   10/26/19 0634  TempSrc: Oral  PainSc:          Complications: No complications documented.

## 2019-10-26 NOTE — Interval H&P Note (Signed)
History and Physical Interval Note:  The patient understands fully that she is here today for a right total hip replacement to treat her right hip arthritis.  There has been no interval change in her health status - see recent H&P.  The risks and benefits of surgery has been discussed and informed consent is obtained.  The right hip has been marked.  10/26/2019 7:03 AM  Maury D Constance Haw  has presented today for surgery, with the diagnosis of osteoarthritis right hip.  The various methods of treatment have been discussed with the patient and family. After consideration of risks, benefits and other options for treatment, the patient has consented to  Procedure(s): RIGHT TOTAL HIP ARTHROPLASTY ANTERIOR APPROACH (Right) as a surgical intervention.  The patient's history has been reviewed, patient examined, no change in status, stable for surgery.  I have reviewed the patient's chart and labs.  Questions were answered to the patient's satisfaction.     Mcarthur Rossetti

## 2019-10-26 NOTE — Anesthesia Postprocedure Evaluation (Signed)
Anesthesia Post Note  Patient: Jodi Summers  Procedure(s) Performed: RIGHT TOTAL HIP ARTHROPLASTY ANTERIOR APPROACH (Right Hip)     Patient location during evaluation: PACU Anesthesia Type: Spinal Level of consciousness: awake and alert Pain management: pain level controlled Vital Signs Assessment: post-procedure vital signs reviewed and stable Respiratory status: spontaneous breathing and respiratory function stable Cardiovascular status: blood pressure returned to baseline and stable Postop Assessment: spinal receding Anesthetic complications: no   No complications documented.  Last Vitals:  Vitals:   10/26/19 1000 10/26/19 1015  BP: 127/65 134/67  Pulse: 71 67  Resp: (!) 8 16  Temp: (!) 36.2 C   SpO2: 99% 100%                 Nafisah Runions DANIEL

## 2019-10-27 ENCOUNTER — Other Ambulatory Visit: Payer: Self-pay | Admitting: Specialist

## 2019-10-27 MED ORDER — TRAMADOL HCL 50 MG PO TABS: 100.0000 mg | ORAL_TABLET | Freq: Four times a day (QID) | ORAL | 0 refills | Status: DC | PRN

## 2019-10-29 ENCOUNTER — Encounter (HOSPITAL_COMMUNITY): Payer: Self-pay | Admitting: Orthopaedic Surgery

## 2019-10-31 ENCOUNTER — Telehealth: Payer: Self-pay | Admitting: Orthopaedic Surgery

## 2019-10-31 NOTE — Telephone Encounter (Signed)
Physical therapist Farrel Gordon called requesting a call back as soon as possible. Coralyn Mark states that daughter is help caring for mother but patient is still complaining of severe pains, dizziness, and light headedness. Coralyn Mark states daughter has caught mother from falling a few time. Coralyn Mark states she has taken patient blood pressure. Pt current pressure sitting down is 135/70 and standing up is 88/55. Please call Coralyn Mark right away and pt daughter [name] with instructions. Terry phone number is [phone] and pt daughter [name] is [phone].

## 2019-11-08 ENCOUNTER — Encounter: Payer: Self-pay | Admitting: Physician Assistant

## 2019-11-08 ENCOUNTER — Ambulatory Visit (INDEPENDENT_AMBULATORY_CARE_PROVIDER_SITE_OTHER): Payer: Medicare Other | Admitting: Physician Assistant

## 2019-11-08 ENCOUNTER — Telehealth: Payer: Self-pay | Admitting: *Deleted

## 2019-11-08 DIAGNOSIS — Z96641 Presence of right artificial hip joint: Secondary | ICD-10-CM

## 2019-11-08 NOTE — Progress Notes (Signed)
HPI: Ms. Paden returns today 2 weeks status post right total hip arthroplasty.  She states that she is overall doing well.  She is ambulating with a cane.  She is taking Advil and Tylenol for her pain.  She remains on aspirin for DVT prophylaxis.  She has had no shortness of breath fever chills.   Physical exam: Right hip surgical incisions well approximated with staples no signs of wound dehiscence or infection.  Slight seroma.  This aspirated 20 cc of serosanguineous fluids obtained.  Right calf supple nontender.  Dorsiflexion plantarflexion right ankle intact.  Impression: Status post right total hip arthroplasty 10/26/2019  Plan: She will work on scar tissue mobilization.  Continue aspirin once daily 81 mg for another week and then discontinue as she is on no aspirin prior to surgery.  See her back in 1 month sooner if there is any questions concerns.  Questions were encouraged and answered both the patient and her daughter who is present throughout the examination today.

## 2019-11-08 NOTE — Telephone Encounter (Signed)
A message was left, re: follow up visit. 

## 2019-11-22 ENCOUNTER — Telehealth: Payer: Self-pay | Admitting: Cardiovascular Disease

## 2019-11-22 NOTE — Telephone Encounter (Signed)
lvm for patient to return call to get follow up scheduled with [facility] from recall list °

## 2019-11-28 ENCOUNTER — Emergency Department (HOSPITAL_COMMUNITY): Payer: Medicare Other

## 2019-11-28 ENCOUNTER — Other Ambulatory Visit: Payer: Self-pay

## 2019-11-28 ENCOUNTER — Emergency Department (HOSPITAL_COMMUNITY)
Admission: EM | Admit: 2019-11-28 | Discharge: 2019-11-29 | Disposition: A | Discharge status: Home / Self Care | Payer: Medicare Other | Attending: Emergency Medicine | Admitting: Emergency Medicine

## 2019-11-28 DIAGNOSIS — Z96641 Presence of right artificial hip joint: Secondary | ICD-10-CM | POA: Diagnosis not present

## 2019-11-28 DIAGNOSIS — Z96652 Presence of left artificial knee joint: Secondary | ICD-10-CM | POA: Insufficient documentation

## 2019-11-28 DIAGNOSIS — R55 Syncope and collapse: Secondary | ICD-10-CM | POA: Insufficient documentation

## 2019-11-28 DIAGNOSIS — Z7982 Long term (current) use of aspirin: Secondary | ICD-10-CM | POA: Diagnosis not present

## 2019-11-28 DIAGNOSIS — Z853 Personal history of malignant neoplasm of breast: Secondary | ICD-10-CM | POA: Diagnosis not present

## 2019-11-28 LAB — CBC
HCT: 36.8 % (ref 36.0–46.0)
Hemoglobin: 11.7 g/dL — ABNORMAL LOW (ref 12.0–15.0)
MCH: 29.3 pg (ref 26.0–34.0)
MCHC: 31.8 g/dL (ref 30.0–36.0)
MCV: 92 fL (ref 80.0–100.0)
Platelets: 241 10*3/uL (ref 150–400)
RBC: 4 MIL/uL (ref 3.87–5.11)
RDW: 12.7 % (ref 11.5–15.5)
WBC: 7.6 10*3/uL (ref 4.0–10.5)
nRBC: 0 % (ref 0.0–0.2)

## 2019-11-28 LAB — BASIC METABOLIC PANEL
Anion gap: 10 (ref 5–15)
BUN: 10 mg/dL (ref 8–23)
CO2: 26 mmol/L (ref 22–32)
Calcium: 9.3 mg/dL (ref 8.9–10.3)
Chloride: 102 mmol/L (ref 98–111)
Creatinine, Ser: 0.94 mg/dL (ref 0.44–1.00)
GFR, Estimated: 59 mL/min — ABNORMAL LOW (ref >60)
Glucose, Bld: 99 mg/dL (ref 70–99)
Potassium: 3.5 mmol/L (ref 3.5–5.1)
Sodium: 138 mmol/L (ref 135–145)

## 2019-11-28 LAB — CBG MONITORING, ED: Glucose-Capillary: 95 mg/dL (ref 70–99)

## 2019-11-28 NOTE — ED Triage Notes (Signed)
Pt here via EMS for eval of syncopal episode while playing bingo with friends while sitting in the chair. No fall, no injury. Friends report she was unconscious for 10 mins, was incontinent of urine, and had twitching in the L side of her face. Has not been able to sleep well in three weeks since her hip surgery.

## 2019-11-29 DIAGNOSIS — R55 Syncope and collapse: Secondary | ICD-10-CM | POA: Diagnosis not present

## 2019-11-29 LAB — URINALYSIS, ROUTINE W REFLEX MICROSCOPIC
Bilirubin Urine: NEGATIVE
Glucose, UA: NEGATIVE mg/dL
Hgb urine dipstick: NEGATIVE
Ketones, ur: NEGATIVE mg/dL
Leukocytes,Ua: NEGATIVE
Nitrite: NEGATIVE
Protein, ur: NEGATIVE mg/dL
Specific Gravity, Urine: 1.004 — ABNORMAL LOW (ref 1.005–1.030)
pH: 6 (ref 5.0–8.0)

## 2019-11-29 LAB — TROPONIN I (HIGH SENSITIVITY)
Troponin I (High Sensitivity): 4 ng/L (ref <18)
Troponin I (High Sensitivity): 4 ng/L (ref <18)

## 2019-11-29 NOTE — ED Notes (Signed)
Discharge instructions discussed with pt. Pt verbalized understanding. Pt stable and ambulatory. No signature pad available. 

## 2019-11-29 NOTE — ED Provider Notes (Signed)
[facility] EMERGENCY DEPARTMENT Provider Note  CSN: [id number] Arrival date & time: 11/28/19 1533  Chief Complaint(s) Loss of Consciousness  HPI Jodi Summers is a 79 y.o. female with a past medical history listed below who presents to the emergency department for syncopal episode at her independent living facility while playing bingo.  Incident was witnessed.  There was reported patient was unconscious for about 10 minutes.  They reported facial twitching and incontinence.  However patient and family denied any incontinence.  They report that the patient has had several bouts of syncope due to low blood pressures related to pain medication, prescribed for right hip replacement recently.  Patient reports feeling lightheaded just prior to the incident.  No chest pain or shortness of breath.  No lower extremity swelling.  No prior history of DVTs or PEs.  Patient denies any recent fevers or infections.  No coughing or congestion.  No vomiting or diarrhea.  No urinary symptoms.  Since the incident the patient has been feeling well.  She has been in our waiting room for approximately 9 to 10 hours due to the prolonged wait times.  HPI  Past Medical History Past Medical History:  Diagnosis Date  . Anemia   . Anxiety   . Arthritis   . Breast cancer ([place]) 1970s   right breast  . Depression   . Personal history of chemotherapy   . Personal history of radiation therapy    Patient Active Problem List   Diagnosis Date Noted  . Unilateral primary osteoarthritis, 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  . Osteoarthritis of right hip 04/03/2019  . Collapsed arches 04/03/2019  . Hyperlipidemia 11/30/2018   Home Medication(s) Prior to Admission medications   Medication Sig Start Date End Date Taking? Authorizing Provider  aspirin (ASPIRIN CHILDRENS) 81 MG chewable tablet Chew 1 tablet (81 mg total) by mouth 2 (two) times daily after a meal. 10/25/19   Mcarthur Rossetti, MD  buPROPion  (WELLBUTRIN XL) 150 MG 24 hr tablet  10/06/19   [provider]  MAGNESIUM GLUCONATE PO Take 120 mg by mouth in the morning and at bedtime.    [provider]  Melatonin 10 MG TABS Take 10 mg by mouth daily.    [provider]  methocarbamol (ROBAXIN) 500 MG tablet Take 1 tablet (500 mg total) by mouth every 6 (six) hours as needed for muscle spasms. 10/25/19   Mcarthur Rossetti, MD  Omega-3 1000 MG CAPS Take 1,000 mg by mouth daily. Biomega    [provider]  ondansetron (ZOFRAN ODT) 4 MG disintegrating tablet Take 1 tablet (4 mg total) by mouth every 8 (eight) hours as needed for nausea or vomiting. 10/25/19   Mcarthur Rossetti, MD  RESTASIS 0.05 % ophthalmic emulsion Place 1 drop into both eyes daily as needed (dry eye).  08/04/14   [provider]  VITAMIN A PO Take 3,000 Units by mouth daily.    [provider]  Past Surgical History Past Surgical History:  Procedure Laterality Date  . ABDOMINAL HYSTERECTOMY    . BREAST LUMPECTOMY Right 1976  . CHOLECYSTECTOMY    . JOINT REPLACEMENT     L knee replacement  . TONSILLECTOMY    . TOTAL HIP ARTHROPLASTY Right 10/26/2019   Procedure: RIGHT TOTAL HIP ARTHROPLASTY ANTERIOR APPROACH;  Surgeon: Mcarthur Rossetti, MD;  Location: [facility];  Service: Orthopedics;  Laterality: Right;  . WISDOM TOOTH EXTRACTION     Family History No family history on file.  Social History Social History   Tobacco Use  . Smoking status: Never Smoker  . Smokeless tobacco: Never Used  Vaping Use  . Vaping Use: Never used  Substance Use Topics  . Alcohol use: Never  . Drug use: Never   Allergies Patient has no active allergies.  Review of Systems Review of Systems All other systems are reviewed and are negative for acute change except as noted in the  HPI  Physical Exam Vital Signs  I have reviewed the triage vital signs BP 136/83   Pulse 72   Temp 98.1 F (36.7 C)   Resp 16   Ht 5' 5.5" (1.664 m)   Wt 65.8 kg   SpO2 98%   BMI 23.76 kg/m   Physical Exam Vitals reviewed.  Constitutional:      General: She is not in acute distress.    Appearance: She is well-developed. She is not diaphoretic.  HENT:     Head: Normocephalic and atraumatic.     Nose: Nose normal.  Eyes:     General: No scleral icterus.       Right eye: No discharge.        Left eye: No discharge.     Conjunctiva/sclera: Conjunctivae normal.     Pupils: Pupils are equal, round, and reactive to light.  Cardiovascular:     Rate and Rhythm: Normal rate and regular rhythm.     Heart sounds: No murmur heard.  No friction rub. No gallop.   Pulmonary:     Effort: Pulmonary effort is normal. No respiratory distress.     Breath sounds: Normal breath sounds. No stridor. No rales.  Abdominal:     General: There is no distension.     Palpations: Abdomen is soft.     Tenderness: There is no abdominal tenderness.  Musculoskeletal:        General: No tenderness.     Cervical back: Normal range of motion and neck supple.  Skin:    General: Skin is warm and dry.     Findings: No erythema or rash.  Neurological:     Mental Status: She is alert and oriented to person, place, and time.     ED Results and Treatments Labs (all labs ordered are listed, but only abnormal results are displayed) Labs Reviewed  BASIC METABOLIC PANEL - Abnormal; Notable for the following components:      Result Value   GFR, Estimated 59 (*)    All other components within normal limits  CBC - Abnormal; Notable for the following components:   Hemoglobin 11.7 (*)    All other components within normal limits  URINALYSIS, ROUTINE W REFLEX MICROSCOPIC - Abnormal; Notable for the following components:   Color, Urine STRAW (*)    Specific Gravity, Urine 1.004 (*)    All other components  within normal limits  CBG MONITORING, ED  TROPONIN I (HIGH SENSITIVITY)  TROPONIN I (HIGH SENSITIVITY)  EKG  EKG Interpretation  Date/Time:  Thursday November 29 2019 02:11:17 EDT Ventricular Rate:  68 PR Interval:    QRS Duration: 74 QT Interval:  392 QTC Calculation: 417 R Axis:   53 Text Interpretation: Sinus rhythm Prolonged PR interval NO STEMI. No old tracing to compare Confirmed by Addison Lank [phone]) on 11/29/2019 2:37:29 AM      Radiology CT HEAD WO CONTRAST  Result Date: 11/28/2019 CLINICAL DATA:  Nontraumatic seizure activity EXAM: CT HEAD WITHOUT CONTRAST TECHNIQUE: Contiguous axial images were obtained from the base of the skull through the vertex without intravenous contrast. COMPARISON:  08/30/2014 FINDINGS: Brain: Mild atrophic changes and chronic white matter ischemic changes are seen commensurate with the patient's given age. No findings to suggest acute hemorrhage, acute infarction or space-occupying mass lesion are noted. Vascular: No hyperdense vessel or unexpected calcification. Skull: Normal. Negative for fracture or focal lesion. Sinuses/Orbits: No acute finding. Other: None. IMPRESSION: Chronic atrophic and ischemic changes similar to that noted on the prior exam. Electronically Signed   By: Inez Catalina M.D.   On: 11/28/2019 18:27    Pertinent labs & imaging results that were available during my care of the patient were reviewed by me and considered in my medical decision making (see chart for details).  Medications Ordered in ED Medications - No data to display                                                                                                                                  Procedures Procedures  (including critical care time)  Medical Decision Making / ED Course I have reviewed the nursing notes for this encounter and  the patient's prior records (if available in EHR or on provided paperwork).   Jodi Summers was evaluated in Emergency Department on 11/29/2019 for the symptoms described in the history of present illness. She was evaluated in the context of the global COVID-19 pandemic, which necessitated consideration that the patient might be at risk for infection with the SARS-CoV-2 virus that causes COVID-19. Institutional protocols and algorithms that pertain to the evaluation of patients at risk for COVID-19 are in a state of rapid change based on information released by regulatory bodies including the CDC and federal and state organizations. These policies and algorithms were followed during the patient's care in the ED.  Patient presents for syncopal episode.  EKG without acute ischemic changes, dysrhythmias, blocks. CT head ordered in triage is negative. Labs here are reassuring without leukocytosis.  Patient does have mild anemia close to prior CBCs.  No significant electrolyte derangements or renal sufficiency.  We will obtain orthostatics and heart enzymes.  Orthostatics were reassuring.  Troponins were negative x2.  UA without evidence of infection.  Patient has been up and ambulatory during her wait time and hearing of being evaluated.  She has been hydrating during this time as well.      Final Clinical  Impression(s) / ED Diagnoses Final diagnoses:  Syncope and collapse    The patient appears reasonably screened and/or stabilized for discharge and I doubt any other medical condition or other [facility] requiring further screening, evaluation, or treatment in the ED at this time prior to discharge. Safe for discharge with strict return precautions.  Disposition: Discharge  Condition: Good  I have discussed the results, Dx and Tx plan with the patient/family who expressed understanding and agree(s) with the plan. Discharge instructions discussed at length. The patient/family was given strict  return precautions who verbalized understanding of the instructions. No further questions at time of discharge.    ED Discharge Orders    None       Follow Up: [address], [place] [id number] [phone]  Schedule an appointment as soon as possible for a visit       This chart was dictated using voice recognition software.  Despite best efforts to proofread,  errors can occur which can change the documentation meaning.   Fatima Blank, MD 11/29/19 [phone]

## 2019-12-06 ENCOUNTER — Ambulatory Visit (INDEPENDENT_AMBULATORY_CARE_PROVIDER_SITE_OTHER): Payer: Medicare Other | Admitting: Physician Assistant

## 2019-12-06 ENCOUNTER — Encounter: Payer: Self-pay | Admitting: Physician Assistant

## 2019-12-06 DIAGNOSIS — M79676 Pain in unspecified toe(s): Secondary | ICD-10-CM | POA: Insufficient documentation

## 2019-12-06 DIAGNOSIS — H01009 Unspecified blepharitis unspecified eye, unspecified eyelid: Secondary | ICD-10-CM

## 2019-12-06 DIAGNOSIS — M25559 Pain in unspecified hip: Secondary | ICD-10-CM | POA: Insufficient documentation

## 2019-12-06 DIAGNOSIS — Z96641 Presence of right artificial hip joint: Secondary | ICD-10-CM

## 2019-12-06 HISTORY — DX: Pain in unspecified toe(s): M79.676

## 2019-12-06 HISTORY — DX: Unspecified blepharitis unspecified eye, unspecified eyelid: H01.009

## 2019-12-06 HISTORY — DX: Pain in unspecified hip: M25.559

## 2019-12-06 NOTE — Progress Notes (Signed)
HPI: Jodi Summers returns now 6 weeks status post right total hip arthroplasty.  She had a syncopal episode with a week had full work-up with negative troponin, negative UA, EKG without ischemic changes or blocks or dysrhythmias and a negative head CT.  Today she states she is doing well.  She has some soreness to the lateral aspect of the right hip.  Otherwise doing well.  She states she does not sleep well.  Review of systems: See HPI otherwise negative  Physical exam: Right hip good range of motion without pain she is able to cross her legs.  She has full dorsiflexion plantarflexion right ankle.  Right calf supple nontender.  Ambulates without an antalgic gait.   Impression: Status post right total hip arthroplasty 11/08/2019  Plan: She will follow-up with [place] at 6 months postop.  We will obtain an AP pelvis and lateral view of the right hip at that time.  Questions were encouraged and answered by the patient and her daughter who is present today.  She will follow-up with [place] sooner if there is any questions or concerns.  Scar tissue mobilization and IT band stretching exercises encouraged.

## 2020-01-24 ENCOUNTER — Other Ambulatory Visit: Payer: Self-pay | Admitting: Family Medicine

## 2020-01-24 DIAGNOSIS — Z1231 Encounter for screening mammogram for malignant neoplasm of breast: Secondary | ICD-10-CM

## 2020-03-06 ENCOUNTER — Ambulatory Visit: Payer: Medicare Other

## 2020-04-17 ENCOUNTER — Ambulatory Visit (INDEPENDENT_AMBULATORY_CARE_PROVIDER_SITE_OTHER): Payer: Medicare Other

## 2020-04-17 ENCOUNTER — Encounter: Payer: Self-pay | Admitting: Physician Assistant

## 2020-04-17 ENCOUNTER — Ambulatory Visit (INDEPENDENT_AMBULATORY_CARE_PROVIDER_SITE_OTHER): Payer: Medicare Other | Admitting: Physician Assistant

## 2020-04-17 DIAGNOSIS — Z96641 Presence of right artificial hip joint: Secondary | ICD-10-CM | POA: Diagnosis not present

## 2020-04-17 NOTE — Progress Notes (Signed)
HPI: Ms. Clausing returns today status post right total hip arthroplasty on 10/26/2019.  She is overall doing great.  She denies any pain in the hip.  She has no complaints.  She is ambulating without any assistive device.  Review of systems: See HPI otherwise negative.  Physical exam: General well-developed well-nourished female no acute distress. Bilateral hips excellent range of motion pain.  Right calf supple nontender.  Dorsiflexion plantarflexion right ankle.  Radiographs: AP pelvis lateral view right hip lateral hips well located.  Right total hip arthroplasty components well-seated.  No bony abnormalities or acute fractures.  Impression: Status post right total hip arthroplasty 10/26/2019  Plan: She will continue to work on range of motion strengthening right hip.  Follow-up with [place] at 1 year postop for an AP pelvis lateral view of the right hip.  She will follow-up with [place] sooner if there is any questions or concerns.  Questions were encouraged and answered at length today.

## 2020-05-13 ENCOUNTER — Other Ambulatory Visit: Payer: Self-pay | Admitting: Family Medicine

## 2020-05-13 DIAGNOSIS — I2510 Atherosclerotic heart disease of native coronary artery without angina pectoris: Secondary | ICD-10-CM

## 2020-06-05 ENCOUNTER — Ambulatory Visit
Admission: RE | Admit: 2020-06-05 | Discharge: 2020-06-05 | Disposition: A | Discharge status: Home / Self Care | Payer: Medicare Other | Source: Ambulatory Visit | Attending: Family Medicine | Admitting: Family Medicine

## 2020-06-05 ENCOUNTER — Other Ambulatory Visit: Payer: Self-pay

## 2020-06-05 DIAGNOSIS — Z1231 Encounter for screening mammogram for malignant neoplasm of breast: Secondary | ICD-10-CM

## 2020-07-11 ENCOUNTER — Other Ambulatory Visit: Payer: Medicare PPO

## 2020-10-29 ENCOUNTER — Other Ambulatory Visit: Payer: Self-pay

## 2020-10-29 ENCOUNTER — Non-Acute Institutional Stay: Payer: Medicare PPO | Admitting: Internal Medicine

## 2020-10-29 ENCOUNTER — Encounter: Payer: Self-pay | Admitting: Internal Medicine

## 2020-10-29 VITALS — BP 126/72 | HR 82 | Temp 96.1°F | Ht 65.5 in | Wt 156.0 lb

## 2020-10-29 DIAGNOSIS — E559 Vitamin D deficiency, unspecified: Secondary | ICD-10-CM

## 2020-10-29 DIAGNOSIS — N760 Acute vaginitis: Secondary | ICD-10-CM | POA: Diagnosis not present

## 2020-10-29 DIAGNOSIS — E782 Mixed hyperlipidemia: Secondary | ICD-10-CM

## 2020-10-29 DIAGNOSIS — E118 Type 2 diabetes mellitus with unspecified complications: Secondary | ICD-10-CM | POA: Diagnosis not present

## 2020-10-29 NOTE — Patient Instructions (Signed)
Miconazole OTC Vaginal Suppository once a day for 7 Days Call us if Symptoms not better Start taking Melatonin. I would suggest 5 mg every night Change time of your Wellbutrin to Mornings.

## 2020-10-30 ENCOUNTER — Ambulatory Visit: Payer: Medicare Other | Admitting: Physician Assistant

## 2020-11-02 NOTE — Progress Notes (Signed)
Location:  [place] of Service:  Clinic (12)  Provider:  Code Status:  Goals of Care:  Advanced Directives 10/18/2019  Does Patient Have a Medical Advance Directive? No  Would patient like information on creating a medical advance directive? Yes (MAU/Ambulatory/Procedural Areas - Information given)     Chief Complaint  Patient presents with   Medical Management of Chronic Issues    Patient here today to establish care.     HPI: Patient is a 80 y.o. female seen today for medical management of chronic diseases.    Came to establish care Moved to [place] recently  Came with there Daughter Was following with her PCP in [place] Her daughter moved her during Covid  She has h/o HLD Did not tolerate Statin and was put on repatha for sometime but then off everything now H/o Breast Cancer s/p Right Mastectomy S/P Right hip Arthroplasty  in 9/21 H/o Intraventricular Hemorrhage in 10/16 after a fall Diabetes Type 2  Depression Insomnia  Recent Cognitive impairment as noticed by Daughter Had Neuropsychology Eval  Noticeable since she has moved to [name] Has very Strong History of Alzheimer. Her Sister and Mother Her MMSE in their office was 11/30 Word Recall was 2/3 Indepdent in her ADLS Daughter helping with IADLS Not driving Walks with no assist  Also Noticed Vaginal Itching No Dysuria. NO discharge No Bleeding   Past Medical History:  Diagnosis Date   Anemia    Anxiety    Arthritis    Breast cancer ([name]) 1970s   right breast   Depression    Personal history of chemotherapy    Personal history of radiation therapy     Past Surgical History:  Procedure Laterality Date   ABDOMINAL HYSTERECTOMY     BREAST LUMPECTOMY Right 1976   CHOLECYSTECTOMY     JOINT REPLACEMENT     L knee replacement   TONSILLECTOMY     TOTAL HIP ARTHROPLASTY Right 10/26/2019   Procedure: RIGHT TOTAL HIP ARTHROPLASTY ANTERIOR APPROACH;  Surgeon:  Mcarthur Rossetti, MD;  Location: [facility];  Service: Orthopedics;  Laterality: Right;   WISDOM TOOTH EXTRACTION      No Known Allergies  Outpatient Encounter Medications as of 10/29/2020  Medication Sig   buPROPion (ZYBAN) 150 MG 12 hr tablet Take 150 mg by mouth daily.   clobetasol ointment (TEMOVATE) 0.05 % clobetasol 0.05 % topical ointment   cycloSPORINE (RESTASIS) 0.05 % ophthalmic emulsion Place 1 drop into both eyes 2 (two) times daily.   dexamethasone (DECADRON) 0.1 % ophthalmic suspension Place into both eyes 2 (two) times daily.   magnesium 30 MG tablet Take 30 mg by mouth daily.   metFORMIN (GLUCOPHAGE) 500 MG tablet Take by mouth 2 (two) times daily with a meal.   Multiple Vitamin tablet Take 1 tablet by mouth daily.   Omega 3-6-9 Fatty Acids (OMEGA 3-6-9 COMPLEX) CAPS Take by mouth daily.   VITAMIN A PO Take by mouth daily.   [DISCONTINUED] Melatonin 10 MG TABS Take 10 mg by mouth daily.   [DISCONTINUED] Omega-3 1000 MG CAPS Take 1,000 mg by mouth daily. Biomega   No facility-administered encounter medications on file as of 10/29/2020.    Review of Systems:  Review of Systems  Constitutional: Negative.   HENT: Negative.    Respiratory: Negative.    Cardiovascular: Negative.   Gastrointestinal: Negative.   Genitourinary:  Negative for dysuria.       Vaginal Itching  Musculoskeletal: Negative.  Skin: Negative.   Neurological: Negative.   Psychiatric/Behavioral:  Positive for confusion, dysphoric mood and sleep disturbance.    Health Maintenance  Topic Date Due   URINE MICROALBUMIN  Never done   Hepatitis C Screening  Never done   TETANUS/TDAP  Never done   Zoster Vaccines- Shingrix (1 of 2) Never done   DEXA SCAN  Never done   COVID-19 Vaccine (4 - Booster for Moderna series) 04/30/2020   INFLUENZA VACCINE  Never done   HPV VACCINES  Aged Out    Physical Exam: Vitals:   10/29/20 1032  BP: 126/72  Pulse: 82  Temp: (!) 96.1 F (35.6 C)  SpO2: 97%   Weight: 156 lb (70.8 kg)  Height: 5' 5.5" (1.664 m)   Body mass index is 25.56 kg/m. Physical Exam Constitutional: Oriented to person, place, and time. Well-developed and well-nourished.  HENT:  Head: Normocephalic.  Mouth/Throat: Oropharynx is clear and moist.  Eyes: Pupils are equal, round, and reactive to light.  Neck: Neck supple.  Cardiovascular: Normal rate and normal heart sounds.  No murmur heard. Pulmonary/Chest: Effort normal and breath sounds normal. No respiratory distress. No wheezes. She has no rales.  Abdominal: Soft. Bowel sounds are normal. No distension. There is no tenderness. There is no rebound.  Musculoskeletal: No edema.  Lymphadenopathy: none Neurological: Alert and oriented to person, place, and time.  Skin: Skin is warm and dry.  Psychiatric: Normal mood and affect. Behavior is normal. Thought content normal.   Labs reviewed: Basic Metabolic Panel: Recent Labs    11/28/19 1541  NA 138  K 3.5  CL 102  CO2 26  GLUCOSE 99  BUN 10  CREATININE 0.94  CALCIUM 9.3   Liver Function Tests: No results for input(s): AST, ALT, ALKPHOS, BILITOT, PROT, ALBUMIN in the last 8760 hours. No results for input(s): LIPASE, AMYLASE in the last 8760 hours. No results for input(s): AMMONIA in the last 8760 hours. CBC: Recent Labs    11/28/19 1541  WBC 7.6  HGB 11.7*  HCT 36.8  MCV 92.0  PLT 241   Lipid Panel: No results for input(s): CHOL, HDL, LDLCALC, TRIG, CHOLHDL, LDLDIRECT in the last 8760 hours. No results found for: HGBA1C  Procedures since last visit: No results found.  Assessment/Plan 1. Mixed hyperlipidemia Was on Repatha Before In tolerable to statins Will Repeat Lipid Panel  2. Vitamin D deficiency On Supplement  3. Controlled type 2 diabetes mellitus with complication, without long-term current use of insulin (HCC) Metformin BID Repeat A1C Follows with Opthalmologist  4. Acute vaginitis Miconazole 5 Cognitive impairment CT scan  in 10/21 showed Chronic Ischamic changes Will do MMSE next visit again Can consider treatment 6 Depression with Insomnia Change the time of Wellbutrin in the morning ot see if it helps her Insomnia Also Try Melatonin at night    Labs/tests ordered:  CBC,CMP,TSH,Lipid , B 12 Vit D level Next appt:  02/25/2021

## 2020-11-04 LAB — BASIC METABOLIC PANEL
BUN: 11 (ref 4–21)
CO2: 26 — AB (ref 13–22)
Chloride: 104 (ref 99–108)
Creatinine: 0.7 (ref 0.5–1.1)
Glucose: 140
Potassium: 4.2 (ref 3.4–5.3)
Sodium: 142 (ref 137–147)

## 2020-11-04 LAB — COMPREHENSIVE METABOLIC PANEL
Albumin: 4.4 (ref 3.5–5.0)
Calcium: 9.4 (ref 8.7–10.7)
Globulin: 2.6

## 2020-11-04 LAB — CBC AND DIFFERENTIAL
HCT: 39 (ref 36–46)
Hemoglobin: 13.3 (ref 12.0–16.0)
Platelets: 124 — AB (ref 150–399)
WBC: 8.1

## 2020-11-04 LAB — HEPATIC FUNCTION PANEL
ALT: 36 — AB (ref 7–35)
AST: 28 (ref 13–35)
Alkaline Phosphatase: 74 (ref 25–125)
Bilirubin, Total: 0.2

## 2020-11-04 LAB — VITAMIN D 25 HYDROXY (VIT D DEFICIENCY, FRACTURES): Vit D, 25-Hydroxy: 54.7

## 2020-11-04 LAB — VITAMIN B12: Vitamin B-12: 540

## 2020-11-04 LAB — LIPID PANEL
Cholesterol: 232 — AB (ref 0–200)
HDL: 62 (ref 35–70)
LDL Cholesterol: 141
Triglycerides: 149 (ref 40–160)

## 2020-11-04 LAB — CBC: RBC: 4.32 (ref 3.87–5.11)

## 2020-11-04 LAB — TSH: TSH: 2.18 (ref 0.41–5.90)

## 2021-01-02 DIAGNOSIS — R4189 Other symptoms and signs involving cognitive functions and awareness: Secondary | ICD-10-CM | POA: Diagnosis not present

## 2021-01-02 DIAGNOSIS — R41841 Cognitive communication deficit: Secondary | ICD-10-CM | POA: Diagnosis not present

## 2021-02-25 ENCOUNTER — Other Ambulatory Visit: Payer: Self-pay

## 2021-02-25 ENCOUNTER — Encounter: Payer: Self-pay | Admitting: Internal Medicine

## 2021-02-25 ENCOUNTER — Non-Acute Institutional Stay: Payer: Medicare PPO | Admitting: Internal Medicine

## 2021-02-25 VITALS — BP 128/64 | HR 74 | Temp 97.3°F | Ht 65.5 in | Wt 159.0 lb

## 2021-02-25 DIAGNOSIS — F32A Depression, unspecified: Secondary | ICD-10-CM

## 2021-02-25 DIAGNOSIS — F03A0 Unspecified dementia, mild, without behavioral disturbance, psychotic disturbance, mood disturbance, and anxiety: Secondary | ICD-10-CM | POA: Diagnosis not present

## 2021-02-25 DIAGNOSIS — E118 Type 2 diabetes mellitus with unspecified complications: Secondary | ICD-10-CM | POA: Diagnosis not present

## 2021-02-25 DIAGNOSIS — E782 Mixed hyperlipidemia: Secondary | ICD-10-CM

## 2021-02-25 NOTE — Patient Instructions (Signed)
Get her Pneumonia shot information and Tetanus shot Get Flu shot

## 2021-02-26 NOTE — Progress Notes (Signed)
Location:  [place] of Service:  Clinic (12)  Provider:   Code Status:  Goals of Care:  Advanced Directives 02/25/2021  Does Patient Have a Medical Advance Directive? No  Would patient like information on creating a medical advance directive? -     Chief Complaint  Patient presents with   Medical Management of Chronic Issues    Patient returns to the clinic for 4 month follow up. Patient has no concerns, she isn't taking anything other than eye drops.   Quality Metric Gaps    Pneumonia Vaccine 27+ Years old (1 - PCV)  URINE MICROALBUMIN (Yearly) Hepatitis C Screening (Once)  TETANUS/TDAP (Every 10 Years) Zoster Vaccines- Shingrix (1 of 2) DEXA SCAN (Once) COVID-19 Vaccine (3 - Booster for Moderna series) INFLUENZA VACCINE      HPI: Patient is a 81 y.o. female seen today for medical management of chronic diseases.    Moved to [place] recently  Came with there Daughter Was following with her PCP in [place] Her daughter moved her during Covid   She has h/o HLD Did not tolerate Statin and was put on repatha for sometime but then off everything now H/o Breast Cancer s/p Right Mastectomy S/P Right hip Arthroplasty  in 9/21 H/o Intraventricular Hemorrhage in 10/16 after a fall Diabetes Type 2  Depression Insomnia  Dementia Neuropsychology Eval 04/22 Noticeable since she has moved to [place] Has very Strong family History of Alzheimer. Her Sister and Mother Her MMSE in their office was 29/30 Word Recall was 2/3   Since her last visit she has been off all her meds except Systane OTC drops Per daughter she will forget to take her meds and in the end started telling them that she does not want to take it.So they stopped it She continue sot do well with her ADLS. Family taking care of her Finances No Falls. Walks with cane for safety Per daughter keeping her Apartment clean. Only keeps Chair on the door at night as she is worried  about crime. No Other behaviors issues   Past Medical History:  Diagnosis Date   Anemia    Anxiety    Arthritis    Breast cancer ([facility]) 1970s   right breast   Depression    Personal history of chemotherapy    Personal history of radiation therapy     Past Surgical History:  Procedure Laterality Date   ABDOMINAL HYSTERECTOMY     BREAST LUMPECTOMY Right 1976   CHOLECYSTECTOMY     JOINT REPLACEMENT     L knee replacement   TONSILLECTOMY     TOTAL HIP ARTHROPLASTY Right 10/26/2019   Procedure: RIGHT TOTAL HIP ARTHROPLASTY ANTERIOR APPROACH;  Surgeon: Mcarthur Rossetti, MD;  Location: [facility];  Service: Orthopedics;  Laterality: Right;   WISDOM TOOTH EXTRACTION      No Known Allergies  Outpatient Encounter Medications as of 02/25/2021  Medication Sig   Polyethyl Glycol-Propyl Glycol (SYSTANE) 0.4-0.3 % GEL ophthalmic gel Place 1 application into both eyes daily as needed.   buPROPion (ZYBAN) 150 MG 12 hr tablet Take 150 mg by mouth daily. (Patient not taking: Reported on 02/25/2021)   clobetasol ointment (TEMOVATE) 0.05 % clobetasol 0.05 % topical ointment (Patient not taking: Reported on 02/25/2021)   cycloSPORINE (RESTASIS) 0.05 % ophthalmic emulsion Place 1 drop into both eyes 2 (two) times daily. (Patient not taking: Reported on 02/25/2021)   dexamethasone (DECADRON) 0.1 % ophthalmic suspension Place into both eyes  2 (two) times daily.   magnesium 30 MG tablet Take 30 mg by mouth daily. (Patient not taking: Reported on 02/25/2021)   metFORMIN (GLUCOPHAGE) 500 MG tablet Take by mouth 2 (two) times daily with a meal. (Patient not taking: Reported on 02/25/2021)   Multiple Vitamin tablet Take 1 tablet by mouth daily. (Patient not taking: Reported on 02/25/2021)   Omega 3-6-9 Fatty Acids (OMEGA 3-6-9 COMPLEX) CAPS Take by mouth daily. (Patient not taking: Reported on 02/25/2021)   VITAMIN A PO Take by mouth daily. (Patient not taking: Reported on 02/25/2021)   No facility-administered  encounter medications on file as of 02/25/2021.    Review of Systems:  Review of Systems  Constitutional:  Negative for activity change and appetite change.  HENT: Negative.    Respiratory:  Negative for cough and shortness of breath.   Cardiovascular:  Negative for leg swelling.  Gastrointestinal:  Negative for constipation.  Genitourinary: Negative.   Musculoskeletal:  Negative for arthralgias, gait problem and myalgias.  Skin: Negative.   Neurological:  Negative for dizziness and weakness.  Psychiatric/Behavioral:  Positive for confusion and dysphoric mood. Negative for sleep disturbance.    Health Maintenance  Topic Date Due   Pneumonia Vaccine 36+ Years old (1 - PCV) Never done   URINE MICROALBUMIN  Never done   Hepatitis C Screening  Never done   TETANUS/TDAP  Never done   Zoster Vaccines- Shingrix (1 of 2) Never done   DEXA SCAN  Never done   COVID-19 Vaccine (3 - Booster for Moderna series) 02/26/2020   INFLUENZA VACCINE  Never done   HPV VACCINES  Aged Out    Physical Exam: Vitals:   02/25/21 1014  BP: 128/64  Pulse: 74  Temp: (!) 97.3 F (36.3 C)  SpO2: 96%  Weight: 159 lb (72.1 kg)  Height: 5' 5.5" (1.664 m)   Body mass index is 26.06 kg/m. Physical Exam Vitals reviewed.  Constitutional:      Appearance: Normal appearance.  HENT:     Head: Normocephalic.     Nose: Nose normal.     Mouth/Throat:     Mouth: Mucous membranes are moist.     Pharynx: Oropharynx is clear.  Eyes:     Pupils: Pupils are equal, round, and reactive to light.  Cardiovascular:     Rate and Rhythm: Normal rate and regular rhythm.     Pulses: Normal pulses.     Heart sounds: Normal heart sounds. No murmur heard. Pulmonary:     Effort: Pulmonary effort is normal.     Breath sounds: Normal breath sounds.  Abdominal:     General: Abdomen is flat. Bowel sounds are normal.     Palpations: Abdomen is soft.  Musculoskeletal:        General: No swelling.     Cervical back: Neck  supple.  Skin:    General: Skin is warm.  Neurological:     General: No focal deficit present.     Mental Status: She is alert.     Comments: Looks at her daughter for answers.  Gait was stable  Psychiatric:        Mood and Affect: Mood normal.        Thought Content: Thought content normal.  .mm Labs reviewed: Basic Metabolic Panel: Recent Labs    11/04/20 0000  NA 142  K 4.2  CL 104  CO2 26*  BUN 11  CREATININE 0.7  CALCIUM 9.4  TSH 2.18   Liver Function  Tests: Recent Labs    11/04/20 0000  AST 28  ALT 36*  ALKPHOS 74  ALBUMIN 4.4   No results for input(s): LIPASE, AMYLASE in the last 8760 hours. No results for input(s): AMMONIA in the last 8760 hours. CBC: Recent Labs    11/04/20 0000  WBC 8.1  HGB 13.3  HCT 39  PLT 124*   Lipid Panel: Recent Labs    11/04/20 0000  CHOL 232*  HDL 62  LDLCALC 141  TRIG 149   No results found for: HGBA1C  Procedures since last visit: No results found.  Assessment/Plan 1. Controlled type 2 diabetes mellitus with complication, without long-term current use of insulin (HCC) Off Metformin by her choice Repeat A1C in 3 months Patient says she does not want any meds  2. Mixed hyperlipidemia Off Repatha and statins Continue to monitor  3. Mild dementia without behavioral disturbance,   I think patient has progressive Dementia CT scan in 10/21 showed Chronic Ischamic changes Patient refusing to consider any Meds Worked with Speech therapy already   - Ambulatory referral to Neurology 4 Depression She says she has some sadness But Sleeping well And weight is good Refusing meds right now Stopped her Wellbutrin as will forget to take it   Labs/tests ordered:  CBC,CMP,Lipid A1C Next appt:  05/27/2021

## 2021-03-04 ENCOUNTER — Encounter: Payer: Self-pay | Admitting: Physician Assistant

## 2021-03-04 ENCOUNTER — Other Ambulatory Visit: Payer: Self-pay

## 2021-03-04 ENCOUNTER — Ambulatory Visit: Payer: Medicare PPO | Admitting: Physician Assistant

## 2021-03-04 VITALS — BP 146/65 | HR 58 | Resp 18 | Ht 66.0 in | Wt 161.0 lb

## 2021-03-04 DIAGNOSIS — R413 Other amnesia: Secondary | ICD-10-CM

## 2021-03-04 DIAGNOSIS — G3184 Mild cognitive impairment, so stated: Secondary | ICD-10-CM

## 2021-03-04 NOTE — Progress Notes (Signed)
Assessment/Plan:   Jodi Summers is a very pleasant 81 y.o. year old RH female with risk factors including  age,history of breast cancer status post right mastectomy chemo/XRT, arthritis, history of intraventricular hemorrhage in October 2016 after a fall,  DM2, depression, insomnia, hypertension, hyperlipidemia, and situational depression seen today for evaluation of memory loss. MoCA today is 25/30.  With some deficiencies in visual-spatial executive language and delayed recall 3/5. the patient had a prior neurocognitive testing on 05/27/2020 at [facility], yielding a normal result.  She reports difficulties with her memory since that time, and during the exam, although many of the responses were correct, she showed this difficulty, needing more time to obtain the correct words and answers. Strong family history of Alzheimer's disease.     Recommendations:   Mild Cognitive Impairment  MRI brain with/without contrast to assess for underlying structural abnormality and assess vascular load  Repeat Neurocognitive testing to further evaluate cognitive concerns and determine any progression of underlying cause of memory changes, including potential contribution from sleep, anxiety, attention  or depression  Start donepezil 5 mg, take 2.5 mg tablet daily for 2 weeks, then increase to 5 mg daily if tolerated.  Side effects discussed. Discussed safety both in and out of the home.  Discussed the importance of regular daily schedule with inclusion of crossword puzzles to maintain brain function and activities offered at [employer]  Continue to monitor mood with PCP.  Stay active at least 30 minutes at least 3 times a week.  Naps should be scheduled and should be no longer than 60 minutes and should not occur after 2 PM.  Control cardiovascular risk factors  Mediterranean diet is recommended  Folllow up once results above are available   Subjective:    The patient is seen in neurologic  consultation at the request of Jodi Dad, MD for the evaluation of memory. The patient is accompanied by [name] who supplements the history. This is a 81 y.o. year old RH  female who has had memory issues for about 3 years.  The patient has moved from [place] during the pandemic to be near her daughter, and moved to [facility] independent living, which is reported to be an "exhausting experience, as she was presented with so many things to do and to many people introducing themselves, which appeared to be overwhelming to her ".  Her daughter reports that this took a toll on her emotional and mental readiness.  Due to a noticeable cognitive decline, her daughter arranged Neuropsych evaluation by Jodi Nakai, PhD from neuropsychology at [facility]) in 05/27/2020 yielding an normal examination.  Over the last 6 to 8 months, her daughter states that short-term memory is worse, sometimes not remembering that she asked the same question, or told the same story.  She has not been sleeping well either, tried low dose of melatonin without significant improvement.  Denies any vivid dreams, REM behavior, or sleepwalking.  She denies any hallucinations, but she uses a cane "just in case I need to fight somebody that tries to get in the apartment ".  She denies any confusion when coming to her room, or leaving objects in unusual places.  There is some degree of depression due to all this changes.  She enjoys doing crossword puzzles and word finding.  She still trying to adjust to independent living facility.  In October 2022 she had COVID, and was in isolation for about 10 days, which in  turn appeared to be therapeutic, as she did not need to interact with other residents.  There are no hygiene concerns, she is independent of bathing and dressing.  Her medications are in a pillbox, occasionally she may forget the dose.  Daughter is in charge of the finances.  Appetite is fairly good,  denies trouble swallowing.  She denies any recent falls.  She sustained a head injury in October 2016 after falling in the parking lot of her [employer], with loss of consciousness but with no reported retroactive amnesia, with intraventricular hemorrhage needing hospitalization.  She had posttraumatic amnestic period of less than 4 hours.  Denies headaches, double vision, dizziness, focal numbness or tingling, unilateral weakness or tremors or anosmia. No history of seizures. Denies urine incontinence, retention, constipation or diarrhea.  Denies OSA, ETOH or Tobacco. Family History strong for Alzheimer's disease in sister and mother.  She is a retired Pharmacist, hospital     CT scan performed in July 2016 reportedly revealed a linear hyperdensity within the posterior aspect of the lateral ventricles consistent with a small amount of intraventricular hemorrhage. There was also minimal blood products layering in the posterior horns of the lateral ventricles. Moreover, there was a 2 mm subdural hematoma lateral to the right temporal lobe. It was purportedly not hyperdense and therefore could be subacute. Focal hyperdensity in the right sylvian fissure  which possibly represent a minimal amount of traumatic subarachnoid hemorrhage.  A brain MRI scan performed in July 2016 reportedly revealed global parenchymal brain volume loss mildly advanced for age and mild white matter changes compatible with chronic small vessel ischemic disease.    Neuropsychological test results May 27, 2020: Mini-Mental State Exam-2 (Red) Raw Score Total 28/30 . Word recall 2/3   Jodi Summers is a 81 year old, right-handed, Caucasian female, who reported experiencing worsening memory difficulties over the past 1 to 2 years; her daughter agreed. Onset was insidious with no precipitating factors endorsed. Test results revealed average (if not above average to superior) range functioning across most measures of learning and memory, language,  attention and processing speed, visual-spatial and constructional abilities, and executive functions. Low scores of unclear significance were noted on only two tasks (i.e., object naming and visual free recall) and these very well may be incidental and are at this point thought to be subclinical. From an emotional standpoint, there are no clear indications of significant clinical psychopathology.  Jodi Summers performance does not evidence any major neurocognitive challenges and her performance does not warrant a cognitive diagnosis at this time, particularly given her intact functional status. At this point, I am also not concerned for a prodromal neurodegenerative process but we are happy to see her again in the future should they remain concerned for a worsening problem. In the meantime, I have recommended that her primary care provider consider ordering certain lab work to assess for any other possible etiologies of chronic and/or transient cognitive issues.   No Known Allergies  Current Outpatient Medications  Medication Instructions   cycloSPORINE (RESTASIS) 0.05 % ophthalmic emulsion 1 drop, 2 times daily   Polyethyl Glycol-Propyl Glycol (SYSTANE) 0.4-0.3 % GEL ophthalmic gel 1 application, Daily PRN     VITALS:   Vitals:   03/04/21 1339  BP: (!) 146/65  Pulse: (!) 58  Resp: 18  SpO2: 98%  Weight: 161 lb (73 kg)  Height: 5\' 6"  (1.676 m)   No flowsheet data found.  PHYSICAL EXAM   HEENT:  Normocephalic, atraumatic. The mucous membranes are moist.  The superficial temporal arteries are without ropiness or tenderness. Cardiovascular: Regular rate and rhythm. Lungs: Clear to auscultation bilaterally. Neck: There are no carotid bruits noted bilaterally.  NEUROLOGICAL: Montreal Cognitive Assessment  03/05/2021  Visuospatial/ Executive (0/5) 5  Naming (0/3) 2  Attention: Read list of digits (0/2) 2  Attention: Read list of letters (0/1) 1  Attention: Serial 7 subtraction starting  at 100 (0/3) 3  Language: Repeat phrase (0/2) 1  Language : Fluency (0/1) 1  Abstraction (0/2) 1  Delayed Recall (0/5) 3  Orientation (0/6) 6  Total 25  Adjusted Score (based on education) 25   No flowsheet data found.  No flowsheet data found.   Orientation:  Alert and oriented to person, place and time. No aphasia or dysarthria. Fund of knowledge is appropriate. Recent memory impaired and remote memory intact.  Attention and concentration are normal.  Able to name objects and repeat phrases. Delayed recall 3 /5 Cranial nerves: There is good facial symmetry. Extraocular muscles are intact and visual fields are full to confrontational testing. Speech is fluent and clear. Soft palate rises symmetrically and there is no tongue deviation. Hearing is intact to conversational tone. Tone: Tone is good throughout. Sensation: Sensation is intact to light touch and pinprick throughout. Vibration is intact at the bilateral big toe.There is no extinction with double simultaneous stimulation. There is no sensory dermatomal level identified. Coordination: The patient has no difficulty with RAM's or FNF bilaterally. Normal finger to nose  Motor: Strength is 5/5 in the bilateral upper and lower extremities. There is no pronator drift. There are no fasciculations noted. DTR's: Deep tendon reflexes are 2/4 at the bilateral biceps, triceps, brachioradialis, patella and achilles.  Plantar responses are downgoing bilaterally. Gait and Station: The patient is able to ambulate without difficulty.The patient is able to heel toe walk without any difficulty.The patient is able to ambulate in a tandem fashion. The patient is able to stand in the Romberg position.     Thank you for allowing [place] the opportunity to participate in the care of this nice patient. Please do not hesitate to contact us for any questions or concerns.   Total time spent on today's visit was 60 minutes, including both face-to-face time and  nonface-to-face time.  Time included that spent on review of records (prior notes available to me/labs/imaging if pertinent), discussing treatment and goals, answering patient's questions and coordinating care.  Cc:  Jodi Dad, MD  Sharene Butters 03/05/2021 7:15 AM

## 2021-03-04 NOTE — Patient Instructions (Addendum)
It was a pleasure to see you today at our office.   Recommendations:  Neurocognitive evaluation at our office MRI of the brain, the radiology office will call you to arrange you appointment Follow up after neurocognitive testing  RECOMMENDATIONS FOR ALL PATIENTS WITH MEMORY PROBLEMS: 1. Continue to exercise (Recommend 30 minutes of walking everyday, or 3 hours every week) 2. Increase social interactions - continue going to [place] and enjoy social gatherings with friends and family 3. Eat healthy, avoid fried foods and eat more fruits and vegetables 4. Maintain adequate blood pressure, blood sugar, and blood cholesterol level. Reducing the risk of stroke and cardiovascular disease also helps promoting better memory. 5. Avoid stressful situations. Live a simple life and avoid aggravations. Organize your time and prepare for the next day in anticipation. 6. Sleep well, avoid any interruptions of sleep and avoid any distractions in the bedroom that may interfere with adequate sleep quality 7. Avoid sugar, avoid sweets as there is a strong link between excessive sugar intake, diabetes, and cognitive impairment We discussed the Mediterranean diet, which has been shown to help patients reduce the risk of progressive memory disorders and reduces cardiovascular risk. This includes eating fish, eat fruits and green leafy vegetables, nuts like almonds and hazelnuts, walnuts, and also use olive oil. Avoid fast foods and fried foods as much as possible. Avoid sweets and sugar as sugar use has been linked to worsening of memory function.  There is always a concern of gradual progression of memory problems. If this is the case, then we may need to adjust level of care according to patient needs. Support, both to the patient and caregiver, should then be put into place.      You have been referred for a neuropsychological evaluation (i.e., evaluation of memory and thinking abilities). Please bring someone  with you to this appointment if possible, as it is helpful for the doctor to hear from both you and another adult who knows you well. Please bring eyeglasses and hearing aids if you wear them.    The evaluation will take approximately 3 hours and has two parts:   The first part is a clinical interview with the neuropsychologist (Dr. Melvyn Novas or Dr. Nicole Kindred). During the interview, the neuropsychologist will speak with you and the individual you brought to the appointment.    The second part of the evaluation is testing with the doctor's technician Hinton Dyer or Maudie Mercury). During the testing, the technician will ask you to remember different types of material, solve problems, and answer some questionnaires. Your family member will not be present for this portion of the evaluation.   Please note: We must reserve several hours of the neuropsychologist's time and the psychometrician's time for your evaluation appointment. As such, there is a No-Show fee of $100. If you are unable to attend any of your appointments, please contact our office as soon as possible to reschedule.    FALL PRECAUTIONS: Be cautious when walking. Scan the area for obstacles that may increase the risk of trips and falls. When getting up in the mornings, sit up at the edge of the bed for a few minutes before getting out of bed. Consider elevating the bed at the head end to avoid drop of blood pressure when getting up. Walk always in a well-lit room (use night lights in the walls). Avoid area rugs or power cords from appliances in the middle of the walkways. Use a walker or a cane if necessary and consider physical therapy  for balance exercise. Get your eyesight checked regularly.  FINANCIAL OVERSIGHT: Supervision, especially oversight when making financial decisions or transactions is also recommended.  HOME SAFETY: Consider the safety of the kitchen when operating appliances like stoves, microwave oven, and blender. Consider having supervision  and share cooking responsibilities until no longer able to participate in those. Accidents with firearms and other hazards in the house should be identified and addressed as well.   ABILITY TO BE LEFT ALONE: If patient is unable to contact 911 operator, consider using LifeLine, or when the need is there, arrange for someone to stay with patients. Smoking is a fire hazard, consider supervision or cessation. Risk of wandering should be assessed by caregiver and if detected at any point, supervision and safe proof recommendations should be instituted.  MEDICATION SUPERVISION: Inability to self-administer medication needs to be constantly addressed. Implement a mechanism to ensure safe administration of the medications.   DRIVING: Regarding driving, in patients with progressive memory problems, driving will be impaired. We advise to have someone else do the driving if trouble finding directions or if minor accidents are reported. Independent driving assessment is available to determine safety of driving.   If you are interested in the driving assessment, you can contact the following:  [employer] in [place]  [place] [phone] or [phone]    [place] refers to food and lifestyle choices that are based on the traditions of countries located on the [employer]. This way of eating has been shown to help prevent certain conditions and improve outcomes for people who have chronic diseases, like kidney disease and heart disease. What are tips for following this plan? Lifestyle  Cook and eat meals together with your family, when possible. Drink enough fluid to keep your urine clear or pale yellow. Be physically active every day. This includes: Aerobic exercise like running or swimming. Leisure activities like gardening, walking, or  housework. Get 7-8 hours of sleep each night. If recommended by your health care provider, drink red wine in moderation. This means 1 glass a day for nonpregnant women and 2 glasses a day for men. A glass of wine equals 5 oz (150 mL). Reading food labels  Check the serving size of packaged foods. For foods such as rice and pasta, the serving size refers to the amount of cooked product, not dry. Check the total fat in packaged foods. Avoid foods that have saturated fat or trans fats. Check the ingredients list for added sugars, such as corn syrup. Shopping  At the grocery store, buy most of your food from the areas near the walls of the store. This includes: Fresh fruits and vegetables (produce). Grains, beans, nuts, and seeds. Some of these may be available in unpackaged forms or large amounts (in bulk). Fresh seafood. Poultry and eggs. Low-fat dairy products. Buy whole ingredients instead of prepackaged foods. Buy fresh fruits and vegetables in-season from local farmers markets. Buy frozen fruits and vegetables in resealable bags. If you do not have access to quality fresh seafood, buy precooked frozen shrimp or canned fish, such as tuna, salmon, or sardines. Buy small amounts of raw or cooked vegetables, salads, or olives from the deli or salad bar at your store. Stock your pantry so you always have certain foods on hand, such as olive oil, canned tuna, canned tomatoes, rice, pasta, and beans. Cooking  Cook foods with extra-virgin olive oil instead  of using butter or other vegetable oils. Have meat as a side dish, and have vegetables or grains as your main dish. This means having meat in small portions or adding small amounts of meat to foods like pasta or stew. Use beans or vegetables instead of meat in common dishes like chili or lasagna. Experiment with different cooking methods. Try roasting or broiling vegetables instead of steaming or sauteing them. Add frozen vegetables to soups,  stews, pasta, or rice. Add nuts or seeds for added healthy fat at each meal. You can add these to yogurt, salads, or vegetable dishes. Marinate fish or vegetables using olive oil, lemon juice, garlic, and fresh herbs. Meal planning  Plan to eat 1 vegetarian meal one day each week. Try to work up to 2 vegetarian meals, if possible. Eat seafood 2 or more times a week. Have healthy snacks readily available, such as: Vegetable sticks with hummus. Greek yogurt. Fruit and nut trail mix. Eat balanced meals throughout the week. This includes: Fruit: 2-3 servings a day Vegetables: 4-5 servings a day Low-fat dairy: 2 servings a day Fish, poultry, or lean meat: 1 serving a day Beans and legumes: 2 or more servings a week Nuts and seeds: 1-2 servings a day Whole grains: 6-8 servings a day Extra-virgin olive oil: 3-4 servings a day Limit red meat and sweets to only a few servings a month What are my food choices? Mediterranean diet Recommended Grains: Whole-grain pasta. Brown rice. Bulgar wheat. Polenta. Couscous. Whole-wheat bread. [place]. Vegetables: Artichokes. Beets. Broccoli. Cabbage. Carrots. Eggplant. Green beans. Chard. Kale. Spinach. Onions. Leeks. Peas. Squash. Tomatoes. Peppers. Radishes. Fruits: Apples. Apricots. Avocado. Berries. Bananas. Cherries. Dates. Figs. Grapes. Lemons. Melon. Oranges. Peaches. Plums. Pomegranate. Meats and other protein foods: Beans. Almonds. Sunflower seeds. Pine nuts. Peanuts. [place]. Salmon. Scallops. Shrimp. [place]. Tilapia. Clams. Oysters. Eggs. Dairy: Low-fat milk. Cheese. Greek yogurt. Beverages: Water. Red wine. Herbal tea. Fats and oils: Extra virgin olive oil. Avocado oil. Grape seed oil. Sweets and desserts: [place] yogurt with honey. Baked apples. Poached pears. Trail mix. Seasoning and other foods: Basil. Cilantro. Coriander. Cumin. Mint. Parsley. Sage. Rosemary. Tarragon. Garlic. Oregano. Thyme. Pepper. Balsalmic vinegar. Tahini. Hummus. Tomato  sauce. Olives. Mushrooms. Limit these Grains: Prepackaged pasta or rice dishes. Prepackaged cereal with added sugar. Vegetables: Deep fried potatoes (french fries). Fruits: Fruit canned in syrup. Meats and other protein foods: Beef. Pork. Lamb. Poultry with skin. Hot dogs. [place] Salines. Dairy: Ice cream. Sour cream. Whole milk. Beverages: Juice. Sugar-sweetened soft drinks. Beer. Liquor and spirits. Fats and oils: Butter. Canola oil. Vegetable oil. Beef fat (tallow). Lard. Sweets and desserts: Cookies. Cakes. Pies. Candy. Seasoning and other foods: Mayonnaise. Premade sauces and marinades. The items listed may not be a complete list. Talk with your dietitian about what dietary choices are right for you. Summary The Mediterranean diet includes both food and lifestyle choices. Eat a variety of fresh fruits and vegetables, beans, nuts, seeds, and whole grains. Limit the amount of red meat and sweets that you eat. Talk with your health care provider about whether it is safe for you to drink red wine in moderation. This means 1 glass a day for nonpregnant women and 2 glasses a day for men. A glass of wine equals 5 oz (150 mL). This information is not intended to replace advice given to you by your health care provider. Make sure you discuss any questions you have with your health care provider. Document Released: 09/25/2015 Document Revised: 10/28/2015 Document Reviewed: 09/25/2015 Elsevier Interactive  Patient Education  2017 [employer].   We have sent a referral to [facility] for your MRI and they will call you directly to schedule your appointment. They are located at [place]. If you need to contact them directly please call [phone].

## 2021-03-05 DIAGNOSIS — G3184 Mild cognitive impairment, so stated: Secondary | ICD-10-CM | POA: Insufficient documentation

## 2021-03-17 ENCOUNTER — Other Ambulatory Visit: Payer: Self-pay

## 2021-03-17 ENCOUNTER — Ambulatory Visit (HOSPITAL_BASED_OUTPATIENT_CLINIC_OR_DEPARTMENT_OTHER): Payer: Medicare PPO | Admitting: Obstetrics & Gynecology

## 2021-03-17 ENCOUNTER — Encounter (HOSPITAL_BASED_OUTPATIENT_CLINIC_OR_DEPARTMENT_OTHER): Payer: Self-pay | Admitting: Obstetrics & Gynecology

## 2021-03-17 ENCOUNTER — Other Ambulatory Visit (HOSPITAL_BASED_OUTPATIENT_CLINIC_OR_DEPARTMENT_OTHER): Payer: Self-pay

## 2021-03-17 VITALS — BP 149/57 | HR 78 | Ht 63.75 in | Wt 161.8 lb

## 2021-03-17 DIAGNOSIS — Z780 Asymptomatic menopausal state: Secondary | ICD-10-CM | POA: Diagnosis not present

## 2021-03-17 DIAGNOSIS — Z90710 Acquired absence of both cervix and uterus: Secondary | ICD-10-CM | POA: Diagnosis not present

## 2021-03-17 DIAGNOSIS — C50411 Malignant neoplasm of upper-outer quadrant of right female breast: Secondary | ICD-10-CM

## 2021-03-17 DIAGNOSIS — L900 Lichen sclerosus et atrophicus: Secondary | ICD-10-CM

## 2021-03-17 DIAGNOSIS — Z853 Personal history of malignant neoplasm of breast: Secondary | ICD-10-CM

## 2021-03-17 MED ORDER — CLOBETASOL PROPIONATE 0.05 % EX OINT
1.0000 "application " | TOPICAL_OINTMENT | Freq: Two times a day (BID) | CUTANEOUS | 1 refills | Status: DC
  Filled 2021-03-17: qty 60, 15d supply, fill #0
  Filled 2021-04-13: qty 60, 15d supply, fill #1

## 2021-03-17 NOTE — Progress Notes (Signed)
81 y.o. G59P4 Widowed White or Caucasian female here for new patient appointment.  She is accompanied by her daughter today.  She is having a lot of vulvar itching.  This has been an ongoing problems.  Denies vaginal bleeding.  Hysterectomy due to fibroids.  No hx of abnormal pap smears.  Pt living at [facility].  Having some cognitive decline.  Having neuropsychological testing per her daughter.  No LMP recorded. Patient is postmenopausal.          Sexually active: No.  H/O STD:  no  Health Maintenance: PCP:  Dr. Veleta Miners.    Colonoscopy:  done in [place] per her daugher MMG:  06/05/2020 Negative BMD:  unsure of last one.  Can order with next mammogram Last pap smear:  unsure.   H/o abnormal pap smear: no     reports that she has never smoked. She has never used smokeless tobacco. She reports that she does not drink alcohol and does not use drugs.  Past Medical History:  Diagnosis Date   Anemia    Anxiety    Arthritis    Breast cancer ([place]) 1970s   right breast   Depression    Personal history of chemotherapy    Personal history of radiation therapy     Past Surgical History:  Procedure Laterality Date   ABDOMINAL HYSTERECTOMY     BREAST LUMPECTOMY Right 1976   CHOLECYSTECTOMY     JOINT REPLACEMENT     L knee replacement   TONSILLECTOMY     TOTAL HIP ARTHROPLASTY Right 10/26/2019   Procedure: RIGHT TOTAL HIP ARTHROPLASTY ANTERIOR APPROACH;  Surgeon: Mcarthur Rossetti, MD;  Location: [facility];  Service: Orthopedics;  Laterality: Right;   WISDOM TOOTH EXTRACTION      Current Outpatient Medications  Medication Sig Dispense Refill   cycloSPORINE (RESTASIS) 0.05 % ophthalmic emulsion Place 1 drop into both eyes 2 (two) times daily. (Patient not taking: Reported on 02/25/2021)     Polyethyl Glycol-Propyl Glycol (SYSTANE) 0.4-0.3 % GEL ophthalmic gel Place 1 application into both eyes daily as needed. (Patient not taking: Reported on 03/04/2021)     No current  facility-administered medications for this visit.    No family history on file.  Review of Systems  Genitourinary:        Vulvar itching   Exam:   BP (!) 149/57 (BP Location: Right Arm, Patient Position: Sitting, Cuff Size: Large)    Pulse 78    Ht 5' 3.75" (1.619 m)    Wt 161 lb 12.8 oz (73.4 kg)    BMI 27.99 kg/m   Height: 5' 3.75" (161.9 cm)  General appearance: alert, cooperative and appears stated age Breasts: normal appearance, no masses or tenderness Abdomen: soft, non-tender; bowel sounds normal; no masses,  no organomegaly Lymph nodes: Cervical, supraclavicular, and axillary nodes normal.  No abnormal inguinal nodes palpated Neurologic: Grossly normal  Pelvic: External genitalia:  significant hypopigmentation of inner labia majora bilaterally in kissing pattern, area of thickening especially on the left inner labia majora, at mid region              Urethra:  normal appearing urethra with no masses, tenderness or lesions              Bartholins and Skenes: normal                 Vagina: normal appearing vagina with atrophic changes and no discharge, no lesions  Cervix: absent              Pap taken: No. Bimanual Exam:  Uterus:  uterus absent              Adnexa: normal adnexa               Rectovaginal: Confirms               Anus:  normal sphincter tone, no lesions  Chaperone, Octaviano Batty, CMA, was present for exam.  Assessment/Plan: 1. Lichen sclerosus - discussed findings with pt and daughter.  Importance of treatment and follow up discussed.  May need to obtain biopsy at follow up depending on response to treatment - clobetasol ointment (TEMOVATE) 0.05 %; Apply 1 application topically 2 (two) times daily.  Dispense: 60 g; Refill: 1  2. Postmenopausal - no HRT  3. H/O: hysterectomy - pap smears not indicate  4. H/o breast cancer, 1999

## 2021-03-18 DIAGNOSIS — L900 Lichen sclerosus et atrophicus: Secondary | ICD-10-CM | POA: Insufficient documentation

## 2021-03-18 DIAGNOSIS — C50411 Malignant neoplasm of upper-outer quadrant of right female breast: Secondary | ICD-10-CM | POA: Insufficient documentation

## 2021-03-18 DIAGNOSIS — Z90710 Acquired absence of both cervix and uterus: Secondary | ICD-10-CM | POA: Insufficient documentation

## 2021-03-18 HISTORY — DX: Lichen sclerosus et atrophicus: L90.0

## 2021-03-18 HISTORY — DX: Acquired absence of both cervix and uterus: Z90.710

## 2021-03-24 ENCOUNTER — Ambulatory Visit
Admission: RE | Admit: 2021-03-24 | Discharge: 2021-03-24 | Disposition: A | Discharge status: Home / Self Care | Payer: Medicare PPO | Source: Ambulatory Visit | Attending: Physician Assistant | Admitting: Physician Assistant

## 2021-03-24 ENCOUNTER — Other Ambulatory Visit: Payer: Self-pay

## 2021-03-24 DIAGNOSIS — I6782 Cerebral ischemia: Secondary | ICD-10-CM | POA: Diagnosis not present

## 2021-03-24 DIAGNOSIS — Z853 Personal history of malignant neoplasm of breast: Secondary | ICD-10-CM | POA: Diagnosis not present

## 2021-03-24 DIAGNOSIS — R413 Other amnesia: Secondary | ICD-10-CM | POA: Diagnosis not present

## 2021-03-24 DIAGNOSIS — G319 Degenerative disease of nervous system, unspecified: Secondary | ICD-10-CM | POA: Diagnosis not present

## 2021-03-26 ENCOUNTER — Telehealth: Payer: Self-pay | Admitting: Physician Assistant

## 2021-03-26 NOTE — Progress Notes (Signed)
Please, inform patient that the MRI results show chronic aging changes in the vessels and age related atrophy.there is a questionable old tiny infarct in the right side of the head but is not new, masses, fluid or infection is seen.  Recommend strong control of the blood pressure and other cardiovascular risk factors with your primary doctor. Thank you.

## 2021-03-26 NOTE — Telephone Encounter (Signed)
Patients daughter is returning a call to christy

## 2021-03-26 NOTE — Telephone Encounter (Signed)
Spoke with [name], notified daughter of results, who is POA

## 2021-04-02 ENCOUNTER — Telehealth: Payer: Self-pay

## 2021-04-02 NOTE — Telephone Encounter (Signed)
Sorry I got distracted. Tell her daughter I am Comfortable treating her Dementia. She has Follow up appointment with me in April and I can discuss different options with them at that time. Will also Discuss MRI report and Eval from Neurology.

## 2021-04-02 NOTE — Telephone Encounter (Signed)
Dr.Gupta this message was routed back to me. Please advise if any action is needed on my end and if you have spoken with patients daughter or provide your response to message and I will relay it to the patients daughter

## 2021-04-02 NOTE — Telephone Encounter (Signed)
Patients daughter called requesting to speak with Dr.Gupta about MRI ordered by the Neurologist. Per Lyda Kalata was going to consider treatment for patients memory following results. [name] would prefer Dr.Gupta to manage memory as she is onsite with patient versus neurologist.

## 2021-04-02 NOTE — Telephone Encounter (Signed)
Discussed response with patients daughter and [name] asked to move appointment up and it is not scheduled for 05/06/21

## 2021-04-08 ENCOUNTER — Other Ambulatory Visit (HOSPITAL_COMMUNITY)
Admission: RE | Admit: 2021-04-08 | Discharge: 2021-04-08 | Disposition: A | Discharge status: Home / Self Care | Payer: Medicare PPO | Source: Ambulatory Visit | Attending: Obstetrics & Gynecology | Admitting: Obstetrics & Gynecology

## 2021-04-08 ENCOUNTER — Ambulatory Visit (INDEPENDENT_AMBULATORY_CARE_PROVIDER_SITE_OTHER): Payer: Medicare PPO | Admitting: Obstetrics & Gynecology

## 2021-04-08 ENCOUNTER — Encounter (HOSPITAL_BASED_OUTPATIENT_CLINIC_OR_DEPARTMENT_OTHER): Payer: Self-pay | Admitting: Obstetrics & Gynecology

## 2021-04-08 ENCOUNTER — Other Ambulatory Visit: Payer: Self-pay

## 2021-04-08 VITALS — BP 145/62 | HR 81 | Ht 64.5 in | Wt 159.0 lb

## 2021-04-08 DIAGNOSIS — L989 Disorder of the skin and subcutaneous tissue, unspecified: Secondary | ICD-10-CM | POA: Insufficient documentation

## 2021-04-08 DIAGNOSIS — N9089 Other specified noninflammatory disorders of vulva and perineum: Secondary | ICD-10-CM | POA: Diagnosis not present

## 2021-04-08 DIAGNOSIS — L292 Pruritus vulvae: Secondary | ICD-10-CM | POA: Diagnosis not present

## 2021-04-08 DIAGNOSIS — L900 Lichen sclerosus et atrophicus: Secondary | ICD-10-CM | POA: Diagnosis not present

## 2021-04-08 DIAGNOSIS — N904 Leukoplakia of vulva: Secondary | ICD-10-CM | POA: Diagnosis not present

## 2021-04-08 NOTE — Progress Notes (Signed)
GYNECOLOGY  VISIT  CC:   vulvar recheck  HPI: 81 y.o. G43P4000 Widowed White or Caucasian female here for vulvar recheck today.  She was seen 03/17/2021 and findings were consistent with lichen sclerosus.  Pt was experiencing significant itching.  Findings were consistent with lichen sclerosus.  Topical clobetasol was started BID.  Pt reports she is using it but is having some memory issues.  Reports she has more than half a tube left.  She feels she is "cured" due to improvement in itching.  Denies vaginal bleeding.   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   Knee pain 02/22/2017   Low back pain 02/22/2017    Past Medical History:  Diagnosis Date   Anemia    Anxiety    Arthritis    Breast cancer ([place]) 1999   right breast   Depression    Personal history of chemotherapy    Personal history of radiation therapy     Past Surgical History:  Procedure Laterality Date   ABDOMINAL HYSTERECTOMY  1998   fibroid   BREAST LUMPECTOMY Right 1999   CHOLECYSTECTOMY     JOINT REPLACEMENT     L knee replacement   TONSILLECTOMY     TOTAL HIP ARTHROPLASTY Right 10/26/2019   Procedure: RIGHT TOTAL HIP ARTHROPLASTY ANTERIOR APPROACH;  Surgeon: Mcarthur Rossetti, MD;  Location: [facility];  Service: Orthopedics;  Laterality: Right;   WISDOM TOOTH EXTRACTION      MEDS:   Current Outpatient Medications on File Prior to Visit  Medication Sig Dispense Refill   clobetasol ointment (TEMOVATE) 0.53 %  Apply 1 application topically 2 (two) times daily. 60 g 1   No current facility-administered medications on file prior to visit.    ALLERGIES: Patient has no known allergies.  No family history on file.  SH:  widowed, non smoker  Review of Systems  Constitutional: Negative.   Genitourinary: Negative.     PHYSICAL EXAMINATION:    BP (!) 145/62 (BP Location: Right Arm, Patient Position: Sitting, Cuff Size: Large)    Pulse 81    Ht 5' 4.5" (1.638 m) Comment: reported   Wt 159 lb (72.1 kg)    BMI 26.87 kg/m     Physical Exam Exam conducted with a chaperone present.  Constitutional:      Appearance: Normal appearance.  Genitourinary:    Labia:        Right: Lesion (hypopigmentation in symmetric pattern from clitoris down to rectum in kissing pattern) present.        Left: Lesion present.     Lymphadenopathy:     Lower Body: Right inguinal adenopathy present. Left inguinal adenopathy present.  Neurological:     General: No focal deficit present.     Mental Status: She is alert.   Procedure:  Area cleansed with Betadine.  Sterile technique used throughout procedure.  Skin anesthestized with Lidocaine 1% plain; 1.10mL.  4 punch biopsy used to obtain specimen.  Biopsy grasped with pick-ups and excised with scissors.  Adequate hemostasis  obtained with silver nitrate sticks.  Dressing was not applied.  Pt tolerated procedure well.  Chaperone, Octaviano Batty, CMA, was present for exam.  Assessment/Plan: 1. Lichen sclerosus - I do not feel she is using the clobetasol regularly and twice daily as she has more than 1/2 of the tube still remaining.  D/w pt and her daughter to try and use twice daily.  Reminded pt where to apply the ointment - pt and daughter advised there is still a refill left so no new prescsription needed at this time - if biopsy is negative, plan recheck again about 6 weeks  2. Abnormal skin of vulva - Surgical pathology( [place]/ [facility])

## 2021-04-08 NOTE — Patient Instructions (Signed)
Continue the clobetasol twice daily until your follow up.

## 2021-04-10 LAB — SURGICAL PATHOLOGY

## 2021-04-13 ENCOUNTER — Other Ambulatory Visit (HOSPITAL_BASED_OUTPATIENT_CLINIC_OR_DEPARTMENT_OTHER): Payer: Self-pay

## 2021-04-30 DIAGNOSIS — E118 Type 2 diabetes mellitus with unspecified complications: Secondary | ICD-10-CM | POA: Diagnosis not present

## 2021-04-30 LAB — LIPID PANEL
Cholesterol: 245 — AB (ref 0–200)
HDL: 74 — AB (ref 35–70)
LDL Cholesterol: 142
Triglycerides: 144 (ref 40–160)

## 2021-04-30 LAB — COMPREHENSIVE METABOLIC PANEL
Albumin: 4.4 (ref 3.5–5.0)
Calcium: 10.3 (ref 8.7–10.7)
Globulin: 2.8

## 2021-04-30 LAB — BASIC METABOLIC PANEL
BUN: 10 (ref 4–21)
CO2: 26 — AB (ref 13–22)
Chloride: 101 (ref 99–108)
Creatinine: 0.6 (ref 0.5–1.1)
Glucose: 152
Potassium: 4.4 mEq/L (ref 3.5–5.1)
Sodium: 137 (ref 137–147)

## 2021-04-30 LAB — HEMOGLOBIN A1C: Hemoglobin A1C: 7

## 2021-04-30 LAB — HEPATIC FUNCTION PANEL
ALT: 37 U/L — AB (ref 7–35)
AST: 31 (ref 13–35)
Alkaline Phosphatase: 64 (ref 25–125)
Bilirubin, Total: 0.3

## 2021-05-06 ENCOUNTER — Non-Acute Institutional Stay: Payer: Medicare PPO | Admitting: Internal Medicine

## 2021-05-06 ENCOUNTER — Encounter: Payer: Self-pay | Admitting: Internal Medicine

## 2021-05-06 ENCOUNTER — Other Ambulatory Visit: Payer: Self-pay

## 2021-05-06 ENCOUNTER — Other Ambulatory Visit (HOSPITAL_BASED_OUTPATIENT_CLINIC_OR_DEPARTMENT_OTHER): Payer: Self-pay

## 2021-05-06 VITALS — BP 168/88 | HR 79 | Temp 97.2°F | Ht 64.5 in | Wt 161.7 lb

## 2021-05-06 DIAGNOSIS — E118 Type 2 diabetes mellitus with unspecified complications: Secondary | ICD-10-CM | POA: Diagnosis not present

## 2021-05-06 DIAGNOSIS — R030 Elevated blood-pressure reading, without diagnosis of hypertension: Secondary | ICD-10-CM

## 2021-05-06 DIAGNOSIS — F32A Depression, unspecified: Secondary | ICD-10-CM

## 2021-05-06 DIAGNOSIS — E782 Mixed hyperlipidemia: Secondary | ICD-10-CM

## 2021-05-06 DIAGNOSIS — L900 Lichen sclerosus et atrophicus: Secondary | ICD-10-CM

## 2021-05-06 DIAGNOSIS — F03A0 Unspecified dementia, mild, without behavioral disturbance, psychotic disturbance, mood disturbance, and anxiety: Secondary | ICD-10-CM | POA: Diagnosis not present

## 2021-05-06 DIAGNOSIS — E559 Vitamin D deficiency, unspecified: Secondary | ICD-10-CM

## 2021-05-06 MED ORDER — DONEPEZIL HCL 5 MG PO TABS
5.0000 mg | ORAL_TABLET | Freq: Every day | ORAL | 2 refills | Status: DC
  Filled 2021-05-06: qty 30, 30d supply, fill #0
  Filled 2021-06-24: qty 30, 30d supply, fill #1

## 2021-05-06 NOTE — Patient Instructions (Signed)
Get BP checked with Heather weekly and bring with you in 8 weeks ?Will start on Aricept 2.5 mg QD at Night for 2 weeks and then go to 5 mg QD ?

## 2021-05-06 NOTE — Progress Notes (Signed)
? ?Location:  [facility] ?  ?Place of Service:  Clinic ([facility]) ? ?Provider:  ? ?Code Status:  ?Goals of Care:  ? ?  03/04/2021  ?  1:40 PM  ?Advanced Directives  ?Does Patient Have a Medical Advance Directive? Yes  ? ? ? ?Chief Complaint  ?Patient presents with  ? Medical Management of Chronic Issues  ?  Patient returns to the clinic for her 3 month follow up.  ? Quality Metric Gaps  ?  Verified [facility] and [facility] patient is due for Urine micro., Hep C screen, TDAP, Shingrix, PCV, Dexa scan, #4 Covid-19 vaccine, and Flu shot  ? ? ?HPI: Patient is a 81 y.o. Summers seen today for medical management of chronic diseases.   ? ?[place]  ?Came with t Daughter ?Was following with her PCP in [place] Her daughter moved her during Covid ?  ?She has h/o HLD Did not tolerate Statin and was put on repatha for sometime but then off everything now ?H/o Breast Cancer s/p Right Mastectomy ?S/P Right hip Arthroplasty  in 9/21 ?H/o Intraventricular Hemorrhage in 10/16 after a fall ?Diabetes Type 2  ?Depression ?Insomnia ? Dementia ?Neuropsychology Eval 04/22 ?Noticeable since she has moved to [facility] ?Has very Strong family History of Alzheimer. Her Sister and Mother ?Her MMSE in their office was 28/30 Word Recall was 2/3 ?  ?Dementia/MCI Per Neurology ?MMSE in their office 25/30 ?Per daughter repeats herself but maintaining her Apartment and ADLS.  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 ?Diagnosis of Lichen Sclerosis. Symptoms improved on Clobestal ? ?No Other acute issue today ? ? ? ?Past Medical History:  ?Diagnosis Date  ? Anemia   ? Anxiety   ? Arthritis   ? Breast cancer ([place]) 1999  ? right breast  ? Depression   ? Personal history of chemotherapy   ? Personal history of radiation therapy   ? ? ?Past Surgical History:   ?Procedure Laterality Date  ? ABDOMINAL HYSTERECTOMY  1998  ? fibroid  ? BREAST LUMPECTOMY Right 1999  ? CHOLECYSTECTOMY    ? JOINT REPLACEMENT    ? L knee replacement  ? TONSILLECTOMY    ? TOTAL HIP ARTHROPLASTY Right 10/26/2019  ? Procedure: RIGHT TOTAL HIP ARTHROPLASTY ANTERIOR APPROACH;  Surgeon: Mcarthur Rossetti, MD;  Location: [facility];  Service: Orthopedics;  Laterality: Right;  ? WISDOM TOOTH EXTRACTION    ? ? ?No Known Allergies ? ?Outpatient Encounter Medications as of 05/06/2021  ?Medication Sig  ? donepezil (ARICEPT) 5 MG tablet Take 1 tablet (5 mg total) by mouth at bedtime.  ? clobetasol ointment (TEMOVATE) 1.60 % Apply 1 application topically 2 (two) times daily. (Patient not taking: Reported on 05/06/2021)  ? ?No facility-administered encounter medications on file as of 05/06/2021.  ? ? ?Review of Systems:  ?Review of Systems  ?Constitutional:  Negative for activity change and appetite change.  ?HENT: Negative.    ?Respiratory:  Negative for cough and shortness of breath.   ?Cardiovascular:  Negative for leg swelling.  ?Gastrointestinal:  Negative for constipation.  ?Genitourinary: Negative.   ?Musculoskeletal:  Negative for arthralgias, gait problem and myalgias.  ?Skin: Negative.   ?Neurological:  Negative for dizziness and weakness.  ?Psychiatric/Behavioral:  Positive for confusion. Negative for dysphoric mood and sleep disturbance.   ? ?Health Maintenance  ?Topic Date  Due  ? URINE MICROALBUMIN  Never done  ? Hepatitis C Screening  Never done  ? TETANUS/TDAP  Never done  ? Zoster Vaccines- Shingrix (1 of 2) Never done  ? Pneumonia Vaccine 76+ Years old (1 - PCV) Never done  ? DEXA SCAN  Never done  ? COVID-19 Vaccine (3 - Moderna risk series) Jodi/14/2021  ? INFLUENZA VACCINE  Never done  ? HPV VACCINES  Aged Out  ? ? ?Physical Exam: ?Vitals:  ? 05/06/21 0907  ?BP: (!) 168/88  ?Pulse: 79  ?Temp: (!) 97.2 ?F (36.2 ?C)  ?SpO2: 98%  ?Weight: 161 lb 11.2 oz (73.3 kg)  ?Height: 5' 4.5" (1.638 m)   ? ?Body mass index is 27.33 kg/m?Marland Kitchen ?Physical Exam ?Vitals reviewed.  ?Constitutional:   ?   Appearance: Normal appearance.  ?HENT:  ?   Head: Normocephalic.  ?   Nose: Nose normal.  ?   Mouth/Throat:  ?   Mouth: Mucous membranes are moist.  ?   Pharynx: Oropharynx is clear.  ?Eyes:  ?   Pupils: Pupils are equal, round, and reactive to light.  ?Cardiovascular:  ?   Rate and Rhythm: Normal rate and regular rhythm.  ?   Pulses: Normal pulses.  ?   Heart sounds: Normal heart sounds. No murmur heard. ?Pulmonary:  ?   Effort: Pulmonary effort is normal.  ?   Breath sounds: Normal breath sounds.  ?Abdominal:  ?   General: Abdomen is flat. Bowel sounds are normal.  ?   Palpations: Abdomen is soft.  ?Musculoskeletal:     ?   General: No swelling.  ?   Cervical back: Neck supple.  ?Skin: ?   General: Skin is warm.  ?Neurological:  ?   General: No focal deficit present.  ?   Mental Status: She is alert.  ?Psychiatric:     ?   Mood and Affect: Mood normal.     ?   Thought Content: Thought content normal.  ? ? ?Labs reviewed: ?Basic Metabolic Panel: ?Recent Labs  ?  11/04/20 ?0000 04/30/21 ?0000  ?NA 142 137  ?K 4.2 4.4  ?CL 104 101  ?CO2 26* 26*  ?BUN 11 10  ?CREATININE 0.7 0.6  ?CALCIUM 9.4 10.3  ?TSH 2.18  --   ? ?Liver Function Tests: ?Recent Labs  ?  11/04/20 ?0000 04/30/21 ?0000  ?AST 28 31  ?ALT 36* 37*  ?ALKPHOS 74 64  ?ALBUMIN 4.4 4.4  ? ?No results for input(s): LIPASE, AMYLASE in the last 8760 hours. ?No results for input(s): AMMONIA in the last 8760 hours. ?CBC: ?Recent Labs  ?  11/04/20 ?0000  ?WBC 8.1  ?HGB 13.3  ?HCT 39  ?PLT 124*  ? ?Lipid Panel: ?Recent Labs  ?  11/04/20 ?0000 04/30/21 ?0000  ?CHOL 232* 245*  ?HDL 62 74*  ?[place] 141 142  ?TRIG 149 144  ? ?Lab Results  ?Component Value Date  ? HGBA1C 7.0 04/30/2021  ? ? ?Procedures since last visit: ?No results found. ? ?Assessment/Plan ?1. Elevated BP without diagnosis of hypertension ?Check BP by Facility Nurse ?To bring readings next visit ? ?2. Controlled  type 2 diabetes mellitus with complication, without long-term current use of insulin ([place]) ?A1C 7 with Hyperglycemia ?Has stopped her Metformin ?Restart next visit as trying Aricept this visit ? ?3. Mixed hyperlipidemia ?Daughter wants to try Statins again ?Consider Crestor low dose 2/week next visit ? ?4. Mild dementia without behavioral disturbance, psychotic disturbance, mood disturbance, or anxiety, unspecified dementia  type ?Will start on Aricept 2.5 mg for 2 weeks and then 5 mg as suggested by Neurology ?Follow up in 2 months ?5. Depression, unspecified depression type ?Off all meds ?Seems to be doing good ? ?6. Vitamin D deficiency ?Need to restart her supplement ? ?7. Lichen sclerosus ?Continue Clobetasol per Dr Sabra Heck ?8 Needs to bring her Vaccination card next visit  ? ?Follow up for BP, Possible start Metformin and Statin ?Labs/tests ordered:   ?Next appt:  05/06/2021 ? ? ? ? ?

## 2021-05-27 ENCOUNTER — Encounter: Payer: Medicare PPO | Admitting: Internal Medicine

## 2021-06-11 ENCOUNTER — Ambulatory Visit (INDEPENDENT_AMBULATORY_CARE_PROVIDER_SITE_OTHER): Payer: Medicare PPO | Admitting: Psychology

## 2021-06-11 ENCOUNTER — Ambulatory Visit: Payer: Medicare PPO | Admitting: Psychology

## 2021-06-11 ENCOUNTER — Encounter: Payer: Self-pay | Admitting: Psychology

## 2021-06-11 DIAGNOSIS — G3184 Mild cognitive impairment, so stated: Secondary | ICD-10-CM | POA: Diagnosis not present

## 2021-06-11 DIAGNOSIS — R4189 Other symptoms and signs involving cognitive functions and awareness: Secondary | ICD-10-CM

## 2021-06-11 HISTORY — DX: Mild cognitive impairment of uncertain or unknown etiology: G31.84

## 2021-06-11 NOTE — Progress Notes (Signed)
? ?  Psychometrician Note ?  ?Cognitive testing was administered to Jodi Summers by Milana Kidney, B.S. (psychometrist) under the supervision of Dr. Christia Reading, Ph.D., licensed psychologist on 06/11/2021. Jodi Summers did not appear overtly distressed by the testing session per behavioral observation or responses across self-report questionnaires. Rest breaks were offered.  ?  ?The battery of tests administered was selected by Dr. Christia Reading, Ph.D. with consideration to Jodi Summers's current level of functioning, the nature of her symptoms, emotional and behavioral responses during interview, level of literacy, observed level of motivation/effort, and the nature of the referral question. This battery was communicated to the psychometrist. Communication between Dr. Christia Reading, Ph.D. and the psychometrist was ongoing throughout the evaluation and Dr. Christia Reading, Ph.D. was immediately accessible at all times. Dr. Christia Reading, Ph.D. provided supervision to the psychometrist on the date of this service to the extent necessary to assure the quality of all services provided.  ?  ?Jodi Summers will return within approximately 1-2 weeks for an interactive feedback session with Dr. Melvyn Novas at which time her test performances, clinical impressions, and treatment recommendations will be reviewed in detail. Jodi Summers understands she can contact our office should she require our assistance before this time. ? ?A total of 130 minutes of billable time were spent face-to-face with Jodi Summers by the psychometrist. This includes both test administration and scoring time. Billing for these services is reflected in the clinical report generated by Dr. Christia Reading, Ph.D. ? ?This note reflects time spent with the psychometrician and does not include test scores or any clinical interpretations made by Dr. Melvyn Novas. The full report will follow in a separate note.  ?

## 2021-06-11 NOTE — Progress Notes (Signed)
? ?NEUROPSYCHOLOGICAL EVALUATION ?[place] ?[place] Department of Neurology ? ?Date of Evaluation: June 11, 2021 ? ?Reason for Referral:  ? ?LILIANAH BUFFIN is a 81 y.o. right-handed Caucasian female referred by  Sharene Butters, PA-C , to characterize her current cognitive functioning and assist with diagnostic clarity and treatment planning in the context of subjective cognitive decline and a strong family history of Alzheimer's disease.  ? ?Assessment and Plan:  ? ?Clinical Impression(s): ?Ms. Cerritos' pattern of performance is suggestive of impairment across encoding (i.e., learning) and retrieval aspects of memory, with the latter domain representing greater dysfunction. Performance variability was exhibited across confrontation naming, semantic fluency, and recognition/consolidation aspects of memory. Performances were appropriate relative to age-matched peers across processing speed, attention/concentration, executive functioning, safety/judgment, receptive language, phonemic fluency, and visuospatial abilities. Ms. Straw largely denied difficulties completing instrumental activities of daily living (ADLs) independently. However, her family has taken over financial management and bill paying responsibilities and she is in the process of being moved to an assisted living facility. 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 ? ?Relative to her previous evaluation in April 2022, decline was seen across both encoding and retrieval aspects of memory, as well as semantic fluency. Variability across confrontation naming was stable. Performances across all other assessed domains exhibited a good degree of stability when there were appropriate tests to make direct comparisons.  ? ?The etiology of ongoing dysfunction and decline is somewhat  unclear at the present time. With that being said, I do have concerns for an underlying neurodegenerative condition, namely Alzheimer's disease. Both Ms. Lysne and her daughter described progressive cognitive and functional decline, which certainly raises concern for a degenerative illness. Across memory testing, Ms. Allocca was essentially amnestic across all memory tasks after a brief delay and exhibited variability across recognition trials. This suggests the presence of rapid forgetting and concern for an evolving memory storage deficit. These areas of memory dysfunction represent classic patterns seen early on in Alzheimer's disease. Additionally, variability/weakness in confrontation naming and semantic fluency would represent typical disease progression. As such, this condition remains plausible and would be in earlier stages at the present time if truly present. ? ?There could be a mild vascular contribution given prior imaging revealing mild microvascular ischemic changes and a small remote right parietal lobe infarct. However, parietal lobe dysfunction would commonly create visuospatial deficits and primary vascular etiologies often affect processing speed, attention/concentration, and executive functioning. Ms. Picazo performed well across all these domains, making the contribution of cerebrovascular changes questionable. It certainly would not create a largely amnestic memory profile across testing. Cognitive and behavioral characteristics are not concerning for Lewy body dementia, frontotemporal dementia, or a more rare parkinsonian presentation at the present time. As Ms. Russman and her daughter reported cognitive changes for the past few years, it is unlikely that her remote head injury sustained in October 2016 is playing any role in her ongoing functioning. Continued medical monitoring will be important moving forward.  ? ?Recommendations: ?A repeat neuropsychological evaluation in 12-18 months  (or sooner if functional decline is noted) is recommended to assess the trajectory of future cognitive decline should it occur. This will also aid in future efforts towards improved diagnostic clarity. ? ?Ms. Charpentier has already been prescribed a medication aimed to address memory loss and concerns surrounding Alzheimer's disease (i.e., donepezil/Aricept). She is encouraged to continue taking  this medication as prescribed. It is important to highlight that this medication has been shown to slow functional decline in some individuals. There is no current treatment which can stop or reverse cognitive decline when caused by a neurodegenerative illness.  ? ?I would recommend that Ms. Woodstock consistently utilize her hearing aids. There have been published studies which suggest that uncorrected hearing loss is a risk factor for dementia and ongoing cognitive decline.  ? ?Performance across neurocognitive testing is not a strong predictor of an individual's safety operating a motor vehicle. Should her family wish to pursue a formalized driving evaluation, they could reach out to the following agencies: ?[employer] in [place]: [phone] ?[facility]: [phone] ?[facility]: [phone] ?[facility]: [phone] or [phone] ? ?Should there be further progression of deficits over time, Ms. Salonga is unlikely to regain any independent living skills lost. Therefore, it is recommended that she remain as involved as possible in all aspects of household chores, finances, and medication management, with supervision to ensure adequate performance. She will likely benefit from the establishment and maintenance of a routine in order to maximize her functional abilities over time. ? ?It will be important for Ms. Sayer to have another person with her when in situations where she may need to process information, weigh the pros and cons of different options, and make  decisions, in order to ensure that she fully understands and recalls all information to be considered. ? ?If not already done, Ms. Bogen and her family may want to discuss her wishes regarding durable power of attorney and medical decision making, so that she can have input into these choices. Additionally, they may wish to discuss future plans for caretaking and seek out community options for in home/residential care should they become necessary. ? ?Ms. Glasser is encouraged to attend to lifestyle factors for brain health (e.g., regular physical exercise, good nutrition habits, regular participation in cognitively-stimulating activities, and general stress management techniques), which are likely to have benefits for both emotional adjustment and cognition. Optimal control of vascular risk factors (including safe cardiovascular exercise and adherence to dietary recommendations) is encouraged. Continued participation in activities which provide mental stimulation and social interaction is also recommended.  ? ?Important information should be provided to Ms. Hobson in written format in all instances. This information should be placed in a highly frequented and easily visible location within her home to promote recall. External strategies such as written notes in a consistently used memory journal, visual and nonverbal auditory cues such as a calendar on the refrigerator or appointments with alarm, such as on a cell phone, can also help maximize recall. ? ?Review of Records:  ? ?Ms. Ahlgren completed a comprehensive neuropsychological evaluation Norton Pastel, Ph.D.) on 05/27/2020. Results suggested average (if not above average to superior) range functioning across most measures of learning and memory, language, attention and processing speed, visuospatial abilities, and executive functions. Low scores of unclear significance were noted across two tasks (i.e., object naming and visual free recall). Per Dr.  Vikki Ports, these very well may be incidental and were at that point thought to be subclinical. From an emotional standpoint, there were no clear indications of significant clinical psychopathology. Ms. Chanda? per

## 2021-06-12 ENCOUNTER — Encounter: Payer: Self-pay | Admitting: Psychology

## 2021-06-18 ENCOUNTER — Ambulatory Visit (INDEPENDENT_AMBULATORY_CARE_PROVIDER_SITE_OTHER): Payer: Medicare PPO | Admitting: Psychology

## 2021-06-18 DIAGNOSIS — G3184 Mild cognitive impairment, so stated: Secondary | ICD-10-CM

## 2021-06-18 NOTE — Progress Notes (Signed)
? ?  Neuropsychology Feedback Session ?[place] ?[place] Department of Neurology ? ?Reason for Referral:  ? ?Jodi Summers is a 81 y.o. right-handed Caucasian female referred by  Sharene Butters, PA-C , to characterize her current cognitive functioning and assist with diagnostic clarity and treatment planning in the context of subjective cognitive decline and a strong family history of Alzheimer's disease.  ? ?Feedback:  ? ?Jodi Summers completed a comprehensive neuropsychological evaluation on 06/11/2021. Please refer to that encounter for the full report and recommendations. Briefly, results suggested impairment across encoding (i.e., learning) and retrieval aspects of memory, with the latter domain representing greater dysfunction. Performance variability was exhibited across confrontation naming, semantic fluency, and recognition/consolidation aspects of memory. Relative to her previous evaluation in April 2022, decline was seen across both encoding and retrieval aspects of memory, as well as semantic fluency. Variability across confrontation naming was stable. Performances across all other assessed domains exhibited a good degree of stability when there were appropriate tests to make direct comparisons. The etiology of ongoing dysfunction and decline is somewhat unclear at the present time. With that being said, I do have concerns for an underlying neurodegenerative condition, namely Alzheimer's disease. Both Jodi Summers and her daughter described progressive cognitive and functional decline, which certainly raises concern for a degenerative illness. Across memory testing, Jodi Summers was essentially amnestic across all memory tasks after a brief delay and exhibited variability across recognition trials. This suggests the presence of rapid forgetting and concern for an evolving memory storage deficit. These areas of memory dysfunction represent classic patterns seen early on in Alzheimer's  disease. Additionally, variability/weakness in confrontation naming and semantic fluency would represent typical disease progression. As such, this condition remains plausible and would be in earlier stages at the present time if truly present. ? ?Jodi Summers was accompanied by her daughter during the current feedback session. Content of the current session focused on the results of her neuropsychological evaluation. Jodi Summers was given the opportunity to ask questions and her questions were answered. She was encouraged to reach out should additional questions arise. A copy of her report was provided at the conclusion of the visit.  ? ?  ? ?39 minutes were spent conducting the current feedback session with Jodi Summers, billed as one unit [phone].  ?

## 2021-06-19 ENCOUNTER — Ambulatory Visit (HOSPITAL_BASED_OUTPATIENT_CLINIC_OR_DEPARTMENT_OTHER): Payer: Medicare PPO | Admitting: Obstetrics & Gynecology

## 2021-06-23 ENCOUNTER — Telehealth: Payer: Self-pay

## 2021-06-23 NOTE — Telephone Encounter (Addendum)
Patients daughter called to inquire if future appointments need to be rescheduled as her mother has agreed to move to assisted living at [place] from [facility] the week after next. ? ?Jodi Summers was notified of several incidents that were quite concerning that happened at [facility], patient was evaluated by the neurologist on 06/18/21 and is not taking aricept as prescribed. All the listed encounters warrant patients move to assure her safety and wellbeing. ? ?Jodi Summers is asking if Dr.Gupta and Alyse Low agree that appointments pending for 06/29/21 ([facility]) and 07/06/21 (2 month follow-up) need to be pushed back to allow patient to adjust to move. Jodi Summers stated if Dr.Gupta or Alyse Low would like to call her to further discuss that is welcomed.  ? ?Please advise ?

## 2021-06-23 NOTE — Telephone Encounter (Signed)
Sure we can do her Appointment once she has adjusted to [place] ?

## 2021-06-24 ENCOUNTER — Other Ambulatory Visit (HOSPITAL_BASED_OUTPATIENT_CLINIC_OR_DEPARTMENT_OTHER): Payer: Self-pay

## 2021-06-24 NOTE — Telephone Encounter (Signed)
Appointments Rescheduled per patient's request. ?

## 2021-06-29 ENCOUNTER — Encounter: Payer: Medicare PPO | Admitting: Adult Health

## 2021-06-30 ENCOUNTER — Ambulatory Visit (INDEPENDENT_AMBULATORY_CARE_PROVIDER_SITE_OTHER): Payer: Medicare PPO | Admitting: Physician Assistant

## 2021-06-30 ENCOUNTER — Other Ambulatory Visit (HOSPITAL_BASED_OUTPATIENT_CLINIC_OR_DEPARTMENT_OTHER): Payer: Self-pay

## 2021-06-30 ENCOUNTER — Encounter: Payer: Self-pay | Admitting: Physician Assistant

## 2021-06-30 VITALS — BP 144/68 | HR 95 | Ht 64.0 in | Wt 165.4 lb

## 2021-06-30 DIAGNOSIS — G3184 Mild cognitive impairment, so stated: Secondary | ICD-10-CM

## 2021-06-30 MED ORDER — DONEPEZIL HCL 10 MG PO TABS
10.0000 mg | ORAL_TABLET | Freq: Every day | ORAL | 11 refills | Status: DC
  Filled 2021-06-30: qty 30, 30d supply, fill #0

## 2021-06-30 NOTE — Progress Notes (Signed)
? ?Assessment/Plan:  ? ?Mild Cognitive Impairment likely due to Alzheimer's disease ? ?Jodi Summers is a very pleasant 81 y.o. year old RH female with risk factors including  age,history of breast cancer status post right mastectomy chemo/XRT, arthritis, history of intraventricular hemorrhage in October 2016 after a fall,  DM2, depression, insomnia, hypertension, hyperlipidemia, and situational depression seen today fin follow up after Neuropsych evaluation. Testing confirmed mild cognitive impairment likely due to Alzheimer's disease. 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 She is on donepezil 5 mg daily. ?  ?  Recommendations:  ?  ?Increase donepezil to 10 mg daily  given the recent findings. Side effects were discussed ?Follow up in  6 months. ?Repeat neurocognitive testing in 1 year for diagnostic clarity and trajectory of the disease ? ? ?Case discussed with Dr. Delice Lesch who agrees with the plan ? ? ? ? ?Subjective:  ? ? ?Jodi Summers is a very pleasant 81 y.o. RH female  seen today in follow up for memory loss. This patient is accompanied in the office by her  daughter who supplements the history.  Previous records as well as any outside records available were reviewed prior to todays visit.  Patient was last seen at our office on 03/11/21  at which time her MoCA was 25/30. Patient is currently on donepezil 5 mg daily ? ?Any changes in memory since last visit? For the last 2 weeks may be worse, could not remember the grandchildren's name, appointments are harder to remember. She continues to do crossword puzzles without difficulty ?Patient now lives at [place], enjoys it  ?repeats oneself? Especially with appointments, for example, she could not remember today's appointment "where are we going, what are we doing  there  today?"  ?Disoriented when walking into a room?  Patient denies   ?Leaving objects in unusual places?  Patient denies   ?Ambulates  with difficulty?   Patient denies   ?Recent falls?  Patient denies   ?Any head injuries?  Patient denies   ?History of seizures?   Patient denies   ?Wandering behavior?  Patient denies   ?Patient drives?   Patient no longer drives  ?Any mood changes such irritability agitation?  Patient denies   ?Any history of depression?:  Patient denies   ?Hallucinations?  Patient denies   ?Paranoia?  Patient denies   ?Patient reports that he sleeps well without vivid dreams, REM behavior or sleepwalking   ?History of sleep apnea?  Patient denies   ?Any hygiene concerns?  Patient denies   ?Independent of bathing and dressing?  Endorsed  ?Does the patient needs help with medications? Facility manages her medications  ?Who is in charge of the finances? Daughter is in charge ?Any changes in appetite?  Patient denies   ?Patient have trouble swallowing? Patient denies   ?Does the patient cook?  Patient denies   ?Any kitchen accidents such as leaving the stove on? Patient denies   ?Any headaches?  Patient denies   ?The double vision? Patient denies   ?Any focal numbness or tingling?  Patient denies   ?Chronic back pain Patient denies   ?Unilateral weakness?  Patient denies   ?Any tremors?  Patient denies   ?Any history of anosmia?  Patient denies   ?Any incontinence of urine?  Patient denies   ?Any bowel dysfunction?  Patient denies    ? ?  ? ?Initial Visit 03/04/21 The patient is seen in neurologic consultation at the request of [name], MD for the evaluation of memory. The patient is accompanied by [name] who supplements the history. ?This is a 81 y.o. year old RH  female who has had memory issues for about 3 years.  The patient has moved from [place] during the pandemic to be near her daughter, and moved to [facility] independent living, which is reported to be an "exhausting  experience, as she was presented with so many things to do and to many people introducing themselves, which appeared to be overwhelming to her ".  Her daughter reports that this took a toll on her emotional and mental readiness.  Due to a noticeable cognitive decline, her daughter arranged Neuropsych evaluation by Jodi Nakai, PhD from neuropsychology at [facility]) in 05/27/2020 yielding an normal examination.  Over the last 6 to 8 months, her daughter states that short-term memory is worse, sometimes not remembering that she asked the same question, or told the same story.  She has not been sleeping well either, tried low dose of melatonin without significant improvement.  Denies any vivid dreams, REM behavior, or sleepwalking.  She denies any hallucinations, but she uses a cane "just in case I need to fight somebody that tries to get in the apartment ".  She denies any confusion when coming to her room, or leaving objects in unusual places.  There is some degree of depression due to all this changes.  She enjoys doing crossword puzzles and word finding.  She still trying to adjust to independent living facility.  In October 2022 she had COVID, and was in isolation for about 10 days, which in turn appeared to be therapeutic, as she did not need to interact with other residents.  There are no hygiene concerns, she is independent of bathing and dressing.  Her medications are in a pillbox, occasionally she may forget the dose.  Daughter is in charge of the finances.  Appetite is fairly good, denies trouble swallowing.  She denies any recent falls.  She sustained a head injury in October 2016 after falling in the parking lot of her [employer], with loss of consciousness but with no reported retroactive amnesia, with intraventricular hemorrhage needing hospitalization.  She had posttraumatic amnestic period of less than 4 hours.  Denies headaches, double vision, dizziness, focal numbness or  tingling, unilateral weakness or tremors or anosmia. No history of seizures. Denies urine incontinence, retention, constipation or diarrhea.  Denies OSA, ETOH or Tobacco. Family History strong for Alzheimer's disease in sister and mother.  She is a retired Pharmacist, hospital ?  ? CT scan performed in July 2016 reportedly revealed a linear hyperdensity within the posterior aspect of the lateral ventricles consistent with a small amount of intraventricular hemorrhage. There was also minimal blood products layering in the posterior horns of the lateral ventricles. Moreover, there was a 2 mm subdural hematoma lateral to the right temporal lobe. It was purportedly not hyperdense and therefore could be subacute. Focal hyperdensity in the right sylvian fissure  which possibly represent a minimal amount of traumatic subarachnoid hemorrhage. ? ?A brain MRI scan performed in July 2016 reportedly revealed global parenchymal brain volume loss mildly advanced for age and mild white matter changes compatible with chronic small vessel ischemic disease. ? ? 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 ?Neurocognitive testing 06/11/21 , Dr. Melvyn Novas. Briefly, results suggested impairment across encoding (i.e., learning) and retrieval aspects of memory, with the latter domain representing greater dysfunction. Performance variability was exhibited across confrontation naming, semantic fluency, and recognition/consolidation aspects of memory. Relative to her previous evaluation in April 2022, decline was seen across both encoding and retrieval aspects of memory, as well as semantic fluency. Variability across confrontation naming was stable. Performances across all other assessed domains exhibited a good degree of stability when there were appropriate tests to make direct comparisons. The etiology of ongoing  dysfunction and decline is somewhat unclear at the present time. With that being said, I do have concerns for an underlying neurodegenerative condition, namely Alzheimer's disease. Both Ms. Tatham and her daughter describe

## 2021-06-30 NOTE — Patient Instructions (Signed)
It was a pleasure to see you today at our office.  ? ?Recommendations: ? ?Follow up in 6  months ? donepezil 10 mg daily. Side effects were discussed  ?  ? ?Whom to call: ? ?Memory  decline, memory medications: Call out office [phone]  ? ?For psychiatric meds, mood meds: Please have your primary care physician manage these medications.  ? ?Counseling regarding caregiver distress, including caregiver depression, anxiety and issues regarding community resources, adult day care programs, adult living facilities, or memory care questions:   Feel free to contact Neville, Social Worker at [phone] ?  ?For assessment of decision of mental capacity and competency:  Call Dr. Anthoney Harada, geriatric psychiatrist at [phone] ? ?For guidance in geriatric dementia issues please call [facility] [phone] ? ? ?If you have any severe symptoms of a stroke, or other severe issues such as confusion,severe chills or fever, etc call 911 or go to the ER as you may need to be evaluate further ? ? ?Feel free to visit Facebook page " [email or web address]" for tips of how to care for people with memory problems.  ? ?Feel free to go to the following database for funded clinical studies conducted around the world: ?http://saunders.com/   https://www.triadclinicaltrials.com/ ?  ? ? ?RECOMMENDATIONS FOR ALL PATIENTS WITH MEMORY PROBLEMS: ?1. Continue to exercise (Recommend 30 minutes of walking everyday, or 3 hours every week) ?2. Increase social interactions - continue going to [place] and enjoy social gatherings with friends and family ?3. Eat healthy, avoid fried foods and eat more fruits and vegetables ?4. Maintain adequate blood pressure, blood sugar, and blood cholesterol level. Reducing the risk of stroke and cardiovascular disease also helps promoting better memory. ?5. Avoid stressful situations. Live a simple life and avoid aggravations. Organize your time and prepare for the next day in  anticipation. ?6. Sleep well, avoid any interruptions of sleep and avoid any distractions in the bedroom that may interfere with adequate sleep quality ?7. Avoid sugar, avoid sweets as there is a strong link between excessive sugar intake, diabetes, and cognitive impairment ?We discussed the Mediterranean diet, which has been shown to help patients reduce the risk of progressive memory disorders and reduces cardiovascular risk. This includes eating fish, eat fruits and green leafy vegetables, nuts like almonds and hazelnuts, walnuts, and also use olive oil. Avoid fast foods and fried foods as much as possible. Avoid sweets and sugar as sugar use has been linked to worsening of memory function. ? ?There is always a concern of gradual progression of memory problems. If this is the case, then we may need to adjust level of care according to patient needs. Support, both to the patient and caregiver, should then be put into place.  ? ? ?The Alzheimer?s Association is here all day, every day for people facing Alzheimer?s disease through our free 24/7 Helpline: [phone]. The Helpline provides reliable information and support to all those who need assistance, such as individuals living with memory loss, Alzheimer's or other dementia, caregivers, health care professionals and the public.  ?Our highly trained and knowledgeable staff can help you with: ?Understanding memory loss, dementia and Alzheimer's  ?Medications and other treatment options  ?General information about aging and brain health  ?Skills to provide quality care and to find the best care from professionals  ?Legal, financial and living-arrangement decisions ?Our Helpline also features: ?Confidential care consultation provided by master's level clinicians who can help with decision-making support, crisis assistance and education on issues  families face every day  ?Help in a caller's preferred language using our translation service that features more than 200  languages and dialects  ?Referrals to local community programs, services and ongoing support ? ? ? ? ?FALL PRECAUTIONS: Be cautious when walking. Scan the area for obstacles that may increase the risk of trips and falls. When getting up in the mornings, sit up at the edge of the bed for a few minutes before getting out of bed. Consider elevating the bed at the head end to avoid drop of blood pressure when getting up. Walk always in a well-lit room (use night lights in the walls). Avoid area rugs or power cords from appliances in the middle of the walkways. Use a walker or a cane if necessary and consider physical therapy for balance exercise. Get your eyesight checked regularly. ? ?FINANCIAL OVERSIGHT: Supervision, especially oversight when making financial decisions or transactions is also recommended. ? ?HOME SAFETY: Consider the safety of the kitchen when operating appliances like stoves, microwave oven, and blender. Consider having supervision and share cooking responsibilities until no longer able to participate in those. Accidents with firearms and other hazards in the house should be identified and addressed as well. ? ? ?ABILITY TO BE LEFT ALONE: If patient is unable to contact 911 operator, consider using LifeLine, or when the need is there, arrange for someone to stay with patients. Smoking is a fire hazard, consider supervision or cessation. Risk of wandering should be assessed by caregiver and if detected at any point, supervision and safe proof recommendations should be instituted. ? ?  ? ?  ? ?  ? ? ?Mediterranean Diet ?A Mediterranean diet refers to food and lifestyle choices that are based on the traditions of countries located on the [employer]. This way of eating has been shown to help prevent certain conditions and improve outcomes for people who have chronic diseases, like kidney disease and heart disease. ?What are tips for following this plan? ?Lifestyle  ?Cook and eat meals together  with your family, when possible. ?Drink enough fluid to keep your urine clear or pale yellow. ?Be physically active every day. This includes: ?Aerobic exercise like running or swimming. ?Leisure activities like gardening, walking, or housework. ?Get 7-8 hours of sleep each night. ?If recommended by your health care provider, drink red wine in moderation. This means 1 glass a day for nonpregnant women and 2 glasses a day for men. A glass of wine equals 5 oz (150 mL). ?Reading food labels  ?Check the serving size of packaged foods. For foods such as rice and pasta, the serving size refers to the amount of cooked product, not dry. ?Check the total fat in packaged foods. Avoid foods that have saturated fat or trans fats. ?Check the ingredients list for added sugars, such as corn syrup. ?Shopping  ?At the grocery store, buy most of your food from the areas near the walls of the store. This includes: ?Fresh fruits and vegetables (produce). ?Grains, beans, nuts, and seeds. Some of these may be available in unpackaged forms or large amounts (in bulk). ?Fresh seafood. ?Poultry and eggs. ?Low-fat dairy products. ?Buy whole ingredients instead of prepackaged foods. ?Buy fresh fruits and vegetables in-season from local farmers markets. ?Buy frozen fruits and vegetables in resealable bags. ?If you do not have access to quality fresh seafood, buy precooked frozen shrimp or canned fish, such as tuna, salmon, or sardines. ?Buy small amounts of raw or cooked vegetables, salads, or olives from the deli  or salad bar at your store. ?Stock your pantry so you always have certain foods on hand, such as olive oil, canned tuna, canned tomatoes, rice, pasta, and beans. ?Cooking  ?Cook foods with extra-virgin olive oil instead of using butter or other vegetable oils. ?Have meat as a side dish, and have vegetables or grains as your main dish. This means having meat in small portions or adding small amounts of meat to foods like pasta or  stew. ?Use beans or vegetables instead of meat in common dishes like chili or lasagna. ?Experiment with different cooking methods. Try roasting or broiling vegetables instead of steaming or saut?eing them. ?Add frozen v

## 2021-07-06 ENCOUNTER — Encounter: Payer: Medicare PPO | Admitting: Adult Health

## 2021-07-15 ENCOUNTER — Telehealth: Payer: Self-pay | Admitting: *Deleted

## 2021-07-15 NOTE — Telephone Encounter (Signed)
[name], daughter called and stated that she just wanted to make you aware:  1.) Patient has been moved to [place] due to Worsening of Memory and status change. Stated that patient's Aricept has been Increased to '10mg'$   2.) Wanted to make you aware that they have applied for [place] through [place] and will be sending you paperwork.    FYI

## 2021-07-17 ENCOUNTER — Encounter: Payer: Self-pay | Admitting: Adult Health

## 2021-07-20 ENCOUNTER — Non-Acute Institutional Stay: Payer: Medicare PPO | Admitting: Adult Health

## 2021-07-20 ENCOUNTER — Encounter: Payer: Self-pay | Admitting: Adult Health

## 2021-07-20 VITALS — BP 128/72 | HR 92 | Temp 97.9°F | Ht 64.0 in

## 2021-07-20 DIAGNOSIS — E118 Type 2 diabetes mellitus with unspecified complications: Secondary | ICD-10-CM

## 2021-07-20 DIAGNOSIS — E782 Mixed hyperlipidemia: Secondary | ICD-10-CM | POA: Diagnosis not present

## 2021-07-20 DIAGNOSIS — F4321 Adjustment disorder with depressed mood: Secondary | ICD-10-CM

## 2021-07-20 DIAGNOSIS — G3184 Mild cognitive impairment, so stated: Secondary | ICD-10-CM | POA: Diagnosis not present

## 2021-07-20 HISTORY — DX: Type 2 diabetes mellitus with unspecified complications: E11.8

## 2021-07-20 MED ORDER — SERTRALINE HCL 25 MG PO TABS: 25.0000 mg | ORAL_TABLET | Freq: Every day | ORAL | 3 refills | Status: DC

## 2021-07-20 NOTE — Progress Notes (Signed)
Location:  [facility]  POS: Clinic  Provider: Royal Hawthorn, ANP  Code Status:  Goals of Care:     07/17/2021    4:21 PM  Advanced Directives  Does Patient Have a Medical Advance Directive? Yes  Type of Paramedic of [place];Living will  Does patient want to make changes to medical advance directive? No - Patient declined  Copy of [place] in Chart? Yes - validated most recent copy scanned in chart (See row information)     Chief Complaint  Patient presents with   Medicare Wellness    AWV-MMSE/Clock   Quality Metric Gaps    Verified [facility] and [facility] patient is due for #4 covid vaccine, Dexa scan, PCV, Shingrix, TDAP, Hep C screen and urine micro.    HPI: Patient is a 81 y.o. female seen today for medical management of chronic diseases.    PMH signficant for post right mastectomy chemo/XRT, arthritis, history of intraventricular hemorrhage after a fall,  DM2, depression, insomnia, hypertension, hyperlipidemia, and depression  MCI followed by neurology [facility] 03/10/21 25/30 on aricept. Dose increased to 10 mg 5/16. Repeat neuro cognitive testing recommended in 1 year MMSE 24/30 07/20/2021, passed pentagons and clock   Recently moved to assisted living.  Her daughter reports that she is having trouble sleeping  Some fear of people coming in her room, She is barricading her room to prevent people from coming in her room Uses a cane Her daughter reports she has had anxiety/depression for years. Has tried meds in the past.  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   Hyperlipidemia 11/30/2018   Knee pain  08/15/1599   Lichen sclerosus 09/32/3557   Low back pain 02/22/2017   Malignant neoplasm of upper-outer quadrant of right female breast 1999   Pain in toe 12/06/2019   Personal history of chemotherapy    Personal history of radiation therapy    Unilateral primary osteoarthritis, 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    Past Surgical History:  Procedure Laterality Date   ABDOMINAL HYSTERECTOMY  1998   fibroid   BREAST LUMPECTOMY Right 1999   CHOLECYSTECTOMY     JOINT REPLACEMENT     L knee replacement   TONSILLECTOMY     TOTAL HIP ARTHROPLASTY Right 10/26/2019   Procedure: RIGHT TOTAL HIP ARTHROPLASTY ANTERIOR APPROACH;  Surgeon: Mcarthur Rossetti, MD;  Location: [facility];  Service: Orthopedics;  Laterality: Right;   WISDOM TOOTH EXTRACTION      No Known Allergies  Outpatient Encounter Medications as of 07/20/2021  Medication Sig   sertraline (ZOLOFT) 25 MG tablet Take 1 tablet (25 mg total) by mouth at bedtime. 25 mg qhs x 2 weeks then increase to 50 mg   clobetasol ointment (TEMOVATE) 3.22 % Apply 1 application topically 2 (two) times daily.   donepezil (ARICEPT) 10 MG tablet Take 1 tablet (10 mg total) by mouth at bedtime.   No facility-administered encounter medications on file as of 07/20/2021.    Review of Systems:  Review of Systems  Constitutional:  Negative for activity change, appetite change, chills, diaphoresis, fatigue, fever and unexpected weight change.  HENT:  Negative for congestion.  Respiratory:  Negative for cough, shortness of breath and wheezing.   Cardiovascular:  Negative for chest pain, palpitations and leg swelling.  Gastrointestinal:  Negative for abdominal distention, abdominal pain, constipation and diarrhea.  Genitourinary:  Negative for difficulty urinating and dysuria.  Musculoskeletal:  Positive for gait problem. Negative for arthralgias, back pain, joint swelling and myalgias.  Neurological:  Negative for dizziness, tremors, seizures, syncope, facial  asymmetry, speech difficulty, weakness, light-headedness, numbness and headaches.  Psychiatric/Behavioral:  Positive for dysphoric mood and sleep disturbance. Negative for agitation, behavioral problems and confusion. The patient is nervous/anxious.        Memory loss    Health Maintenance  Topic Date Due   URINE MICROALBUMIN  Never done   Hepatitis C Screening  Never done   TETANUS/TDAP  Never done   Zoster Vaccines- Shingrix (1 of 2) Never done   Pneumonia Vaccine 67+ Years old (1 - PCV) Never done   DEXA SCAN  Never done   COVID-19 Vaccine (3 - Moderna risk series) 01/29/2020   INFLUENZA VACCINE  09/15/2021   HPV VACCINES  Aged Out    Physical Exam: Vitals:   07/20/21 1410  BP: 128/72  Pulse: 92  Temp: 97.9 F (36.6 C)  SpO2: 95%  Height: '5\' 4"'$  (1.626 m)   Body mass index is 28.39 kg/m. Physical Exam Vitals and nursing note reviewed.  Constitutional:      General: She is not in acute distress.    Appearance: She is not diaphoretic.  HENT:     Head: Normocephalic and atraumatic.     Right Ear: Tympanic membrane normal. There is no impacted cerumen.     Left Ear: Tympanic membrane normal. There is no impacted cerumen.     Nose: Nose normal.     Mouth/Throat:     Mouth: Mucous membranes are moist.     Pharynx: Oropharynx is clear.  Eyes:     Conjunctiva/sclera: Conjunctivae normal.     Pupils: Pupils are equal, round, and reactive to light.  Neck:     Vascular: No JVD.  Cardiovascular:     Rate and Rhythm: Normal rate and regular rhythm.     Heart sounds: No murmur heard. Pulmonary:     Effort: Pulmonary effort is normal. No respiratory distress.     Breath sounds: Normal breath sounds. No wheezing.  Abdominal:     General: Bowel sounds are normal. There is no distension.     Palpations: Abdomen is soft.  Musculoskeletal:     Cervical back: No rigidity or tenderness.     Right lower leg: No edema.     Left lower leg: No edema.  Lymphadenopathy:      Cervical: No cervical adenopathy.  Skin:    General: Skin is warm and dry.  Neurological:     Mental Status: She is alert and oriented to person, place, and time.  Psychiatric:        Mood and Affect: Mood normal.    Labs reviewed: Basic Metabolic Panel: Recent Labs    11/04/20 0000 04/30/21 0000  NA 142 137  K 4.2 4.4  CL 104 101  CO2 26* 26*  BUN 11 10  CREATININE 0.7 0.6  CALCIUM 9.4 10.3  TSH 2.18  --    Liver Function Tests: Recent Labs    11/04/20 0000 04/30/21 0000  AST 28 31  ALT 36* 37*  ALKPHOS 74 64  ALBUMIN 4.4 4.4   No results for input(s): LIPASE, AMYLASE in  the last 8760 hours. No results for input(s): AMMONIA in the last 8760 hours. CBC: Recent Labs    11/04/20 0000  WBC 8.1  HGB 13.3  HCT 39  PLT 124*   Lipid Panel: Recent Labs    11/04/20 0000 04/30/21 0000  CHOL 232* 245*  HDL 62 74*  LDLCALC 141 142  TRIG 149 144   Lab Results  Component Value Date   HGBA1C 7.0 04/30/2021    Procedures since last visit: No results found.  Assessment/Plan  1. Situational depression Experiencing feelings of anxiety and depression with new move (but has had symptoms for ears) Will try zoloft and check BMP prior to next apt - sertraline (ZOLOFT) 25 MG tablet; Take 1 tablet (25 mg total) by mouth at bedtime. 25 mg qhs x 2 weeks then increase to 50 mg  Dispense: 30 tablet; Refill: 3  2. Amnestic MCI (mild cognitive impairment with memory loss) Progressing On aricept 10 and tolerating well Followed by neurology   3. Mixed hyperlipidemia Lab Results  Component Value Date   LDLCALC 142 04/30/2021   Off repatha Continue to monitor and consider starting statin if worsening.    4. Controlled type 2 diabetes mellitus with complication, without long-term current use of insulin (HCC) Off metformin, A1C 7.0 Starting zoloft Will check A1C at next visit and consider resuming metformin.   Labs/tests ordered:  * No order type specified * BMP in  1 week due to zoloft A1C prior to apt Next appt:  f/u 6 weeks with Dr Lyndel Safe    Total time 72mn:  time greater than 50% of total time spent doing pt counseling and coordination of care

## 2021-07-27 ENCOUNTER — Telehealth: Payer: Self-pay | Admitting: Internal Medicine

## 2021-07-27 DIAGNOSIS — E118 Type 2 diabetes mellitus with unspecified complications: Secondary | ICD-10-CM | POA: Diagnosis not present

## 2021-07-27 DIAGNOSIS — E785 Hyperlipidemia, unspecified: Secondary | ICD-10-CM | POA: Diagnosis not present

## 2021-07-27 LAB — COMPREHENSIVE METABOLIC PANEL
Calcium: 9.1 (ref 8.7–10.7)
eGFR: >90

## 2021-07-27 LAB — BASIC METABOLIC PANEL
BUN: 8 (ref 4–21)
Creatinine: 0.6 (ref 0.5–1.1)
Glucose: 157
Sodium: 137 (ref 137–147)

## 2021-07-27 LAB — HEMOGLOBIN A1C: Hemoglobin A1C: 8

## 2021-07-27 NOTE — Telephone Encounter (Signed)
Labs show A1C of 8 Sodium is 137 Will restart her on Metformin 500 mg BID  Accu check Q qweekly

## 2021-07-29 ENCOUNTER — Encounter: Payer: Medicare PPO | Admitting: Internal Medicine

## 2021-08-31 ENCOUNTER — Emergency Department (HOSPITAL_COMMUNITY): Payer: Medicare PPO

## 2021-08-31 ENCOUNTER — Emergency Department (HOSPITAL_COMMUNITY)
Admission: EM | Admit: 2021-08-31 | Discharge: 2021-09-01 | Disposition: A | Discharge status: Home / Self Care | Payer: Medicare PPO | Source: Home / Self Care | Attending: Emergency Medicine | Admitting: Emergency Medicine

## 2021-08-31 ENCOUNTER — Other Ambulatory Visit: Payer: Self-pay

## 2021-08-31 ENCOUNTER — Encounter (HOSPITAL_COMMUNITY): Payer: Self-pay

## 2021-08-31 DIAGNOSIS — E119 Type 2 diabetes mellitus without complications: Secondary | ICD-10-CM | POA: Diagnosis not present

## 2021-08-31 DIAGNOSIS — F0390 Unspecified dementia without behavioral disturbance: Secondary | ICD-10-CM | POA: Diagnosis not present

## 2021-08-31 DIAGNOSIS — M25531 Pain in right wrist: Secondary | ICD-10-CM | POA: Diagnosis not present

## 2021-08-31 DIAGNOSIS — R55 Syncope and collapse: Secondary | ICD-10-CM | POA: Diagnosis not present

## 2021-08-31 DIAGNOSIS — R7309 Other abnormal glucose: Secondary | ICD-10-CM | POA: Insufficient documentation

## 2021-08-31 DIAGNOSIS — Z853 Personal history of malignant neoplasm of breast: Secondary | ICD-10-CM | POA: Diagnosis not present

## 2021-08-31 DIAGNOSIS — R569 Unspecified convulsions: Secondary | ICD-10-CM | POA: Insufficient documentation

## 2021-08-31 DIAGNOSIS — Z96652 Presence of left artificial knee joint: Secondary | ICD-10-CM | POA: Diagnosis not present

## 2021-08-31 DIAGNOSIS — D72829 Elevated white blood cell count, unspecified: Secondary | ICD-10-CM | POA: Diagnosis not present

## 2021-08-31 DIAGNOSIS — E871 Hypo-osmolality and hyponatremia: Secondary | ICD-10-CM | POA: Diagnosis not present

## 2021-08-31 DIAGNOSIS — Z96641 Presence of right artificial hip joint: Secondary | ICD-10-CM | POA: Diagnosis not present

## 2021-08-31 DIAGNOSIS — S199XXA Unspecified injury of neck, initial encounter: Secondary | ICD-10-CM | POA: Diagnosis not present

## 2021-08-31 DIAGNOSIS — R58 Hemorrhage, not elsewhere classified: Secondary | ICD-10-CM | POA: Diagnosis not present

## 2021-08-31 DIAGNOSIS — W19XXXA Unspecified fall, initial encounter: Secondary | ICD-10-CM | POA: Insufficient documentation

## 2021-08-31 DIAGNOSIS — R7401 Elevation of levels of liver transaminase levels: Secondary | ICD-10-CM | POA: Diagnosis not present

## 2021-08-31 DIAGNOSIS — I10 Essential (primary) hypertension: Secondary | ICD-10-CM | POA: Diagnosis not present

## 2021-08-31 DIAGNOSIS — M11231 Other chondrocalcinosis, right wrist: Secondary | ICD-10-CM | POA: Diagnosis not present

## 2021-08-31 DIAGNOSIS — R61 Generalized hyperhidrosis: Secondary | ICD-10-CM | POA: Diagnosis not present

## 2021-08-31 DIAGNOSIS — R410 Disorientation, unspecified: Secondary | ICD-10-CM | POA: Insufficient documentation

## 2021-08-31 DIAGNOSIS — M19041 Primary osteoarthritis, right hand: Secondary | ICD-10-CM | POA: Diagnosis not present

## 2021-08-31 DIAGNOSIS — M19031 Primary osteoarthritis, right wrist: Secondary | ICD-10-CM | POA: Diagnosis not present

## 2021-08-31 DIAGNOSIS — S0990XA Unspecified injury of head, initial encounter: Secondary | ICD-10-CM | POA: Diagnosis not present

## 2021-08-31 LAB — CBC WITH DIFFERENTIAL/PLATELET
Abs Immature Granulocytes: 0.08 10*3/uL — ABNORMAL HIGH (ref 0.00–0.07)
Basophils Absolute: 0.1 10*3/uL (ref 0.0–0.1)
Basophils Relative: 1 %
Eosinophils Absolute: 0.1 10*3/uL (ref 0.0–0.5)
Eosinophils Relative: 1 %
HCT: 36.8 % (ref 36.0–46.0)
Hemoglobin: 12.5 g/dL (ref 12.0–15.0)
Immature Granulocytes: 1 %
Lymphocytes Relative: 12 %
Lymphs Abs: 1.5 10*3/uL (ref 0.7–4.0)
MCH: 30.6 pg (ref 26.0–34.0)
MCHC: 34 g/dL (ref 30.0–36.0)
MCV: 90.2 fL (ref 80.0–100.0)
Monocytes Absolute: 0.9 10*3/uL (ref 0.1–1.0)
Monocytes Relative: 7 %
Neutro Abs: 10 10*3/uL — ABNORMAL HIGH (ref 1.7–7.7)
Neutrophils Relative %: 78 %
Platelets: 238 10*3/uL (ref 150–400)
RBC: 4.08 MIL/uL (ref 3.87–5.11)
RDW: 12.4 % (ref 11.5–15.5)
WBC: 12.6 10*3/uL — ABNORMAL HIGH (ref 4.0–10.5)
nRBC: 0 % (ref 0.0–0.2)

## 2021-08-31 LAB — CBG MONITORING, ED: Glucose-Capillary: 161 mg/dL — ABNORMAL HIGH (ref 70–99)

## 2021-08-31 NOTE — ED Triage Notes (Signed)
Pt arrived from [facility] via [facility] status post fall hitting face first followed by seizure like activity. Pt said that she was hot and then fell. A and O x 3

## 2021-08-31 NOTE — ED Provider Notes (Signed)
  [facility] Provider Note   CSN: [id number] Arrival date & time: 08/31/21  2222     History {Add pertinent medical, surgical, social history, OB history to HPI:1} Chief Complaint  Patient presents with   Jodi Summers is a 81 y.o. female.   Fall       Home Medications Prior to Admission medications   Medication Sig Start Date End Date Taking? Authorizing Provider  clobetasol ointment (TEMOVATE) 8.24 % Apply 1 application topically 2 (two) times daily. 03/17/21   Megan Salon, MD  donepezil (ARICEPT) 10 MG tablet Take 1 tablet (10 mg total) by mouth at bedtime. 06/30/21   Rondel Jumbo, PA-C  sertraline (ZOLOFT) 25 MG tablet Take 1 tablet (25 mg total) by mouth at bedtime. 25 mg qhs x 2 weeks then increase to 50 mg 07/20/21   Royal Hawthorn, NP      Allergies    Patient has no known allergies.    Review of Systems   Review of Systems  Physical Exam Updated Vital Signs BP (!) 155/55   Pulse 67   Resp 16   SpO2 97%  Physical Exam  ED Results / Procedures / Treatments   Labs (all labs ordered are listed, but only abnormal results are displayed) Labs Reviewed - No data to display  EKG None  Radiology No results found.  Procedures Procedures  {Document cardiac monitor, telemetry assessment procedure when appropriate:1}  Medications Ordered in ED Medications - No data to display  ED Course/ Medical Decision Making/ A&P                           Medical Decision Making  ***  {Document critical care time when appropriate:1} {Document review of labs and clinical decision tools ie heart score, Chads2Vasc2 etc:1}  {Document your independent review of radiology images, and any outside records:1} {Document your discussion with family members, caretakers, and with consultants:1} {Document social determinants of health affecting pt's care:1} {Document your decision making why or why not admission, treatments  were needed:1} Final Clinical Impression(s) / ED Diagnoses Final diagnoses:  None    Rx / DC Orders ED Discharge Orders     None

## 2021-09-01 ENCOUNTER — Encounter (HOSPITAL_COMMUNITY): Payer: Self-pay

## 2021-09-01 ENCOUNTER — Other Ambulatory Visit: Payer: Self-pay

## 2021-09-01 ENCOUNTER — Emergency Department (HOSPITAL_COMMUNITY): Payer: Medicare PPO

## 2021-09-01 ENCOUNTER — Observation Stay (HOSPITAL_COMMUNITY)
Admission: EM | Admit: 2021-09-01 | Discharge: 2021-09-02 | Disposition: A | Discharge status: Other Acute Inpatient Hospital | Payer: Medicare PPO | Attending: Student | Admitting: Student

## 2021-09-01 DIAGNOSIS — Z853 Personal history of malignant neoplasm of breast: Secondary | ICD-10-CM | POA: Insufficient documentation

## 2021-09-01 DIAGNOSIS — E119 Type 2 diabetes mellitus without complications: Secondary | ICD-10-CM | POA: Insufficient documentation

## 2021-09-01 DIAGNOSIS — E872 Acidosis, unspecified: Secondary | ICD-10-CM | POA: Diagnosis not present

## 2021-09-01 DIAGNOSIS — R7401 Elevation of levels of liver transaminase levels: Secondary | ICD-10-CM | POA: Diagnosis not present

## 2021-09-01 DIAGNOSIS — R55 Syncope and collapse: Secondary | ICD-10-CM | POA: Diagnosis not present

## 2021-09-01 DIAGNOSIS — R569 Unspecified convulsions: Secondary | ICD-10-CM | POA: Insufficient documentation

## 2021-09-01 DIAGNOSIS — W19XXXA Unspecified fall, initial encounter: Secondary | ICD-10-CM

## 2021-09-01 DIAGNOSIS — W170XXA Fall into well, initial encounter: Secondary | ICD-10-CM | POA: Insufficient documentation

## 2021-09-01 DIAGNOSIS — S63501A Unspecified sprain of right wrist, initial encounter: Secondary | ICD-10-CM | POA: Diagnosis not present

## 2021-09-01 DIAGNOSIS — I951 Orthostatic hypotension: Secondary | ICD-10-CM | POA: Diagnosis present

## 2021-09-01 DIAGNOSIS — E118 Type 2 diabetes mellitus with unspecified complications: Secondary | ICD-10-CM | POA: Diagnosis present

## 2021-09-01 DIAGNOSIS — M25531 Pain in right wrist: Secondary | ICD-10-CM | POA: Diagnosis not present

## 2021-09-01 DIAGNOSIS — D72829 Elevated white blood cell count, unspecified: Secondary | ICD-10-CM

## 2021-09-01 DIAGNOSIS — R7309 Other abnormal glucose: Secondary | ICD-10-CM | POA: Insufficient documentation

## 2021-09-01 DIAGNOSIS — S199XXA Unspecified injury of neck, initial encounter: Secondary | ICD-10-CM | POA: Diagnosis not present

## 2021-09-01 DIAGNOSIS — R410 Disorientation, unspecified: Secondary | ICD-10-CM | POA: Insufficient documentation

## 2021-09-01 DIAGNOSIS — F0390 Unspecified dementia without behavioral disturbance: Secondary | ICD-10-CM | POA: Insufficient documentation

## 2021-09-01 DIAGNOSIS — E871 Hypo-osmolality and hyponatremia: Secondary | ICD-10-CM | POA: Diagnosis not present

## 2021-09-01 DIAGNOSIS — Z96641 Presence of right artificial hip joint: Secondary | ICD-10-CM | POA: Insufficient documentation

## 2021-09-01 DIAGNOSIS — Z96652 Presence of left artificial knee joint: Secondary | ICD-10-CM | POA: Insufficient documentation

## 2021-09-01 DIAGNOSIS — S0990XA Unspecified injury of head, initial encounter: Secondary | ICD-10-CM | POA: Diagnosis not present

## 2021-09-01 HISTORY — DX: Unspecified sprain of right wrist, initial encounter: S63.501A

## 2021-09-01 HISTORY — DX: Unspecified fall, initial encounter: W19.XXXA

## 2021-09-01 HISTORY — DX: Syncope and collapse: R55

## 2021-09-01 HISTORY — DX: Elevation of levels of liver transaminase levels: R74.01

## 2021-09-01 HISTORY — DX: Orthostatic hypotension: I95.1

## 2021-09-01 HISTORY — DX: Elevated white blood cell count, unspecified: D72.829

## 2021-09-01 HISTORY — DX: Hypo-osmolality and hyponatremia: E87.1

## 2021-09-01 HISTORY — DX: Acidosis, unspecified: E87.20

## 2021-09-01 LAB — COMPREHENSIVE METABOLIC PANEL
ALT: 45 U/L — ABNORMAL HIGH (ref 0–44)
AST: 33 U/L (ref 15–41)
Albumin: 3.8 g/dL (ref 3.5–5.0)
Alkaline Phosphatase: 59 U/L (ref 38–126)
Anion gap: 11 (ref 5–15)
BUN: 6 mg/dL — ABNORMAL LOW (ref 8–23)
CO2: 25 mmol/L (ref 22–32)
Calcium: 8.8 mg/dL — ABNORMAL LOW (ref 8.9–10.3)
Chloride: 94 mmol/L — ABNORMAL LOW (ref 98–111)
Creatinine, Ser: 0.85 mg/dL (ref 0.44–1.00)
GFR, Estimated: >60 mL/min (ref >60)
Glucose, Bld: 167 mg/dL — ABNORMAL HIGH (ref 70–99)
Potassium: 3.6 mmol/L (ref 3.5–5.1)
Sodium: 130 mmol/L — ABNORMAL LOW (ref 135–145)
Total Bilirubin: 0.2 mg/dL — ABNORMAL LOW (ref 0.3–1.2)
Total Protein: 6.9 g/dL (ref 6.5–8.1)

## 2021-09-01 LAB — URINALYSIS, ROUTINE W REFLEX MICROSCOPIC
Bilirubin Urine: NEGATIVE
Glucose, UA: NEGATIVE mg/dL
Hgb urine dipstick: NEGATIVE
Ketones, ur: NEGATIVE mg/dL
Leukocytes,Ua: NEGATIVE
Nitrite: NEGATIVE
Protein, ur: NEGATIVE mg/dL
Specific Gravity, Urine: 1.003 — ABNORMAL LOW (ref 1.005–1.030)
pH: 6 (ref 5.0–8.0)

## 2021-09-01 LAB — LACTIC ACID, PLASMA
Lactic Acid, Venous: 2.2 mmol/L (ref 0.5–1.9)
Lactic Acid, Venous: 2.2 mmol/L (ref 0.5–1.9)
Lactic Acid, Venous: 1.4 mmol/L (ref 0.5–1.9)

## 2021-09-01 LAB — TROPONIN I (HIGH SENSITIVITY)
Troponin I (High Sensitivity): 5 ng/L (ref <18)
Troponin I (High Sensitivity): 8 ng/L (ref <18)

## 2021-09-01 LAB — CBC WITH DIFFERENTIAL/PLATELET
Abs Immature Granulocytes: 0.11 10*3/uL — ABNORMAL HIGH (ref 0.00–0.07)
Basophils Absolute: 0.1 10*3/uL (ref 0.0–0.1)
Basophils Relative: 0 %
Eosinophils Absolute: 0.1 10*3/uL (ref 0.0–0.5)
Eosinophils Relative: 1 %
HCT: 40.9 % (ref 36.0–46.0)
Hemoglobin: 14.4 g/dL (ref 12.0–15.0)
Immature Granulocytes: 1 %
Lymphocytes Relative: 14 %
Lymphs Abs: 1.8 10*3/uL (ref 0.7–4.0)
MCH: 31.1 pg (ref 26.0–34.0)
MCHC: 35.2 g/dL (ref 30.0–36.0)
MCV: 88.3 fL (ref 80.0–100.0)
Monocytes Absolute: 0.9 10*3/uL (ref 0.1–1.0)
Monocytes Relative: 7 %
Neutro Abs: 10.1 10*3/uL — ABNORMAL HIGH (ref 1.7–7.7)
Neutrophils Relative %: 77 %
Platelets: 226 10*3/uL (ref 150–400)
RBC: 4.63 MIL/uL (ref 3.87–5.11)
RDW: 12.5 % (ref 11.5–15.5)
WBC: 12.9 10*3/uL — ABNORMAL HIGH (ref 4.0–10.5)
nRBC: 0 % (ref 0.0–0.2)

## 2021-09-01 LAB — BASIC METABOLIC PANEL
Anion gap: 12 (ref 5–15)
BUN: 7 mg/dL — ABNORMAL LOW (ref 8–23)
CO2: 24 mmol/L (ref 22–32)
Calcium: 9.1 mg/dL (ref 8.9–10.3)
Chloride: 98 mmol/L (ref 98–111)
Creatinine, Ser: 0.71 mg/dL (ref 0.44–1.00)
GFR, Estimated: >60 mL/min (ref >60)
Glucose, Bld: 160 mg/dL — ABNORMAL HIGH (ref 70–99)
Potassium: 3.5 mmol/L (ref 3.5–5.1)
Sodium: 134 mmol/L — ABNORMAL LOW (ref 135–145)

## 2021-09-01 LAB — CBG MONITORING, ED: Glucose-Capillary: 170 mg/dL — ABNORMAL HIGH (ref 70–99)

## 2021-09-01 LAB — TSH: TSH: 2.192 u[IU]/mL (ref 0.350–4.500)

## 2021-09-01 LAB — CK: Total CK: 259 U/L — ABNORMAL HIGH (ref 38–234)

## 2021-09-01 MED ORDER — QUETIAPINE FUMARATE 25 MG PO TABS
12.5000 mg | ORAL_TABLET | ORAL | Status: AC
  Administered 2021-09-02: 12.5 mg via ORAL
  Filled 2021-09-01: qty 1

## 2021-09-01 MED ORDER — DONEPEZIL HCL 10 MG PO TABS
10.0000 mg | ORAL_TABLET | Freq: Every day | ORAL | Status: DC
  Administered 2021-09-01: 10 mg via ORAL
  Filled 2021-09-01: qty 1

## 2021-09-01 MED ORDER — MELATONIN 5 MG PO TABS
5.0000 mg | ORAL_TABLET | Freq: Every evening | ORAL | Status: DC | PRN
Start: 2021-09-01 — End: 2021-09-02

## 2021-09-01 MED ORDER — SERTRALINE HCL 50 MG PO TABS
50.0000 mg | ORAL_TABLET | Freq: Every day | ORAL | Status: DC
  Administered 2021-09-01: 50 mg via ORAL
  Filled 2021-09-01: qty 1

## 2021-09-01 MED ORDER — SODIUM CHLORIDE 0.9% FLUSH
3.0000 mL | Freq: Two times a day (BID) | INTRAVENOUS | Status: DC
  Administered 2021-09-02: 3 mL via INTRAVENOUS

## 2021-09-01 MED ORDER — ACETAMINOPHEN 325 MG PO TABS
650.0000 mg | ORAL_TABLET | Freq: Four times a day (QID) | ORAL | Status: DC | PRN
  Administered 2021-09-01: 650 mg via ORAL
  Filled 2021-09-01: qty 2

## 2021-09-01 MED ORDER — ACETAMINOPHEN 325 MG PO TABS
650.0000 mg | ORAL_TABLET | Freq: Two times a day (BID) | ORAL | Status: DC
  Administered 2021-09-01 – 2021-09-02 (×2): 650 mg via ORAL
  Filled 2021-09-01 (×2): qty 2

## 2021-09-01 MED ORDER — ONDANSETRON HCL 4 MG/2ML IJ SOLN: 4.0000 mg | Freq: Four times a day (QID) | INTRAMUSCULAR | Status: DC | PRN

## 2021-09-01 MED ORDER — ONDANSETRON HCL 4 MG PO TABS: 4.0000 mg | ORAL_TABLET | Freq: Four times a day (QID) | ORAL | Status: DC | PRN

## 2021-09-01 MED ORDER — MELATONIN 3 MG PO TABS
1.5000 mg | ORAL_TABLET | Freq: Every day | ORAL | Status: DC
  Administered 2021-09-01: 1.5 mg via ORAL
  Filled 2021-09-01: qty 1

## 2021-09-01 MED ORDER — LACTATED RINGERS IV BOLUS
1000.0000 mL | Freq: Once | INTRAVENOUS | Status: AC
  Administered 2021-09-01: 1000 mL via INTRAVENOUS

## 2021-09-01 MED ORDER — ENOXAPARIN SODIUM 40 MG/0.4ML IJ SOSY
40.0000 mg | PREFILLED_SYRINGE | Freq: Every day | INTRAMUSCULAR | Status: DC
Start: 2021-09-01 — End: 2021-09-02
  Administered 2021-09-01 – 2021-09-02 (×2): 40 mg via SUBCUTANEOUS
  Filled 2021-09-01 (×2): qty 0.4

## 2021-09-01 MED ORDER — LACTATED RINGERS IV SOLN: INTRAVENOUS | Status: AC

## 2021-09-01 MED ORDER — ALBUTEROL SULFATE (2.5 MG/3ML) 0.083% IN NEBU: 2.5000 mg | INHALATION_SOLUTION | Freq: Four times a day (QID) | RESPIRATORY_TRACT | Status: DC | PRN

## 2021-09-01 NOTE — ED Provider Notes (Signed)
  Physical Exam  BP (!) 143/58   Pulse 70   Temp 98.4 F (36.9 C)   Resp 19   SpO2 95%   Physical Exam Vitals and nursing note reviewed.  Constitutional:      General: She is not in acute distress.    Appearance: She is well-developed.  HENT:     Head: Normocephalic and atraumatic.  Eyes:     Conjunctiva/sclera: Conjunctivae normal.  Cardiovascular:     Rate and Rhythm: Normal rate and regular rhythm.     Heart sounds: No murmur heard. Pulmonary:     Effort: Pulmonary effort is normal. No respiratory distress.     Breath sounds: Normal breath sounds.  Abdominal:     Palpations: Abdomen is soft.     Tenderness: There is no abdominal tenderness.  Musculoskeletal:        General: No swelling.     Cervical back: Neck supple.  Skin:    General: Skin is warm and dry.     Capillary Refill: Capillary refill takes less than 2 seconds.  Neurological:     Mental Status: She is alert.  Psychiatric:        Mood and Affect: Mood normal.     Procedures  .Critical Care  Performed by: Teressa Lower, MD Authorized by: Teressa Lower, MD   Critical care provider statement:    Critical care time (minutes):  30   Critical care was necessary to treat or prevent imminent or life-threatening deterioration of the following conditions:  Dehydration   Critical care was time spent personally by me on the following activities:  Development of treatment plan with patient or surrogate, discussions with consultants, evaluation of patient's response to treatment, examination of patient, ordering and review of laboratory studies, ordering and review of radiographic studies, ordering and performing treatments and interventions, pulse oximetry, re-evaluation of patient's condition and review of old charts   ED Course / MDM   Clinical Course as of 09/01/21 0935  Tue Sep 01, 2021  0701 Full workup, dc, passed out in parking lot with diaphoresis and possible seizure activity  [MK]    Clinical  Course User Index [MK] Ashawnti Tangen, Debe Coder, MD   Medical Decision Making Amount and/or Complexity of Data Reviewed Labs: ordered.     Patient received in handoff.  Initially presenting with a fall and negative work-up.  Previous provider attempted to discharge the patient but she had an episode of significant orthostatic syncope when attempting to get up to go to the bathroom prior to discharge.  Patient was diaphoretic but quickly returned normal mental status baseline with no postictal period.  Leukocytosis to 12.9, lactic acid 2.2, CK 259.  Orthostatic vital signs are positive.  Patient will require admission for rehydration and syncope work-up.  I have overall lower suspicion for seizure given no postictal period with onset of this particular episode with clear positional nature.       Teressa Lower, MD 09/01/21 [phone]

## 2021-09-01 NOTE — ED Provider Notes (Signed)
As patient was being discharged, she evidently had a syncopal episode and was noted to have stertorous respirations and was unresponsive and was drooling, question seizure activity.  She was very diaphoretic.  She was probably returned to the room where she is now awake and alert and with normal vital signs.  I will order repeat electrolytes as well as CK and lactic acid to see if there is evidence of seizure activity.  I will also order orthostatic vital signs to see if her blood pressure drops to unsafe levels when she is upright.  I have reviewed and interpreted her ECG and my interpretation is first-degree AV block, early transition, no ST or T changes, unchanged from prior.  New labs are still pending.  Case is signed out to Dr. Tinnie Gens.   EKG Interpretation  Date/Time:  Tuesday September 01 2021 05:33:22 EDT Ventricular Rate:  61 PR Interval:  233 QRS Duration: 78 QT Interval:  418 QTC Calculation: 421 R Axis:   36 Text Interpretation: Sinus rhythm Prolonged PR interval Abnormal R-wave progression, early transition When compared with ECG of 11/28/2021, No significant change was found Confirmed by Delora Fuel ([id number]) on 09/01/2021 0:31:59 AM          Delora Fuel, MD 45/85/92 [phone]

## 2021-09-01 NOTE — ED Triage Notes (Signed)
Upon discharge pt was being wheeled out to car when pt loc fell forward made loud snoring sounds was unresponsive. NT sternal rubbed pt made snoring sound again and began drooling. Pt was very diaphoretic during and after event.

## 2021-09-01 NOTE — ED Notes (Signed)
Pt attempting to get out of bed at this time. States that she needs her purse to make a phone call. Pt advised that her phone was in her lap and that she didn't need her purse that she needed to lay down and sleep

## 2021-09-01 NOTE — ED Notes (Signed)
Pt is asking everyone that walks by to give her her purse and her cane. When pt previously had her cane she was using it to try and move things around the room further away from her

## 2021-09-01 NOTE — Evaluation (Signed)
Physical Therapy Evaluation Patient Details Name: Jodi Summers MRN: [id number] DOB: 1940/03/06 Today's Date: 09/01/2021  History of Present Illness  Pt is a 81 y/o female admitted following fall and seizure like activity. Pt was getting ready to d/c and had syncopal type episode and was re-admitted. PMH includes dementia.  Clinical Impression  Pt admitted secondary to problem above with deficits below. Pt requiring min guard A to stand and take side steps at EOB. Pt reporting increased fatigue and mild dizziness which limited mobility tolerance. Did not drop in BP: see below. Pt currently at ALF and daughter reports ALF staff can provide increased supervision initially. Feel pt would benefit from PT follow up at ALF. Will continue to follow acutely.    09/01/21 1135  Orthostatic Lying   BP- Lying 135/61  Pulse- Lying 69  Orthostatic Sitting  BP- Sitting 128/54  Pulse- Sitting 85  Orthostatic Standing at 0 minutes  BP- Standing at 0 minutes 118/51  Pulse- Standing at 0 minutes 86         Recommendations for follow up therapy are one component of a multi-disciplinary discharge planning process, led by the attending physician.  Recommendations may be updated based on patient status, additional functional criteria and insurance authorization.  Follow Up Recommendations Home health PT      Assistance Recommended at Discharge Frequent or constant Supervision/Assistance  Patient can return home with the following  A little help with walking and/or transfers;A little help with bathing/dressing/bathroom;Direct supervision/assist for medications management;Assistance with cooking/housework;Assist for transportation    Equipment Recommendations None recommended by PT  Recommendations for Other Services       Functional Status Assessment Patient has had a recent decline in their functional status and demonstrates the ability to make significant improvements in function in a reasonable and  predictable amount of time.     Precautions / Restrictions Precautions Precautions: Fall Required Braces or Orthoses: Other Brace Other Brace: given wrist brace for R wrist Restrictions Weight Bearing Restrictions: No      Mobility  Bed Mobility Overal bed mobility: Needs Assistance Bed Mobility: Supine to Sit, Sit to Supine     Supine to sit: Min guard Sit to supine: Min guard   General bed mobility comments: Min guard for safety. Using bed rail for assist.    Transfers Overall transfer level: Needs assistance Equipment used: None Transfers: Sit to/from Stand Sit to Stand: Min guard           General transfer comment: min guard for safety    Ambulation/Gait Ambulation/Gait assistance: Min guard   Assistive device: None         General Gait Details: Took steps at EOB with min guard A. Pt fatigued and mildly dizzy, so further mobility deferred.  Stairs            Wheelchair Mobility    Modified Rankin (Stroke Patients Only)       Balance Overall balance assessment: Needs assistance Sitting-balance support: No upper extremity supported, Feet supported Sitting balance-Leahy Scale: Fair     Standing balance support: No upper extremity supported Standing balance-Leahy Scale: Fair                               Pertinent Vitals/Pain Pain Assessment Pain Assessment: Faces Faces Pain Scale: Hurts even more Pain Location: R wrist Pain Descriptors / Indicators: Grimacing, Guarding Pain Intervention(s): Limited activity within patient's tolerance, Monitored during session, Repositioned  Home Living Family/patient expects to be discharged to:: Assisted living                 Home Equipment: Kasandra Knudsen - single point;Shower seat - built in Additional Comments: [facility]    Prior Function Prior Level of Function : Needs assist             Mobility Comments: Has cane if she needs it ADLs Comments: Independent with bathing  dressing; assist with med management from nursing staff     Hand Dominance        Extremity/Trunk Assessment   Upper Extremity Assessment Upper Extremity Assessment: Defer to OT evaluation (R wrist swelling and bruising)    Lower Extremity Assessment Lower Extremity Assessment: Generalized weakness    Cervical / Trunk Assessment Cervical / Trunk Assessment: Kyphotic  Communication   Communication: HOH  Cognition Arousal/Alertness: Awake/alert Behavior During Therapy: WFL for tasks assessed/performed Overall Cognitive Status: History of cognitive impairments - at baseline                                 General Comments: Dementia at baseline        General Comments General comments (skin integrity, edema, etc.): Pt's daughter present throughout.    Exercises     Assessment/Plan    PT Assessment Patient needs continued PT services  PT Problem List Decreased strength;Decreased activity tolerance;Decreased balance;Decreased mobility;Decreased knowledge of use of DME;Decreased knowledge of precautions       PT Treatment Interventions DME instruction;Gait training;Functional mobility training;Stair training;Therapeutic activities;Therapeutic exercise;Balance training;Patient/family education    PT Goals (Current goals can be found in the Care Plan section)  Acute Rehab PT Goals Patient Stated Goal: to go back to ALF PT Goal Formulation: With patient/family Time For Goal Achievement: 09/15/21 Potential to Achieve Goals: Good    Frequency Min 3X/week     Co-evaluation               AM-PAC PT "6 Clicks" Mobility  Outcome Measure Help needed turning from your back to your side while in a flat bed without using bedrails?: None Help needed moving from lying on your back to sitting on the side of a flat bed without using bedrails?: A Little Help needed moving to and from a bed to a chair (including a wheelchair)?: A Little Help needed standing up  from a chair using your arms (e.g., wheelchair or bedside chair)?: A Little Help needed to walk in hospital room?: A Little Help needed climbing 3-5 steps with a railing? : A Lot 6 Click Score: 18    End of Session Equipment Utilized During Treatment: Gait belt Activity Tolerance: Patient limited by fatigue;Treatment limited secondary to medical complications (Comment) (drop in BP) Patient left: in bed;with call bell/phone within reach;with family/visitor present (on stretcher in ED) Nurse Communication: Mobility status PT Visit Diagnosis: Unsteadiness on feet (R26.81);Muscle weakness (generalized) (M62.81);History of falling (Z91.81)    Time: 1130-1147 PT Time Calculation (min) (ACUTE ONLY): 17 min   Charges:   PT Evaluation $PT Eval Moderate Complexity: 1 Mod          Reuel Derby, PT, DPT  Acute Rehabilitation Services  Office: [phone]   Rudean Hitt 09/01/2021, 1:35 PM

## 2021-09-01 NOTE — Discharge Instructions (Addendum)
Please follow-up with the neurologist for consideration for outpatient EEG.  Wear the wrist brace as needed.  Your blood sugar was high today.  Please have your primary care provider recheck it.  If it stays elevated, you may need to be on medication for it.

## 2021-09-01 NOTE — ED Notes (Signed)
Received verbal report from Jordan M RN at this time 

## 2021-09-01 NOTE — ED Notes (Addendum)
Posey alarm placed, door is opened, pt educated to stay in the bed. Pt currently playing with her phone

## 2021-09-01 NOTE — ED Notes (Signed)
Pt trying again to get out of bed stating she needs her purse. Provided pt her purse at this time

## 2021-09-01 NOTE — ED Notes (Signed)
Pt attempting to get out of bed again stating that she needs to call her son -in-law. Advised her what time it was and that it was too late to make phone calls. Difficult time redirecting her back into bed. Called daughter to see if someone was able to come stay with her for the night

## 2021-09-01 NOTE — Progress Notes (Signed)
Orthopedic Tech Progress Note Patient Details:  Jodi Summers [birth year] [id number]  Ortho Devices Type of Ortho Device: Velcro wrist splint Ortho Device/Splint Location: rue Ortho Device/Splint Interventions: Ordered, Application   Post Interventions Patient Tolerated: Well Instructions Provided: Care of device  [name] 09/01/2021, 4:27 AM

## 2021-09-01 NOTE — ED Notes (Signed)
Lab at bedside

## 2021-09-01 NOTE — ED Notes (Signed)
Found pt out of bed walking around the room. She removed monitor cables and almost pulled her PIV out. Redirected pt back in bed, placed her back on the monitor, educated her on the importance of staying in bed and using call bell. Called and spoke with daughter about situation and per daughter she is going to come back and spend the night

## 2021-09-01 NOTE — ED Provider Notes (Signed)
X-rays and CT scans showed no acute injury.  I have independently viewed the images, and agree with radiologist's interpretation.  I have reviewed and interpreted the laboratory tests and my interpretation is elevated random glucose, mild hyponatremia which is not clinically significant, mild leukocytosis which is nonspecific, normal hemoglobin, normal urinalysis.  Patient is advised that there is not an indication for anticonvulsant treatment today, but she is referred to neurology for further evaluation.  She is given a wrist brace to use as needed.  Also told to follow-up with primary care provider regarding elevated glucose.  Results for orders placed or performed during the hospital encounter of 08/31/21  Comprehensive metabolic panel  Result Value Ref Range   Sodium 130 (L) 135 - 145 mmol/L   Potassium 3.6 3.5 - 5.1 mmol/L   Chloride 94 (L) 98 - 111 mmol/L   CO2 25 22 - 32 mmol/L   Glucose, Bld 167 (H) 70 - 99 mg/dL   BUN 6 (L) 8 - 23 mg/dL   Creatinine, Ser 0.85 0.44 - 1.00 mg/dL   Calcium 8.8 (L) 8.9 - 10.3 mg/dL   Total Protein 6.9 6.5 - 8.1 g/dL   Albumin 3.8 3.5 - 5.0 g/dL   AST 33 15 - 41 U/L   ALT 45 (H) 0 - 44 U/L   Alkaline Phosphatase 59 38 - 126 U/L   Total Bilirubin 0.2 (L) 0.3 - 1.2 mg/dL   GFR, Estimated >60 >60 mL/min   Anion gap 11 5 - 15  CBC with Differential  Result Value Ref Range   WBC 12.6 (H) 4.0 - 10.5 K/uL   RBC 4.08 3.87 - 5.11 MIL/uL   Hemoglobin 12.5 12.0 - 15.0 g/dL   HCT 36.8 36.0 - 46.0 %   MCV 90.2 80.0 - 100.0 fL   MCH 30.6 26.0 - 34.0 pg   MCHC 34.0 30.0 - 36.0 g/dL   RDW 12.4 11.5 - 15.5 %   Platelets 238 150 - 400 K/uL   nRBC 0.0 0.0 - 0.2 %   Neutrophils Relative % 78 %   Neutro Abs 10.0 (H) 1.7 - 7.7 K/uL   Lymphocytes Relative 12 %   Lymphs Abs 1.5 0.7 - 4.0 K/uL   Monocytes Relative 7 %   Monocytes Absolute 0.9 0.1 - 1.0 K/uL   Eosinophils Relative 1 %   Eosinophils Absolute 0.1 0.0 - 0.5 K/uL   Basophils Relative 1 %   Basophils  Absolute 0.1 0.0 - 0.1 K/uL   Immature Granulocytes 1 %   Abs Immature Granulocytes 0.08 (H) 0.00 - 0.07 K/uL  Urinalysis, Routine w reflex microscopic Urine, Clean Catch  Result Value Ref Range   Color, Urine STRAW (A) YELLOW   APPearance CLEAR CLEAR   Specific Gravity, Urine 1.003 (L) 1.005 - 1.030   pH 6.0 5.0 - 8.0   Glucose, UA NEGATIVE NEGATIVE mg/dL   Hgb urine dipstick NEGATIVE NEGATIVE   Bilirubin Urine NEGATIVE NEGATIVE   Ketones, ur NEGATIVE NEGATIVE mg/dL   Protein, ur NEGATIVE NEGATIVE mg/dL   Nitrite NEGATIVE NEGATIVE   Leukocytes,Ua NEGATIVE NEGATIVE  TSH  Result Value Ref Range   TSH 2.192 0.350 - 4.500 uIU/mL  CBG monitoring, ED  Result Value Ref Range   Glucose-Capillary 161 (H) 70 - 99 mg/dL  Troponin I (High Sensitivity)  Result Value Ref Range   Troponin I (High Sensitivity) 5 <18 ng/L  Troponin I (High Sensitivity)  Result Value Ref Range   Troponin I (High Sensitivity) 8 <  18 ng/L   CT Head Wo Contrast  Result Date: 09/01/2021 CLINICAL DATA:  Trauma. EXAM: CT HEAD WITHOUT CONTRAST CT CERVICAL SPINE WITHOUT CONTRAST TECHNIQUE: Multidetector CT imaging of the head and cervical spine was performed following the standard protocol without intravenous contrast. Multiplanar CT image reconstructions of the cervical spine were also generated. RADIATION DOSE REDUCTION: This exam was performed according to the departmental dose-optimization program which includes automated exposure control, adjustment of the mA and/or kV according to patient size and/or use of iterative reconstruction technique. COMPARISON:  Brain MRI dated 03/24/2021. FINDINGS: CT HEAD FINDINGS Brain: Mild age-related atrophy and chronic microvascular ischemic changes. There is no acute intracranial hemorrhage. No mass effect or midline shift. No extra-axial fluid collection. Vascular: No hyperdense vessel or unexpected calcification. Skull: Normal. Negative for fracture or focal lesion. Sinuses/Orbits: No  acute finding. Other: None CT CERVICAL SPINE FINDINGS Alignment: No acute subluxation. Skull base and vertebrae: No acute fracture. Soft tissues and spinal canal: No prevertebral fluid or swelling. No visible canal hematoma. Disc levels:  No acute findings.  No degenerative changes. Upper chest: Negative. Other: Bilateral carotid bulb calcified plaques. IMPRESSION: 1. No acute intracranial pathology. Mild age-related atrophy and chronic microvascular ischemic changes. 2. No acute cervical spine fracture or subluxation. Electronically Signed   By: Anner Crete M.D.   On: 09/01/2021 02:26   CT Cervical Spine Wo Contrast  Result Date: 09/01/2021 CLINICAL DATA:  Trauma. EXAM: CT HEAD WITHOUT CONTRAST CT CERVICAL SPINE WITHOUT CONTRAST TECHNIQUE: Multidetector CT imaging of the head and cervical spine was performed following the standard protocol without intravenous contrast. Multiplanar CT image reconstructions of the cervical spine were also generated. RADIATION DOSE REDUCTION: This exam was performed according to the departmental dose-optimization program which includes automated exposure control, adjustment of the mA and/or kV according to patient size and/or use of iterative reconstruction technique. COMPARISON:  Brain MRI dated 03/24/2021. FINDINGS: CT HEAD FINDINGS Brain: Mild age-related atrophy and chronic microvascular ischemic changes. There is no acute intracranial hemorrhage. No mass effect or midline shift. No extra-axial fluid collection. Vascular: No hyperdense vessel or unexpected calcification. Skull: Normal. Negative for fracture or focal lesion. Sinuses/Orbits: No acute finding. Other: None CT CERVICAL SPINE FINDINGS Alignment: No acute subluxation. Skull base and vertebrae: No acute fracture. Soft tissues and spinal canal: No prevertebral fluid or swelling. No visible canal hematoma. Disc levels:  No acute findings.  No degenerative changes. Upper chest: Negative. Other: Bilateral carotid  bulb calcified plaques. IMPRESSION: 1. No acute intracranial pathology. Mild age-related atrophy and chronic microvascular ischemic changes. 2. No acute cervical spine fracture or subluxation. Electronically Signed   By: Anner Crete M.D.   On: 09/01/2021 02:26   DG Wrist Complete Right  Result Date: 08/31/2021 CLINICAL DATA:  Wrist pain EXAM: RIGHT WRIST - COMPLETE 3+ VIEW COMPARISON:  None Available. FINDINGS: No fracture or malalignment. Chondrocalcinosis. Mild radiocarpal degenerative change. Moderate arthritis at the first [facility] joint and STT interval. IMPRESSION: Moderate wrist arthritis.  Chondrocalcinosis Electronically Signed   By: Donavan Foil M.D.   On: 08/31/2021 23:58   DG Hand 2 View Right  Result Date: 08/31/2021 CLINICAL DATA:  Hand pain EXAM: RIGHT HAND - 2 VIEW COMPARISON:  None Available. FINDINGS: No fracture or malalignment. Joint space narrowing and degenerative change at the first IP joint, the second through fifth D IP and PIP joints, and the second and third MCP joints. Moderate arthritis at the first [facility] joint and STT interval. Mild joint space narrowing at  the radiocarpal interval with cartilaginous calcifications. IMPRESSION: 1. No acute osseous abnormality 2. 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 Electronically Signed   By: Donavan Foil M.D.   On: 08/31/2021 23:57   DG Chest Portable 1 View  Result Date: 08/31/2021 CLINICAL DATA:  Syncope EXAM: PORTABLE CHEST 1 VIEW COMPARISON:  08/30/2014 FINDINGS: The heart size and mediastinal contours are within normal limits. Aortic atherosclerosis. Both lungs are clear. The visualized skeletal structures are unremarkable. IMPRESSION: No active disease. Electronically Signed   By: Donavan Foil M.D.   On: 51/98/2429 98:06       Delora Fuel, MD 99/96/72 [phone]

## 2021-09-01 NOTE — H&P (Signed)
History and Physical    Patient: Jodi Summers DOB: 07/01/1940 DOA: 09/01/2021 DOS: the patient was seen and examined on 09/01/2021 PCP: [name], MD  Patient coming from: [facility] via EMS  Chief Complaint:  Chief Complaint  Patient presents with   Loss of Consciousness   HPI: Jodi Summers is a 81 y.o. female with medical history significant of hyperlipidemia and dementia initially presented after having a fall from [facility] assisted [facility] yesterday.  History is obtained from review of prior records and talks with her daughter as the patient has dementia.  Patient had reported feeling hot prior to falling and after hitting the ground was reported to have some seizure-like activity.  She is not on any blood thinners and did not have any prior history of seizures.  She complained of some right wrist pain after the fall.  X-ray imaging of her right wrist, pelvis, and chest showed no acute abnormality there was no reports of any postictal phase and she was back to her baseline when they tried to discharge her.  As they are trying to get her discharged back to her assisted living facility she went to use the bathroom and had fallen.  Apparently the patient previously had been in independent living up until approximately 2 months ago when she was moving to assisted living as they noticed that she was becoming more forgetful.  To her family's knowledge they were not made aware that the patient had any change in her eating habits.  On admission into the emergency department patient was noted to be afebrile with positive orthostatic vital signs.  Blood pressure 136/66 sitting with heart rate 82 to blood pressure 99/80 standing with heart rate 59.  Labs since 7/17 revealed WBC 12.6->12.9, sodium 130->134, BUN 7, creatinine 0.7, glucose 170, ALT 45, TSH 2.192, and lactic acid 2.2.  CT imaging of the head and cervical spine showed no acute abnormality.  Chest x-ray did not note any  acute abnormalities.  Urinalysis showed no signs of infection.  She had been ordered 1 L of normal saline IV fluids.  Review of Systems: As mentioned in the history of present illness. All other systems reviewed and are negative. 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   Hyperlipidemia 11/30/2018   Knee pain 28/78/6767   Lichen sclerosus 20/94/7096   Low back pain 02/22/2017   Malignant neoplasm of upper-outer quadrant of right female breast 1999   Pain in toe 12/06/2019   Personal history of chemotherapy    Personal history of radiation therapy    Unilateral primary osteoarthritis, 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  She has never used smokeless tobacco. She reports that she does not drink alcohol and does not use drugs.  No Known Allergies  Family History  Problem Relation Age of Onset   Alzheimer's disease Mother    Alzheimer's disease Sister     Prior to Admission medications   Medication Sig Start Date End Date Taking? Authorizing Provider  clobetasol ointment (TEMOVATE) 6.23 % Apply 1 application topically 2 (two) times daily. 03/17/21   Megan Salon, MD  donepezil  (ARICEPT) 10 MG tablet Take 1 tablet (10 mg total) by mouth at bedtime. 06/30/21   Rondel Jumbo, PA-C  sertraline (ZOLOFT) 25 MG tablet Take 1 tablet (25 mg total) by mouth at bedtime. 25 mg qhs x 2 weeks then increase to 50 mg 07/20/21   Royal Hawthorn, NP    Physical Exam: Vitals:   09/01/21 0820 09/01/21 0821 09/01/21 0824 09/01/21 0905  BP: 99/80 (!) 106/54 (!) 150/65 (!) 143/58  Pulse: 74 90 71 70  Resp:  19 (!) 23 19  Temp:      SpO2: 96% 97% 99% 95%    Constitutional: Elderly female currently in no acute distress Eyes: PERRL, lids and conjunctivae normal ENMT: Mucous membranes are moist.  Patient suffered a laceration of her inside her lip not actively bleeding. Neck: normal, supple, no JVD appreciated Respiratory: clear to auscultation bilaterally, no wheezing, no crackles. Normal respiratory effort.  Patient able to talk in complete sentences Cardiovascular: Regular rate and rhythm, no murmurs / rubs / gallops. No extremity edema. 2+ pedal pulses. No carotid bruits.  Abdomen: no tenderness, no masses palpated. Bowel sounds positive.  Musculoskeletal: no clubbing / cyanosis. No joint deformity upper and lower extremities.  Right wrist currently wrapped in Ace bandage. Skin: Tenting present signifying poor skin turgor.  Mild bruising noted of the forehead  Neurologic: CN 2-12 grossly intact.  Able to move all extremities without any issues. Psychiatric: Normal judgment and insight.  Alert and oriented to person.  Data Reviewed:   Sinus rhythm at 61 bpm with first-degree heart block.  Reviewed labs, imaging, and pertinent records Assessment and Plan: Fall syncope and collapse secondary to orthostatic hypotension Patient was noted to have fallen yesterday which caused her to come to the emergency department in the first place.  Reported feeling hot prior to falling and was reported to have seizure-like activity.  Prior to being discharged patient passed out while getting up to  go use the bathroom.  Orthostatics were checked and noted to be significantly positive.  Skin turgor noted to be poor on physical exam with tenting present.  Possible convulsive syncope which led to the first fall secondary to dehydration and/or poor p.o. intake in the setting of worsening dementia.  We will -Admit to a telemetry bed -Recheck orthostatic vital signs in a.m. -Continue lactated Ringer's at 75 mL/h -PT/OT to eval and treat -Transitions of care consulted  Lactic acidosis leukocytosis Patient was noted to have WBC 12.6-> 12.9 and initial lactic acid 2.2.  No clear source of infection appreciated at this time.  Patient was noted to be afebrile and thought symptoms may be related to syncope and dehydration.  Question of lactic acidosis related to patient being on metformin. -Trend lactic acid levels -Monitor off of antibiotics at this time and orders placed to check blood cultures if patient spikes fever  Hyponatremia Acute.  Sodium 130-> 134 after receiving some IV fluids.  Suspect hypovolemic hyponatremia. -Continue IV fluids -Recheck sodium levels in  a.m.  Right wrist sprain X-ray imaging showed no acute abnormality of the right wrist.  She had a brace on initially, but due to discomfort was wrapped with Ace bandage. -Tylenol 650 mg scheduled twice daily and as needed -Cool compresses as needed  Controlled diabetes mellitus type 2 without long-term use of insulin On admission glucose initially noted to be 160.  Last hemoglobin A1c was 7 on 04/30/2021.  Home medication regimen includes metformin 500 mg twice daily. -Held metformin -Heart healthy carb modified diet  Dementia Family had moved patient from independent living to assisted living within the last 2 months due to worsening issues with her memory.  Patient alert and oriented only to self, but is able to recognize family members. -Delirium precautions -Set bed alarm on -Continue Aricept  Elevated ALT Chronic.  ALT  45, but had been elevated since lab work done in 2022.  DVT prophylaxis: Lovenox Advance Care Planning:   Code Status: Full Code   Consults: None  Family Communication: Daughter and grandson updated at bedside  Severity of Illness: The appropriate patient status for this patient is OBSERVATION. Observation status is judged to be reasonable and necessary in order to provide the required intensity of service to ensure the patient's safety. The patient's presenting symptoms, physical exam findings, and initial radiographic and laboratory data in the context of their medical condition is felt to place them at decreased risk for further clinical deterioration. Furthermore, it is anticipated that the patient will be medically stable for discharge from the hospital within 2 midnights of admission.   Author: Norval Morton, MD 09/01/2021 9:58 AM  For on call review [email or web address].

## 2021-09-02 ENCOUNTER — Encounter: Payer: Medicare PPO | Admitting: Internal Medicine

## 2021-09-02 DIAGNOSIS — W19XXXD Unspecified fall, subsequent encounter: Secondary | ICD-10-CM | POA: Diagnosis not present

## 2021-09-02 DIAGNOSIS — F0390 Unspecified dementia without behavioral disturbance: Secondary | ICD-10-CM | POA: Diagnosis not present

## 2021-09-02 DIAGNOSIS — S63501A Unspecified sprain of right wrist, initial encounter: Secondary | ICD-10-CM | POA: Diagnosis not present

## 2021-09-02 DIAGNOSIS — Z7401 Bed confinement status: Secondary | ICD-10-CM | POA: Diagnosis not present

## 2021-09-02 DIAGNOSIS — E118 Type 2 diabetes mellitus with unspecified complications: Secondary | ICD-10-CM | POA: Diagnosis not present

## 2021-09-02 DIAGNOSIS — R7401 Elevation of levels of liver transaminase levels: Secondary | ICD-10-CM | POA: Diagnosis not present

## 2021-09-02 DIAGNOSIS — E871 Hypo-osmolality and hyponatremia: Secondary | ICD-10-CM | POA: Diagnosis not present

## 2021-09-02 DIAGNOSIS — I951 Orthostatic hypotension: Secondary | ICD-10-CM | POA: Diagnosis not present

## 2021-09-02 DIAGNOSIS — R55 Syncope and collapse: Secondary | ICD-10-CM | POA: Diagnosis not present

## 2021-09-02 LAB — CBC
HCT: 34.1 % — ABNORMAL LOW (ref 36.0–46.0)
Hemoglobin: 11.9 g/dL — ABNORMAL LOW (ref 12.0–15.0)
MCH: 30.9 pg (ref 26.0–34.0)
MCHC: 34.9 g/dL (ref 30.0–36.0)
MCV: 88.6 fL (ref 80.0–100.0)
Platelets: 194 10*3/uL (ref 150–400)
RBC: 3.85 MIL/uL — ABNORMAL LOW (ref 3.87–5.11)
RDW: 12.5 % (ref 11.5–15.5)
WBC: 8.5 10*3/uL (ref 4.0–10.5)
nRBC: 0 % (ref 0.0–0.2)

## 2021-09-02 LAB — COMPREHENSIVE METABOLIC PANEL
ALT: 35 U/L (ref 0–44)
AST: 25 U/L (ref 15–41)
Albumin: 3.5 g/dL (ref 3.5–5.0)
Alkaline Phosphatase: 55 U/L (ref 38–126)
Anion gap: 9 (ref 5–15)
BUN: 5 mg/dL — ABNORMAL LOW (ref 8–23)
CO2: 25 mmol/L (ref 22–32)
Calcium: 8.9 mg/dL (ref 8.9–10.3)
Chloride: 98 mmol/L (ref 98–111)
Creatinine, Ser: 0.63 mg/dL (ref 0.44–1.00)
GFR, Estimated: >60 mL/min (ref >60)
Glucose, Bld: 118 mg/dL — ABNORMAL HIGH (ref 70–99)
Potassium: 3.4 mmol/L — ABNORMAL LOW (ref 3.5–5.1)
Sodium: 132 mmol/L — ABNORMAL LOW (ref 135–145)
Total Bilirubin: 0.9 mg/dL (ref 0.3–1.2)
Total Protein: 6.2 g/dL — ABNORMAL LOW (ref 6.5–8.1)

## 2021-09-02 NOTE — ED Notes (Signed)
Pt family sent a private sitter at bedside this time.

## 2021-09-02 NOTE — ED Notes (Signed)
Daughter is now at bedside with pt at this time

## 2021-09-02 NOTE — Progress Notes (Signed)
TOC CSW reported to RN pt return information.  Call Report #:  [phone] or Santiago Glad @         [phone]  Room #:  [place], MSW, LCSW-A Pronouns:  She/Her/Hers [facility] HealthTransitions of Care Clinical Social Worker Direct Number:  [phone] [email or web address]

## 2021-09-02 NOTE — ED Notes (Signed)
Case management is involved at this time as [facility] has called RN twice at this time regarding transfer back to facility today. Pt is admitted and RN and unsure which floor has pt with regards to inpatient SW. Pt daughter is wanting pt to go back to facility today. Facility needs paperwork done before taking pt. Pending discharge and transfer at this time.

## 2021-09-02 NOTE — Care Management Obs Status (Signed)
[place] NOTIFICATION   Patient Details  Name: Jodi Summers MRN: [id number] Date of Birth: [birth year]   Medicare Observation Status Notification Given:  Ernesta Amble, RN 09/02/2021, 4:17 PM

## 2021-09-02 NOTE — Care Management (Signed)
[facility] called to transport patient back to [facility].

## 2021-09-02 NOTE — Discharge Summary (Signed)
Physician Discharge Summary  Jodi Summers RFF:[id number] DOB: [birth year] DOA: 09/01/2021  PCP: Jodi Dad, MD  Admit date: 09/01/2021 Discharge date: 09/02/2021  Admitted From: ALF Disposition: [facility]  Recommendations for Outpatient Follow-up:  Follow up with PCP in 1-2 weeks Please obtain BMP/CBC in one week your next doctors visit.  Advised to keep herself orally hydrated.  Avoid sudden change in position  Discharge Condition: Stable CODE STATUS: Full code Diet recommendation: Regular  Brief/Interim Summary: 81 year old female with history of HLD, dementia sent over from [facility] assisted [facility] for evaluation of fall and concern.  She was evaluated in the ED including multiple radiologic imaging, no evidence of trauma.  As she was being discharged from the ED patient became orthostatic and dizzy. Upon admission CT of the head and cervical spine were unremarkable.  She was admitted to the hospital.  Overnight she received IV fluids and next morning her orthostasis improved.  She does appear to have some sort of chronic autonomic dysfunction therefore advised to keep yourself orally hydrated and avoid any sudden change in positions.  Physical therapy recommended home health.  Patient's daughter had already made all the arrangements with patient's ALF and I had an extensive discussion with her.  They also have a private caregiver who will be helping her.   Discharge Diagnoses:  Principal Problem:   Fall Active Problems:   Orthostatic hypotension   Syncope and collapse   Lactic acidosis   Leukocytosis   Hyponatremia   Right wrist sprain   Controlled type 2 diabetes mellitus with complication, without long-term current use of insulin (HCC)   Dementia without behavioral disturbance (HCC)   Elevated ALT measurement      Consultations: None  Subjective: Feels well no complaints.   Discharge Exam: Vitals:   09/02/21 1200 09/02/21 1300  BP: (!) 177/139 (!)  169/76  Pulse: 69 73  Resp: 17 17  Temp: 98 F (36.7 C)   SpO2: 96% 96%   Vitals:   09/02/21 1015 09/02/21 1100 09/02/21 1200 09/02/21 1300  BP: 139/73 (!) 170/68 (!) 177/139 (!) 169/76  Pulse: 80 80 69 73  Resp: '16 15 17 17  '$ Temp:   98 F (36.7 C)   TempSrc:      SpO2: 96% 95% 96% 96%  Weight:      Height:        General: Pt is alert, awake, not in acute distress. Elderly frail  Cardiovascular: RRR, S1/S2 +, no rubs, no gallops Respiratory: CTA bilaterally, no wheezing, no rhonchi Abdominal: Soft, NT, ND, bowel sounds + Extremities: no edema, no cyanosis  Discharge Instructions   Allergies as of 09/02/2021   No Known Allergies      Medication List     TAKE these medications    clobetasol ointment 0.05 % Commonly known as: TEMOVATE Apply 1 application topically 2 (two) times daily. What changed:  when to take this reasons to take this   donepezil 10 MG tablet Commonly known as: ARICEPT Take 1 tablet (10 mg total) by mouth at bedtime.   melatonin 1 MG Tabs tablet Take 1 mg by mouth at bedtime.   metFORMIN 500 MG tablet Commonly known as: GLUCOPHAGE Take 500 mg by mouth 2 (two) times daily.   sertraline 50 MG tablet Commonly known as: ZOLOFT Take 50 mg by mouth at bedtime.        Follow-up Information     Jodi Dad, MD. Schedule an appointment as soon as possible for a  visit in 1 week(s).   Specialty: Internal Medicine Contact information: [place] [address] [phone]                No Known Allergies  You were cared for by a hospitalist during your hospital stay. If you have any questions about your discharge medications or the care you received while you were in the hospital after you are discharged, you can call the unit and asked to speak with the hospitalist on call if the hospitalist that took care of you is not available. Once you are discharged, your primary care physician will handle any further  medical issues. Please note that no refills for any discharge medications will be authorized once you are discharged, as it is imperative that you return to your primary care physician (or establish a relationship with a primary care physician if you do not have one) for your aftercare needs so that they can reassess your need for medications and monitor your lab values.   Procedures/Studies: CT Head Wo Contrast  Result Date: 09/01/2021 CLINICAL DATA:  Trauma. EXAM: CT HEAD WITHOUT CONTRAST CT CERVICAL SPINE WITHOUT CONTRAST TECHNIQUE: Multidetector CT imaging of the head and cervical spine was performed following the standard protocol without intravenous contrast. Multiplanar CT image reconstructions of the cervical spine were also generated. RADIATION DOSE REDUCTION: This exam was performed according to the departmental dose-optimization program which includes automated exposure control, adjustment of the mA and/or kV according to patient size and/or use of iterative reconstruction technique. COMPARISON:  Brain MRI dated 03/24/2021. FINDINGS: CT HEAD FINDINGS Brain: Mild age-related atrophy and chronic microvascular ischemic changes. There is no acute intracranial hemorrhage. No mass effect or midline shift. No extra-axial fluid collection. Vascular: No hyperdense vessel or unexpected calcification. Skull: Normal. Negative for fracture or focal lesion. Sinuses/Orbits: No acute finding. Other: None CT CERVICAL SPINE FINDINGS Alignment: No acute subluxation. Skull base and vertebrae: No acute fracture. Soft tissues and spinal canal: No prevertebral fluid or swelling. No visible canal hematoma. Disc levels:  No acute findings.  No degenerative changes. Upper chest: Negative. Other: Bilateral carotid bulb calcified plaques. IMPRESSION: 1. No acute intracranial pathology. Mild age-related atrophy and chronic microvascular ischemic changes. 2. No acute cervical spine fracture or subluxation. Electronically Signed    By: Anner Crete M.D.   On: 09/01/2021 02:26   CT Cervical Spine Wo Contrast  Result Date: 09/01/2021 CLINICAL DATA:  Trauma. EXAM: CT HEAD WITHOUT CONTRAST CT CERVICAL SPINE WITHOUT CONTRAST TECHNIQUE: Multidetector CT imaging of the head and cervical spine was performed following the standard protocol without intravenous contrast. Multiplanar CT image reconstructions of the cervical spine were also generated. RADIATION DOSE REDUCTION: This exam was performed according to the departmental dose-optimization program which includes automated exposure control, adjustment of the mA and/or kV according to patient size and/or use of iterative reconstruction technique. COMPARISON:  Brain MRI dated 03/24/2021. FINDINGS: CT HEAD FINDINGS Brain: Mild age-related atrophy and chronic microvascular ischemic changes. There is no acute intracranial hemorrhage. No mass effect or midline shift. No extra-axial fluid collection. Vascular: No hyperdense vessel or unexpected calcification. Skull: Normal. Negative for fracture or focal lesion. Sinuses/Orbits: No acute finding. Other: None CT CERVICAL SPINE FINDINGS Alignment: No acute subluxation. Skull base and vertebrae: No acute fracture. Soft tissues and spinal canal: No prevertebral fluid or swelling. No visible canal hematoma. Disc levels:  No acute findings.  No degenerative changes. Upper chest: Negative. Other: Bilateral carotid bulb calcified plaques. IMPRESSION: 1. No  acute intracranial pathology. Mild age-related atrophy and chronic microvascular ischemic changes. 2. No acute cervical spine fracture or subluxation. Electronically Signed   By: Anner Crete M.D.   On: 09/01/2021 02:26   DG Wrist Complete Right  Result Date: 08/31/2021 CLINICAL DATA:  Wrist pain EXAM: RIGHT WRIST - COMPLETE 3+ VIEW COMPARISON:  None Available. FINDINGS: No fracture or malalignment. Chondrocalcinosis. Mild radiocarpal degenerative change. Moderate arthritis at the first [facility] joint  and STT interval. IMPRESSION: Moderate wrist arthritis.  Chondrocalcinosis Electronically Signed   By: Donavan Foil M.D.   On: 08/31/2021 23:58   DG Hand 2 View Right  Result Date: 08/31/2021 CLINICAL DATA:  Hand pain EXAM: RIGHT HAND - 2 VIEW COMPARISON:  None Available. FINDINGS: No fracture or malalignment. Joint space narrowing and degenerative change at the first IP joint, the second through fifth D IP and PIP joints, and the second and third MCP joints. Moderate arthritis at the first [facility] joint and STT interval. Mild joint space narrowing at the radiocarpal interval with cartilaginous calcifications. IMPRESSION: 1. No acute osseous abnormality 2. 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 Electronically Signed   By: Donavan Foil M.D.   On: 08/31/2021 23:57   DG Chest Portable 1 View  Result Date: 08/31/2021 CLINICAL DATA:  Syncope EXAM: PORTABLE CHEST 1 VIEW COMPARISON:  08/30/2014 FINDINGS: The heart size and mediastinal contours are within normal limits. Aortic atherosclerosis. Both lungs are clear. The visualized skeletal structures are unremarkable. IMPRESSION: No active disease. Electronically Signed   By: Donavan Foil M.D.   On: 08/31/2021 23:55     The results of significant diagnostics from this hospitalization (including imaging, microbiology, ancillary and laboratory) are listed below for reference.     Microbiology: No results found for this or any previous visit (from the past 240 hour(s)).   Labs: BNP (last 3 results) No results for input(s): "BNP" in the last 8760 hours. Basic Metabolic Panel: Recent Labs  Lab 08/31/21 2312 09/01/21 0813 09/02/21 0042  NA 130* 134* 132*  K 3.6 3.5 3.4*  CL 94* 98 98  CO2 '25 24 25  '$ GLUCOSE 167* 160* 118*  BUN 6* 7* 5*  CREATININE 0.85 0.71 0.63  CALCIUM 8.8* 9.1 8.9   Liver Function Tests: Recent Labs  Lab 08/31/21 2312 09/02/21 0042  AST 33 25  ALT 45* 35  ALKPHOS 59 55  BILITOT 0.2* 0.9  PROT 6.9 6.2*   ALBUMIN 3.8 3.5   No results for input(s): "LIPASE", "AMYLASE" in the last 168 hours. No results for input(s): "AMMONIA" in the last 168 hours. CBC: Recent Labs  Lab 08/31/21 2312 09/01/21 0631 09/02/21 0245  WBC 12.6* 12.9* 8.5  NEUTROABS 10.0* 10.1*  --   HGB 12.5 14.4 11.9*  HCT 36.8 40.9 34.1*  MCV 90.2 88.3 88.6  PLT 238 226 194   Cardiac Enzymes: Recent Labs  Lab 09/01/21 0813  CKTOTAL 259*   BNP: Invalid input(s): "POCBNP" CBG: Recent Labs  Lab 08/31/21 2319 09/01/21 0538  GLUCAP 161* 170*   D-Dimer No results for input(s): "DDIMER" in the last 72 hours. Hgb A1c No results for input(s): "HGBA1C" in the last 72 hours. Lipid Profile No results for input(s): "CHOL", "HDL", "LDLCALC", "TRIG", "CHOLHDL", "LDLDIRECT" in the last 72 hours. Thyroid function studies Recent Labs    08/31/21 2312  TSH 2.192   Anemia work up No results for input(s): "VITAMINB12", "FOLATE", "FERRITIN", "TIBC", "IRON", "RETICCTPCT" in the last 72 hours. Urinalysis    Component Value  Date/Time   COLORURINE STRAW (A) 09/01/2021 0114   APPEARANCEUR CLEAR 09/01/2021 0114   LABSPEC 1.003 (L) 09/01/2021 0114   PHURINE 6.0 09/01/2021 0114   GLUCOSEU NEGATIVE 09/01/2021 0114   HGBUR NEGATIVE 09/01/2021 0114   BILIRUBINUR NEGATIVE 09/01/2021 0114   KETONESUR NEGATIVE 09/01/2021 0114   PROTEINUR NEGATIVE 09/01/2021 0114   UROBILINOGEN 0.2 08/30/2014 1907   NITRITE NEGATIVE 09/01/2021 0114   LEUKOCYTESUR NEGATIVE 09/01/2021 0114   Sepsis Labs Recent Labs  Lab 08/31/21 2312 09/01/21 0631 09/02/21 0245  WBC 12.6* 12.9* 8.5   Microbiology No results found for this or any previous visit (from the past 240 hour(s)).   Time coordinating discharge:  I have spent 35 minutes face to face with the patient and on the ward discussing the patients care, assessment, plan and disposition with other care givers. >50% of the time was devoted counseling the patient about the risks and  benefits of treatment/Discharge disposition and coordinating care.   SIGNED:   Damita Lack, MD  [facility] 09/02/2021, 1:24 PM   If 7PM-7AM, please contact night-coverage

## 2021-09-02 NOTE — Evaluation (Signed)
Occupational Therapy Evaluation Patient Details Name: Jodi Summers MRN: [id number] DOB: January 30, 1941 Today's Date: 09/02/2021   History of Present Illness Pt is a 81 y/o female admitted following fall and seizure like activity. Pt was getting ready to d/c and had syncopal type episode and was re-admitted. PMH includes dementia.   Clinical Impression   Pt admitted for concerns listed above. PTA pt reported that she was independent with all ADL's and functional mobility. At this time, pt presents with mild weakness, requiring up to min guard assist. She continues to need verbal cuing for safety, which is near her baseline. Recommending skilled therapy at her ALF once discharged. OT signing off, as pt has no further acute OT needs.       Recommendations for follow up therapy are one component of a multi-disciplinary discharge planning process, led by the attending physician.  Recommendations may be updated based on patient status, additional functional criteria and insurance authorization.   Follow Up Recommendations  Skilled nursing-short term rehab (<3 hours/day)    Assistance Recommended at Discharge Intermittent Supervision/Assistance  Patient can return home with the following A little help with walking and/or transfers;A little help with bathing/dressing/bathroom;Assistance with cooking/housework;Direct supervision/assist for medications management;Direct supervision/assist for financial management;Assist for transportation    Functional Status Assessment  Patient has had a recent decline in their functional status and demonstrates the ability to make significant improvements in function in a reasonable and predictable amount of time.  Equipment Recommendations  Other (comment)    Recommendations for Other Services       Precautions / Restrictions Precautions Precautions: Fall Required Braces or Orthoses: Other Brace Other Brace: given wrist brace for R  wrist Restrictions Weight Bearing Restrictions: No      Mobility Bed Mobility Overal bed mobility: Needs Assistance Bed Mobility: Supine to Sit, Sit to Supine     Supine to sit: Min guard Sit to supine: Min guard   General bed mobility comments: Min guard for safety. Using bed rail for assist.    Transfers Overall transfer level: Needs assistance Equipment used: None Transfers: Sit to/from Stand Sit to Stand: Min guard           General transfer comment: min guard for safety      Balance Overall balance assessment: Needs assistance Sitting-balance support: No upper extremity supported, Feet supported Sitting balance-Leahy Scale: Good     Standing balance support: No upper extremity supported Standing balance-Leahy Scale: Fair                             ADL either performed or assessed with clinical judgement   ADL Overall ADL's : Needs assistance/impaired Eating/Feeding: Set up;Sitting   Grooming: Set up;Sitting   Upper Body Bathing: Set up;Sitting   Lower Body Bathing: Min guard;Sitting/lateral leans;Sit to/from stand   Upper Body Dressing : Set up;Sitting   Lower Body Dressing: Min guard;Sitting/lateral leans;Sit to/from stand   Toilet Transfer: Min guard;Ambulation   Toileting- Clothing Manipulation and Hygiene: Min guard;Sitting/lateral lean;Sit to/from stand       Functional mobility during ADLs: Min guard General ADL Comments: Min guard for safety, limited distance due to poor activity tolerance.     Vision Baseline Vision/History: 1 Wears glasses Ability to See in Adequate Light: 0 Adequate Patient Visual Report: No change from baseline Vision Assessment?: No apparent visual deficits     Perception     Praxis      Pertinent Vitals/Pain  Pain Assessment Pain Assessment: Faces Faces Pain Scale: Hurts little more Pain Location: R wrist Pain Descriptors / Indicators: Grimacing, Guarding Pain Intervention(s): Limited  activity within patient's tolerance, Monitored during session     Hand Dominance Right   Extremity/Trunk Assessment Upper Extremity Assessment Upper Extremity Assessment: RUE deficits/detail RUE Deficits / Details: RUE swollen, wrist wrapped in ace bandage and bruised RUE Coordination: decreased fine motor   Lower Extremity Assessment Lower Extremity Assessment: Generalized weakness   Cervical / Trunk Assessment Cervical / Trunk Assessment: Kyphotic   Communication Communication Communication: HOH   Cognition Arousal/Alertness: Awake/alert Behavior During Therapy: WFL for tasks assessed/performed Overall Cognitive Status: History of cognitive impairments - at baseline                                 General Comments: Dementia at baseline     General Comments  Caregiver/sitter present throughout.    Exercises     Shoulder Instructions      Home Living Family/patient expects to be discharged to:: Assisted living                             Home Equipment: Kasandra Knudsen - single point;Shower seat - built in   Additional Comments: [facility]      Prior Functioning/Environment Prior Level of Function : Needs assist             Mobility Comments: Has cane if she needs it ADLs Comments: Independent with bathing dressing; assist with med management from nursing staff        OT Problem List: Decreased strength;Decreased activity tolerance;Impaired balance (sitting and/or standing);Decreased safety awareness;Decreased cognition;Cardiopulmonary status limiting activity      OT Treatment/Interventions:      OT Goals(Current goals can be found in the care plan section) Acute Rehab OT Goals Patient Stated Goal: To not fall again OT Goal Formulation: With patient Time For Goal Achievement: 09/02/21 Potential to Achieve Goals: Good  OT Frequency:      Co-evaluation              AM-PAC OT "6 Clicks" Daily Activity     Outcome Measure Help  from another person eating meals?: A Little Help from another person taking care of personal grooming?: A Little Help from another person toileting, which includes using toliet, bedpan, or urinal?: A Little Help from another person bathing (including washing, rinsing, drying)?: A Little Help from another person to put on and taking off regular upper body clothing?: A Little Help from another person to put on and taking off regular lower body clothing?: A Little 6 Click Score: 18   End of Session Equipment Utilized During Treatment: Gait belt Nurse Communication: Mobility status  Activity Tolerance: Patient tolerated treatment well Patient left: in bed;with call bell/phone within reach;with nursing/sitter in room;with bed alarm set  OT Visit Diagnosis: Unsteadiness on feet (R26.81);Other abnormalities of gait and mobility (R26.89);Muscle weakness (generalized) (M62.81)                Time: 8469-6295 OT Time Calculation (min): 12 min Charges:  OT General Charges $OT Visit: 1 Visit OT Evaluation $OT Eval Moderate Complexity: 1 Mod  Glendon Dunwoody H., OTR/L Acute Rehabilitation  Tevan Marian Elane Orlinda Slomski 09/02/2021, 3:02 PM

## 2021-09-02 NOTE — ED Notes (Signed)
Daughter requested MD to contact her as [facility] has a bed for pt going to Rehab part of facility. Pt was initially at the Assisted Living part of facility. Daughter is leaving bedside now as she has a plane to catch going to [place] today. Facility is willing to take pt today as long as paperwork gets done. MD notified of dispo at this time. Well [employer] who coordinates care's number is [phone] with the name of Katy Fitch. Will continue to monitor.

## 2021-09-02 NOTE — Progress Notes (Signed)
Physical Therapy Treatment Patient Details Name: Jodi Summers MRN: [id number] DOB: 1940/07/19 Today's Date: 09/02/2021   History of Present Illness Pt is a 81 y/o female admitted following fall and seizure like activity. Pt was getting ready to d/c and had syncopal type episode and was re-admitted. PMH includes dementia.    PT Comments    Pt progressing towards goals. Able to increase ambulation distance this session. Requiring min guard A for mobility tasks. Also reviewed seated and supine HEP with pt. Per notes, plan is to go to rehab portion of [facility], so recommendations updated. Will continue to follow acutely.   Recommendations for follow up therapy are one component of a multi-disciplinary discharge planning process, led by the attending physician.  Recommendations may be updated based on patient status, additional functional criteria and insurance authorization.  Follow Up Recommendations  Skilled nursing-short term rehab (<3 hours/day) (Per notes, going to rehab portion of [facility]) Can patient physically be transported by private vehicle: Yes   Assistance Recommended at Discharge Frequent or constant Supervision/Assistance  Patient can return home with the following A little help with walking and/or transfers;A little help with bathing/dressing/bathroom;Direct supervision/assist for medications management;Assistance with cooking/housework;Assist for transportation   Equipment Recommendations  None recommended by PT    Recommendations for Other Services       Precautions / Restrictions Precautions Precautions: Fall Required Braces or Orthoses: Other Brace Other Brace: given wrist brace for R wrist Restrictions Weight Bearing Restrictions: No     Mobility  Bed Mobility Overal bed mobility: Needs Assistance Bed Mobility: Supine to Sit, Sit to Supine     Supine to sit: Min guard Sit to supine: Min guard   General bed mobility comments: Min guard for safety.  Using bed rail for assist.    Transfers Overall transfer level: Needs assistance Equipment used: None Transfers: Sit to/from Stand Sit to Stand: Min guard           General transfer comment: min guard for safety    Ambulation/Gait Ambulation/Gait assistance: Min guard Gait Distance (Feet): 15 Feet Assistive device: None Gait Pattern/deviations: Step-through pattern Gait velocity: Decreased     General Gait Details: Min guard for safety to take steps forwards and backwards. Pt asymptomatic throughout.   Stairs             Wheelchair Mobility    Modified Rankin (Stroke Patients Only)       Balance Overall balance assessment: Needs assistance Sitting-balance support: No upper extremity supported, Feet supported Sitting balance-Leahy Scale: Fair     Standing balance support: No upper extremity supported Standing balance-Leahy Scale: Fair                              Cognition Arousal/Alertness: Awake/alert Behavior During Therapy: WFL for tasks assessed/performed Overall Cognitive Status: History of cognitive impairments - at baseline                                 General Comments: Dementia at baseline        Exercises General Exercises - Lower Extremity Long Arc Quad: AROM, Both, 10 reps, Seated Hip ABduction/ADduction: AROM, Both, 10 reps, Supine Straight Leg Raises: AROM, Both, 10 reps, Supine Other Exercises Other Exercises: sit<>stand X10 for LE strengthening.    General Comments General comments (skin integrity, edema, etc.): Caregiver present during session  Pertinent Vitals/Pain Pain Assessment Pain Assessment: Faces Faces Pain Scale: Hurts little more Pain Location: R wrist Pain Descriptors / Indicators: Grimacing, Guarding Pain Intervention(s): Limited activity within patient's tolerance, Monitored during session, Repositioned    Home Living                          Prior Function             PT Goals (current goals can now be found in the care plan section) Acute Rehab PT Goals Patient Stated Goal: to go back to ALF PT Goal Formulation: With patient/family Time For Goal Achievement: 09/15/21 Potential to Achieve Goals: Good Progress towards PT goals: Progressing toward goals    Frequency    Min 2X/week      PT Plan Frequency needs to be updated;Discharge plan needs to be updated    Co-evaluation              AM-PAC PT "6 Clicks" Mobility   Outcome Measure  Help needed turning from your back to your side while in a flat bed without using bedrails?: None Help needed moving from lying on your back to sitting on the side of a flat bed without using bedrails?: A Little Help needed moving to and from a bed to a chair (including a wheelchair)?: A Little Help needed standing up from a chair using your arms (e.g., wheelchair or bedside chair)?: A Little Help needed to walk in hospital room?: A Little Help needed climbing 3-5 steps with a railing? : A Lot 6 Click Score: 18    End of Session Equipment Utilized During Treatment: Gait belt Activity Tolerance: Patient limited by fatigue;Treatment limited secondary to medical complications (Comment) (drop in BP) Patient left: in bed;with call bell/phone within reach;with family/visitor present (on stretcher in ED) Nurse Communication: Mobility status PT Visit Diagnosis: Unsteadiness on feet (R26.81);Muscle weakness (generalized) (M62.81);History of falling (Z91.81)     Time: 1354-1410 PT Time Calculation (min) (ACUTE ONLY): 16 min  Charges:  $Therapeutic Activity: 8-22 mins                     Lou Miner, DPT  Acute Rehabilitation Services  Office: [phone]    Rudean Hitt 09/02/2021, 2:27 PM

## 2021-09-02 NOTE — ED Notes (Signed)
Found pt trying to get out of bed. Pt had removed PIV and blood and fluid were all over the floor. Attempted to redirect pt at this time with continued education on staying in bed and using the call bell. Pt advises that she need to go to the restroom. Pt assisted to bedside commode at this time. While pt was using the restroom the stretcher was cleaned, sheets changed, placed pt in new gown, floor cleaned

## 2021-09-02 NOTE — ED Notes (Signed)
PTAR at bedside to take pt back to [facility]. Daughter, Asheley notified of pt discharge back to facility. Report given to Santiago Glad from [facility] at this time explaining dc and ED treatment.

## 2021-09-02 NOTE — TOC CM/SW Note (Signed)
This RNCM received call from [facility], regarding patient no longer being admitted patient can be d/c from the ED. Per chart review patient is currently set up as inpt admission. Per Bennet with  [facility] [phone] has called requesting a FL2, as [facility] is ready to accept the patient today, with transport via [facility]. RNCM notified SW to follow up.    TOC will continue to follow.

## 2021-09-03 ENCOUNTER — Encounter: Payer: Self-pay | Admitting: Adult Health

## 2021-09-03 ENCOUNTER — Non-Acute Institutional Stay (SKILLED_NURSING_FACILITY): Payer: Medicare PPO | Admitting: Adult Health

## 2021-09-03 DIAGNOSIS — E871 Hypo-osmolality and hyponatremia: Secondary | ICD-10-CM | POA: Diagnosis not present

## 2021-09-03 DIAGNOSIS — W19XXXD Unspecified fall, subsequent encounter: Secondary | ICD-10-CM

## 2021-09-03 DIAGNOSIS — F0390 Unspecified dementia without behavioral disturbance: Secondary | ICD-10-CM

## 2021-09-03 DIAGNOSIS — R55 Syncope and collapse: Secondary | ICD-10-CM | POA: Diagnosis not present

## 2021-09-03 DIAGNOSIS — R569 Unspecified convulsions: Secondary | ICD-10-CM | POA: Diagnosis not present

## 2021-09-03 DIAGNOSIS — D72829 Elevated white blood cell count, unspecified: Secondary | ICD-10-CM

## 2021-09-03 DIAGNOSIS — F4321 Adjustment disorder with depressed mood: Secondary | ICD-10-CM | POA: Diagnosis not present

## 2021-09-03 DIAGNOSIS — E782 Mixed hyperlipidemia: Secondary | ICD-10-CM | POA: Diagnosis not present

## 2021-09-03 DIAGNOSIS — E118 Type 2 diabetes mellitus with unspecified complications: Secondary | ICD-10-CM

## 2021-09-03 DIAGNOSIS — R41841 Cognitive communication deficit: Secondary | ICD-10-CM | POA: Diagnosis not present

## 2021-09-03 DIAGNOSIS — I951 Orthostatic hypotension: Secondary | ICD-10-CM

## 2021-09-03 DIAGNOSIS — S63501D Unspecified sprain of right wrist, subsequent encounter: Secondary | ICD-10-CM

## 2021-09-03 LAB — URINE CULTURE

## 2021-09-03 NOTE — Progress Notes (Addendum)
Location:  [facility] Room Number: 153-A Place of Service:  SNF [phone]) Provider: Earney Hamburg, MD  Patient Care Team: [name], MD as PCP - General (Internal Medicine)  Extended Emergency Contact Information Primary Emergency Contact: Bufford Spikes, [place] [address] Johnnette Litter of [place] Phone: [phone] Mobile Phone: [phone] Relation: Daughter Secondary Emergency Contact: [place] Mobile Phone: [phone] Relation: Other  Code Status:  Full Code Goals of care: Advanced Directive information    09/03/2021    9:07 AM  Advanced Directives  Does Patient Have a Medical Advance Directive? Yes  Type of Paramedic of [place];Living will  Does patient want to make changes to medical advance directive? No - Patient declined  Copy of [place] in Chart? Yes - validated most recent copy scanned in chart (See row information)     Chief Complaint  Patient presents with   Acute Visit    Fall    HPI:  Pt is a 81 y.o. female with PMH significant for memory loss, Type II DM, and depression, seen today for an acute visit for fall. Pt noted to have a fall in assisted living on 7/17. The notes indicate she fell face down and was awake. When the nurse asked the resident what happened she stated she fell and hit her head. Then she proceeded to have a seizure with LOC and jerking of fingers and eyes for 2 min. She bit her lip as well. She was sent to the ER .  CT of the head and neck showed no acute changes. CXR showed no active disease. Initial NA was 130, repeat 132. WBC 12.6 repeat 12.9. UA was negative initially and final culture showed multiple species. Initial lactic acid was 2.2, repeat 1.4.   She returned to baseline and was discharged but then she was walking to the BR in the ER and had an orthostatic syncope episode. ECG showed first degree AV  block, early transition with no ST or T wave abnormalities. QTc .421 which is close to baseline.  She was given lactated ringers and appeared to be at baseline and then was discharged to [place] rehab.  Also she had some right wrist pain and was diagnosed with a wrist sprain. Continues with mild pain. Was given a wrist brace. Also was referred back to neurology, she already sees them due to her hx of dementia.  She appears to be at baseline and has no acute complaints.   Of note Aricept was increased to 10 mg in May I started her on Zoloft for depression in June.  She moved to AL in June due to progression in memory loss.  Tends to barricade herself in her room with items in front of the door so she knows someone is coming.   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   Hyperlipidemia 11/30/2018   Knee pain 26/20/3559   Lichen sclerosus 74/16/3845   Low back pain 02/22/2017   Malignant neoplasm of  upper-outer quadrant of right female breast 1999   Pain in toe 12/06/2019   Personal history of chemotherapy    Personal history of radiation therapy    Unilateral primary osteoarthritis, 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   Past Surgical History:  Procedure Laterality Date   ABDOMINAL HYSTERECTOMY  1998   fibroid   BREAST LUMPECTOMY Right 1999   CHOLECYSTECTOMY     JOINT REPLACEMENT     L knee replacement   TONSILLECTOMY     TOTAL HIP ARTHROPLASTY Right 10/26/2019   Procedure: RIGHT TOTAL HIP ARTHROPLASTY ANTERIOR APPROACH;  Surgeon: Mcarthur Rossetti, MD;  Location: [facility];  Service: Orthopedics;  Laterality: Right;   WISDOM TOOTH EXTRACTION      No Known Allergies  Outpatient Encounter Medications as of 09/03/2021  Medication Sig   clobetasol ointment (TEMOVATE) 6.01 % Apply 1 application topically 2 (two) times daily. (Patient taking differently: Apply 1 application  topically 2 (two) times daily as needed (vaginal itching).)   donepezil (ARICEPT) 10 MG tablet Take 1 tablet (10 mg total) by mouth at bedtime.   melatonin 1 MG TABS tablet Take 1 mg by mouth at bedtime.   metFORMIN (GLUCOPHAGE) 500 MG tablet Take 500 mg by mouth 2 (two) times daily.   sertraline (ZOLOFT) 50 MG tablet Take 50 mg by mouth at bedtime.   No facility-administered encounter medications on file as of 09/03/2021.    Review of Systems  Constitutional:  Positive for fatigue. Negative for activity change, appetite change, chills, diaphoresis, fever and unexpected weight change.  HENT:  Negative for congestion.   Respiratory:  Negative for cough, shortness of breath and wheezing.   Cardiovascular:  Negative for chest pain, palpitations and leg swelling.  Gastrointestinal:  Negative for abdominal distention, abdominal pain, constipation and diarrhea.  Genitourinary:  Negative for difficulty urinating and dysuria.  Musculoskeletal:  Negative for arthralgias, back pain, gait problem, joint swelling and myalgias.  Neurological:  Positive for seizures. Negative for dizziness, tremors, syncope, facial asymmetry, speech difficulty, weakness, light-headedness, numbness and headaches.  Psychiatric/Behavioral:  Positive for confusion. Negative for agitation and behavioral problems.     Immunization History  Administered Date(s) Administered   Moderna SARS-COV2 Booster Vaccination 01/01/2020   Moderna Sars-Covid-2 Vaccination 02/14/2019, 03/14/2019   Pertinent  Health Maintenance Due  Topic Date Due   FOOT EXAM  Never done   OPHTHALMOLOGY EXAM  Never  done   URINE MICROALBUMIN  Never done   DEXA SCAN  Never done   INFLUENZA VACCINE  09/15/2021   HEMOGLOBIN A1C  01/26/2022      07/17/2021    4:23 PM 08/31/2021   10:37 PM 09/01/2021    5:46 AM 09/01/2021    6:09 PM 09/02/2021    5:45 AM  Fall Risk  Falls in the past year? 0      Was there an injury with Fall? 0      Fall Risk Category Calculator 0      Fall Risk Category Low      Patient Fall Risk Level Low fall risk High fall risk High fall risk High fall risk High fall risk  Patient at Risk for Falls Due to No Fall Risks      Fall risk Follow up Falls evaluation completed       Functional Status Survey:    Vitals:   09/03/21 0901  BP: (!) 170/80  Pulse: 75  Resp: 19  Temp: 98.9 F (37.2 C)  SpO2: 96%  Weight: 157 lb  12.8 oz (71.6 kg)  Height: '5\' 5"'$  (1.651 m)   Body mass index is 26.26 kg/m. Physical Exam Vitals and nursing note reviewed.  Constitutional:      General: She is not in acute distress.    Appearance: She is not diaphoretic.  HENT:     Head: Normocephalic and atraumatic.     Right Ear: Tympanic membrane normal.     Left Ear: Tympanic membrane normal.     Nose: Nose normal.     Mouth/Throat:     Mouth: Mucous membranes are moist.     Pharynx: Oropharynx is clear.  Eyes:     Extraocular Movements: Extraocular movements intact.     Conjunctiva/sclera: Conjunctivae normal.     Pupils: Pupils are equal, round, and reactive to light.  Neck:     Vascular: No JVD.  Cardiovascular:     Rate and Rhythm: Normal rate and regular rhythm.     Heart sounds: No murmur heard. Pulmonary:     Effort: Pulmonary effort is normal. No respiratory distress.     Breath sounds: Normal breath sounds. No wheezing.  Abdominal:     General: Bowel sounds are normal. There is no distension.     Palpations: Abdomen is soft.     Tenderness: There is no abdominal tenderness.  Musculoskeletal:        General: Swelling (right wrist.) and tenderness (right wrist.) present.      Cervical back: No rigidity or tenderness.     Right lower leg: No edema.     Left lower leg: No edema.  Lymphadenopathy:     Cervical: No cervical adenopathy.  Skin:    General: Skin is warm and dry.     Findings: Bruising (right first metacarpal. No snuff box tenderness.  +CMS to RUE) present.  Neurological:     General: No focal deficit present.     Mental Status: She is alert. Mental status is at baseline.     Cranial Nerves: No cranial nerve deficit.     Comments: Oriented to self and place. Forgetful of the date and the details of her care.   Psychiatric:        Mood and Affect: Mood normal.     Labs reviewed: Recent Labs    08/31/21 2312 09/01/21 0813 09/02/21 0042  NA 130* 134* 132*  K 3.6 3.5 3.4*  CL 94* 98 98  CO2 '25 24 25  '$ GLUCOSE 167* 160* 118*  BUN 6* 7* 5*  CREATININE 0.85 0.71 0.63  CALCIUM 8.8* 9.1 8.9   Recent Labs    04/30/21 0000 08/31/21 2312 09/02/21 0042  AST 31 33 25  ALT 37* 45* 35  ALKPHOS 64 59 55  BILITOT  --  0.2* 0.9  PROT  --  6.9 6.2*  ALBUMIN 4.4 3.8 3.5   Recent Labs    08/31/21 2312 09/01/21 0631 09/02/21 0245  WBC 12.6* 12.9* 8.5  NEUTROABS 10.0* 10.1*  --   HGB 12.5 14.4 11.9*  HCT 36.8 40.9 34.1*  MCV 90.2 88.3 88.6  PLT 238 226 194   Lab Results  Component Value Date   TSH 2.192 08/31/2021   Lab Results  Component Value Date   HGBA1C 8.0 07/27/2021   Lab Results  Component Value Date   CHOL 245 (A) 04/30/2021   HDL 74 (A) 04/30/2021   LDLCALC 142 04/30/2021   TRIG 144 04/30/2021    Significant Diagnostic Results in last 30 days:  CT Head Wo Contrast  Result Date: 09/01/2021 CLINICAL DATA:  Trauma. EXAM: CT HEAD WITHOUT CONTRAST CT CERVICAL SPINE WITHOUT CONTRAST TECHNIQUE: Multidetector CT imaging of the head and cervical spine was performed following the standard protocol without intravenous contrast. Multiplanar CT image reconstructions of the cervical spine were also generated. RADIATION DOSE  REDUCTION: This exam was performed according to the departmental dose-optimization program which includes automated exposure control, adjustment of the mA and/or kV according to patient size and/or use of iterative reconstruction technique. COMPARISON:  Brain MRI dated 03/24/2021. FINDINGS: CT HEAD FINDINGS Brain: Mild age-related atrophy and chronic microvascular ischemic changes. There is no acute intracranial hemorrhage. No mass effect or midline shift. No extra-axial fluid collection. Vascular: No hyperdense vessel or unexpected calcification. Skull: Normal. Negative for fracture or focal lesion. Sinuses/Orbits: No acute finding. Other: None CT CERVICAL SPINE FINDINGS Alignment: No acute subluxation. Skull base and vertebrae: No acute fracture. Soft tissues and spinal canal: No prevertebral fluid or swelling. No visible canal hematoma. Disc levels:  No acute findings.  No degenerative changes. Upper chest: Negative. Other: Bilateral carotid bulb calcified plaques. IMPRESSION: 1. No acute intracranial pathology. Mild age-related atrophy and chronic microvascular ischemic changes. 2. No acute cervical spine fracture or subluxation. Electronically Signed   By: Anner Crete M.D.   On: 09/01/2021 02:26   CT Cervical Spine Wo Contrast  Result Date: 09/01/2021 CLINICAL DATA:  Trauma. EXAM: CT HEAD WITHOUT CONTRAST CT CERVICAL SPINE WITHOUT CONTRAST TECHNIQUE: Multidetector CT imaging of the head and cervical spine was performed following the standard protocol without intravenous contrast. Multiplanar CT image reconstructions of the cervical spine were also generated. RADIATION DOSE REDUCTION: This exam was performed according to the departmental dose-optimization program which includes automated exposure control, adjustment of the mA and/or kV according to patient size and/or use of iterative reconstruction technique. COMPARISON:  Brain MRI dated 03/24/2021. FINDINGS: CT HEAD FINDINGS Brain: Mild age-related  atrophy and chronic microvascular ischemic changes. There is no acute intracranial hemorrhage. No mass effect or midline shift. No extra-axial fluid collection. Vascular: No hyperdense vessel or unexpected calcification. Skull: Normal. Negative for fracture or focal lesion. Sinuses/Orbits: No acute finding. Other: None CT CERVICAL SPINE FINDINGS Alignment: No acute subluxation. Skull base and vertebrae: No acute fracture. Soft tissues and spinal canal: No prevertebral fluid or swelling. No visible canal hematoma. Disc levels:  No acute findings.  No degenerative changes. Upper chest: Negative. Other: Bilateral carotid bulb calcified plaques. IMPRESSION: 1. No acute intracranial pathology. Mild age-related atrophy and chronic microvascular ischemic changes. 2. No acute cervical spine fracture or subluxation. Electronically Signed   By: Anner Crete M.D.   On: 09/01/2021 02:26   DG Wrist Complete Right  Result Date: 08/31/2021 CLINICAL DATA:  Wrist pain EXAM: RIGHT WRIST - COMPLETE 3+ VIEW COMPARISON:  None Available. FINDINGS: No fracture or malalignment. Chondrocalcinosis. Mild radiocarpal degenerative change. Moderate arthritis at the first [facility] joint and STT interval. IMPRESSION: Moderate wrist arthritis.  Chondrocalcinosis Electronically Signed   By: Donavan Foil M.D.   On: 08/31/2021 23:58   DG Hand 2 View Right  Result Date: 08/31/2021 CLINICAL DATA:  Hand pain EXAM: RIGHT HAND - 2 VIEW COMPARISON:  None Available. FINDINGS: No fracture or malalignment. Joint space narrowing and degenerative change at the first IP joint, the second through fifth D IP and PIP joints, and the second and third MCP joints. Moderate arthritis at the first [facility] joint and STT interval. Mild joint space narrowing at the radiocarpal interval with cartilaginous calcifications. IMPRESSION: 1. No  acute osseous abnormality 2. 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 Electronically Signed   By: Donavan Foil M.D.   On:  08/31/2021 23:57   DG Chest Portable 1 View  Result Date: 08/31/2021 CLINICAL DATA:  Syncope EXAM: PORTABLE CHEST 1 VIEW COMPARISON:  08/30/2014 FINDINGS: The heart size and mediastinal contours are within normal limits. Aortic atherosclerosis. Both lungs are clear. The visualized skeletal structures are unremarkable. IMPRESSION: No active disease. Electronically Signed   By: Donavan Foil M.D.   On: 08/31/2021 23:55    Assessment/Plan  1. Seizure ([place]) 1 time event ? Medication effect vs due low bp  Will monitor bp Consider discontinuing zoloft vs aricept (will have Dr Darnell Level f/u)  2. Fall, subsequent encounter Unclear if this was due to low bp? Therapy order, will monitor in rehab.   3. Dementia without behavioral disturbance ([place]) MCI followed by neurology [facility] 03/10/21 25/30 on aricept. Dose increased to 10 mg 5/16. Repeat neuro cognitive testing recommended in 1 year MMSE 24/30 07/20/2021, passed pentagons and clock   4. Controlled type 2 diabetes mellitus with complication, without long-term current use of insulin ([place]) Lab Results  Component Value Date   HGBA1C 8.0 07/27/2021  Metformin resume, A1C due in sept.    5. Sprain of right wrist, subsequent encounter Neg xray in ED If not improving will re xray Has brace Ice Tylenol Rest  6. Leukocytosis, unspecified type Non specific No acute signs of infection, likely stress induced.   7. Hyponatremia Mild, NA 132  8. Mixed hyperlipidemia Lab Results  Component Value Date   LDLCALC 142 04/30/2021   Off repatha, continue to monitor   9. Orthostatic hypotension Staff to check orthostatic vitals qshift.  Rise slowly  Hydrate Consider taper of zoloft.    Family/ staff Communication: nurse  Labs/tests ordered:  NA

## 2021-09-04 DIAGNOSIS — F4321 Adjustment disorder with depressed mood: Secondary | ICD-10-CM | POA: Diagnosis not present

## 2021-09-04 DIAGNOSIS — R278 Other lack of coordination: Secondary | ICD-10-CM | POA: Diagnosis not present

## 2021-09-04 DIAGNOSIS — R2689 Other abnormalities of gait and mobility: Secondary | ICD-10-CM | POA: Diagnosis not present

## 2021-09-04 DIAGNOSIS — R55 Syncope and collapse: Secondary | ICD-10-CM | POA: Diagnosis not present

## 2021-09-04 DIAGNOSIS — R41841 Cognitive communication deficit: Secondary | ICD-10-CM | POA: Diagnosis not present

## 2021-09-04 DIAGNOSIS — G3184 Mild cognitive impairment, so stated: Secondary | ICD-10-CM | POA: Diagnosis not present

## 2021-09-04 DIAGNOSIS — R2681 Unsteadiness on feet: Secondary | ICD-10-CM | POA: Diagnosis not present

## 2021-09-04 DIAGNOSIS — M6389 Disorders of muscle in diseases classified elsewhere, multiple sites: Secondary | ICD-10-CM | POA: Diagnosis not present

## 2021-09-07 ENCOUNTER — Encounter: Payer: Self-pay | Admitting: Internal Medicine

## 2021-09-07 ENCOUNTER — Non-Acute Institutional Stay (SKILLED_NURSING_FACILITY): Payer: Medicare PPO | Admitting: Internal Medicine

## 2021-09-07 DIAGNOSIS — R278 Other lack of coordination: Secondary | ICD-10-CM | POA: Diagnosis not present

## 2021-09-07 DIAGNOSIS — E871 Hypo-osmolality and hyponatremia: Secondary | ICD-10-CM

## 2021-09-07 DIAGNOSIS — G3184 Mild cognitive impairment, so stated: Secondary | ICD-10-CM | POA: Diagnosis not present

## 2021-09-07 DIAGNOSIS — F0390 Unspecified dementia without behavioral disturbance: Secondary | ICD-10-CM

## 2021-09-07 DIAGNOSIS — I951 Orthostatic hypotension: Secondary | ICD-10-CM | POA: Diagnosis not present

## 2021-09-07 DIAGNOSIS — F4321 Adjustment disorder with depressed mood: Secondary | ICD-10-CM | POA: Diagnosis not present

## 2021-09-07 DIAGNOSIS — M6389 Disorders of muscle in diseases classified elsewhere, multiple sites: Secondary | ICD-10-CM | POA: Diagnosis not present

## 2021-09-07 DIAGNOSIS — R2689 Other abnormalities of gait and mobility: Secondary | ICD-10-CM | POA: Diagnosis not present

## 2021-09-07 DIAGNOSIS — R2681 Unsteadiness on feet: Secondary | ICD-10-CM | POA: Diagnosis not present

## 2021-09-07 DIAGNOSIS — E782 Mixed hyperlipidemia: Secondary | ICD-10-CM

## 2021-09-07 DIAGNOSIS — E118 Type 2 diabetes mellitus with unspecified complications: Secondary | ICD-10-CM | POA: Diagnosis not present

## 2021-09-07 DIAGNOSIS — R55 Syncope and collapse: Secondary | ICD-10-CM | POA: Diagnosis not present

## 2021-09-07 DIAGNOSIS — R569 Unspecified convulsions: Secondary | ICD-10-CM | POA: Diagnosis not present

## 2021-09-07 DIAGNOSIS — R41841 Cognitive communication deficit: Secondary | ICD-10-CM | POA: Diagnosis not present

## 2021-09-07 NOTE — Progress Notes (Signed)
Provider:  Veleta Miners MD  Location:    [facility] Room Number: [id number] Place of Service:  SNF ([phone])  PCP: [name], MD Patient Care Team: [name], MD as PCP - General (Internal Medicine)  Extended Emergency Contact Information Primary Emergency Contact: Bufford Spikes, [place] [address] Johnnette Litter of [place] Phone: [phone] Mobile Phone: [phone] Relation: Daughter Secondary Emergency Contact: [place] Mobile Phone: [phone] Relation: Other  Code Status: Full Code Goals of Care: Advanced Directive information    09/07/2021   10:19 AM  Advanced Directives  Does Patient Have a Medical Advance Directive? Yes  Type of Paramedic of [place];Living will  Does patient want to make changes to medical advance directive? No - Patient declined  Copy of [place] in Chart? Yes - validated most recent copy scanned in chart (See row information)      Chief Complaint  Patient presents with   New Admit To SNF    Admission to SNF    HPI: Patient is a 81 y.o. female seen today for admission to SNF  Patient was taken to ED after she fell in her [place] and was noticed to have seizure like activity per staff X-rays and CT scan showed no acute injury. When t ED was discharging her she had another episode of orthostatic syncope when trying to go to the bathroom.   Her orthostatic vital signs were positive.  She was admitted for rehydration. Then discharged to rehab She continued to be  orthostatic in rehab Lying 151/81 sitting 104/61 standing 89/57  Reduced Aricept to 5 mg  encouraged p.o. fluids Since then patient has been doing better. Yesterday patient's vitals were negative for any orthostasis. lying: 132/76, sitting 151/76, standing 126/73  But today patient has been sleeping more per nurses   she  was sleeping in her bed at 10:00 in the morning  and did not want to get up. 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   Hyperlipidemia 11/30/2018   Knee pain 02/54/2706   Lichen sclerosus 23/76/2831   Low back pain 02/22/2017   Malignant neoplasm of upper-outer quadrant of right female breast 1999   Pain in toe 12/06/2019   Personal history of chemotherapy    Personal history of radiation therapy    Unilateral primary osteoarthritis, 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 She has never used smokeless tobacco. She reports that she does not drink alcohol and does not use drugs. Social History   Socioeconomic History   Marital status: Widowed    Spouse name: Not on file   Number of children: Not on file   Years of education: 16   Highest education level: Bachelor's degree  (e.g., BA, AB, BS)  Occupational History   Occupation: Retired    Comment: Pharmacist, hospital  Tobacco Use   Smoking status: Never   Smokeless tobacco: Never  Vaping Use   Vaping Use: Never used  Substance and Sexual Activity   Alcohol use: Never   Drug use: Never   Sexual activity: Not on file    Comment: Hysterectomy  Other Topics Concern   Not on file  Social History Narrative   Right handed   No caffeine   Social Determinants of Health   Financial Resource Strain: Not on file  Food Insecurity: Not on file  Transportation Needs: Not on file  Physical Activity: Not on file  Stress: Not on file  Social Connections: Not on file  Intimate Partner Violence: Not on file    Functional Status Survey:    Family History  Problem Relation Age of Onset   Alzheimer's disease Mother    Alzheimer's disease Sister     Health Maintenance  Topic Date Due   FOOT EXAM  Never done   OPHTHALMOLOGY EXAM  Never done   URINE MICROALBUMIN  Never done   Hepatitis C Screening  Never done   TETANUS/TDAP  Never done   Zoster Vaccines- Shingrix (1 of 2) Never done   Pneumonia Vaccine 63+ Years old (1 - PCV) Never done   DEXA SCAN  Never done   COVID-19 Vaccine (3 - Moderna risk series) 01/29/2020   INFLUENZA VACCINE  09/15/2021   HEMOGLOBIN A1C  01/26/2022   HPV VACCINES  Aged Out    No Known Allergies  Allergies as of 09/07/2021   No Known Allergies      Medication List        Accurate as of September 07, 2021 10:20 AM. If you have any questions, ask your nurse or doctor.          clobetasol ointment 0.05 % Commonly known as: TEMOVATE Apply 1 Application topically 2 (two) times daily. What changed: Another medication with the same name was removed. Continue taking this medication, and follow the directions you see here. Changed by: [name], MD   donepezil 10 MG tablet Commonly known as: ARICEPT Take 1 tablet (10 mg total) by mouth at bedtime.   melatonin 1 MG Tabs  tablet Take 1 mg by mouth at bedtime.   metFORMIN 500 MG tablet Commonly known as: GLUCOPHAGE Take 500 mg by mouth 2 (two) times daily.   sertraline 50 MG tablet Commonly known as: ZOLOFT Take 50 mg by mouth at bedtime.        Review of Systems  Constitutional:  Negative for activity change and appetite change.  HENT: Negative.    Respiratory:  Negative for cough and shortness of breath.   Cardiovascular:  Negative for leg swelling.  Gastrointestinal:  Negative for constipation.  Genitourinary: Negative.   Musculoskeletal:  Negative for arthralgias, gait problem and myalgias.  Skin: Negative.  Neurological:  Positive for weakness. Negative for dizziness.  Psychiatric/Behavioral:  Positive for confusion. Negative for dysphoric mood and sleep disturbance.     Vitals:   09/07/21 1004  BP: (!) 161/73  Pulse: 63  Resp: 17  Temp: (!) 97.2 F (36.2 C)  SpO2: 97%  Weight: 157 lb 12.8 oz (71.6 kg)  Height: '5\' 5"'$  (1.651 m)   Body mass index is 26.26 kg/m. Physical Exam Vitals reviewed.  Constitutional:      Appearance: Normal appearance.  HENT:     Head: Normocephalic.     Nose: Nose normal.     Mouth/Throat:     Mouth: Mucous membranes are moist.     Pharynx: Oropharynx is clear.  Eyes:     Pupils: Pupils are equal, round, and reactive to light.  Cardiovascular:     Rate and Rhythm: Normal rate and regular rhythm.     Pulses: Normal pulses.     Heart sounds: Normal heart sounds. No murmur heard. Pulmonary:     Effort: Pulmonary effort is normal.     Breath sounds: Normal breath sounds.  Abdominal:     General: Abdomen is flat. Bowel sounds are normal.     Palpations: Abdomen is soft.  Musculoskeletal:        General: No swelling.     Cervical back: Neck supple.  Skin:    General: Skin is warm.  Neurological:     General: No focal deficit present.     Mental Status: She is alert.     Comments: No Dizziness  Psychiatric:        Mood and Affect: Mood  normal.        Thought Content: Thought content normal.     Labs reviewed: Basic Metabolic Panel: Recent Labs    08/31/21 2312 09/01/21 0813 09/02/21 0042  NA 130* 134* 132*  K 3.6 3.5 3.4*  CL 94* 98 98  CO2 '25 24 25  '$ GLUCOSE 167* 160* 118*  BUN 6* 7* 5*  CREATININE 0.85 0.71 0.63  CALCIUM 8.8* 9.1 8.9   Liver Function Tests: Recent Labs    04/30/21 0000 08/31/21 2312 09/02/21 0042  AST 31 33 25  ALT 37* 45* 35  ALKPHOS 64 59 55  BILITOT  --  0.2* 0.9  PROT  --  6.9 6.2*  ALBUMIN 4.4 3.8 3.5   No results for input(s): "LIPASE", "AMYLASE" in the last 8760 hours. No results for input(s): "AMMONIA" in the last 8760 hours. CBC: Recent Labs    08/31/21 2312 09/01/21 0631 09/02/21 0245  WBC 12.6* 12.9* 8.5  NEUTROABS 10.0* 10.1*  --   HGB 12.5 14.4 11.9*  HCT 36.8 40.9 34.1*  MCV 90.2 88.3 88.6  PLT 238 226 194   Cardiac Enzymes: Recent Labs    09/01/21 0813  CKTOTAL 259*   BNP: Invalid input(s): "POCBNP" Lab Results  Component Value Date   HGBA1C 8.0 07/27/2021   Lab Results  Component Value Date   TSH 2.192 08/31/2021   Lab Results  Component Value Date   [id number] 299 11/04/2020   No results found for: "FOLATE" No results found for: "IRON", "TIBC", "FERRITIN"  Imaging and Procedures obtained prior to SNF admission: CT Head Wo Contrast  Result Date: 09/01/2021 CLINICAL DATA:  Trauma. EXAM: CT HEAD WITHOUT CONTRAST CT CERVICAL SPINE WITHOUT CONTRAST TECHNIQUE: Multidetector CT imaging of the head and cervical spine was performed following the standard protocol without intravenous contrast. Multiplanar CT image reconstructions of the cervical  spine were also generated. RADIATION DOSE REDUCTION: This exam was performed according to the departmental dose-optimization program which includes automated exposure control, adjustment of the mA and/or kV according to patient size and/or use of iterative reconstruction technique. COMPARISON:  Brain MRI  dated 03/24/2021. FINDINGS: CT HEAD FINDINGS Brain: Mild age-related atrophy and chronic microvascular ischemic changes. There is no acute intracranial hemorrhage. No mass effect or midline shift. No extra-axial fluid collection. Vascular: No hyperdense vessel or unexpected calcification. Skull: Normal. Negative for fracture or focal lesion. Sinuses/Orbits: No acute finding. Other: None CT CERVICAL SPINE FINDINGS Alignment: No acute subluxation. Skull base and vertebrae: No acute fracture. Soft tissues and spinal canal: No prevertebral fluid or swelling. No visible canal hematoma. Disc levels:  No acute findings.  No degenerative changes. Upper chest: Negative. Other: Bilateral carotid bulb calcified plaques. IMPRESSION: 1. No acute intracranial pathology. Mild age-related atrophy and chronic microvascular ischemic changes. 2. No acute cervical spine fracture or subluxation. Electronically Signed   By: Anner Crete M.D.   On: 09/01/2021 02:26   CT Cervical Spine Wo Contrast  Result Date: 09/01/2021 CLINICAL DATA:  Trauma. EXAM: CT HEAD WITHOUT CONTRAST CT CERVICAL SPINE WITHOUT CONTRAST TECHNIQUE: Multidetector CT imaging of the head and cervical spine was performed following the standard protocol without intravenous contrast. Multiplanar CT image reconstructions of the cervical spine were also generated. RADIATION DOSE REDUCTION: This exam was performed according to the departmental dose-optimization program which includes automated exposure control, adjustment of the mA and/or kV according to patient size and/or use of iterative reconstruction technique. COMPARISON:  Brain MRI dated 03/24/2021. FINDINGS: CT HEAD FINDINGS Brain: Mild age-related atrophy and chronic microvascular ischemic changes. There is no acute intracranial hemorrhage. No mass effect or midline shift. No extra-axial fluid collection. Vascular: No hyperdense vessel or unexpected calcification. Skull: Normal. Negative for fracture or focal  lesion. Sinuses/Orbits: No acute finding. Other: None CT CERVICAL SPINE FINDINGS Alignment: No acute subluxation. Skull base and vertebrae: No acute fracture. Soft tissues and spinal canal: No prevertebral fluid or swelling. No visible canal hematoma. Disc levels:  No acute findings.  No degenerative changes. Upper chest: Negative. Other: Bilateral carotid bulb calcified plaques. IMPRESSION: 1. No acute intracranial pathology. Mild age-related atrophy and chronic microvascular ischemic changes. 2. No acute cervical spine fracture or subluxation. Electronically Signed   By: Anner Crete M.D.   On: 09/01/2021 02:26    Assessment/Plan 1. Seizure ([place]) ? Orthostatic Syncope No Post ictal symptoms both episodes Neurology to eval  2. Orthostatic hypotension Aricept reduced to 5 mg  Not orthostatic now Will continue to monitor Excessive sleepiness Decrease Zoloft to 25 mg   3. Dementia without behavioral disturbance ([place]) Was on Aricept Not sure since when she is taking it regularly Right now with these episodes will not increase her dose of Aricept MMSE 24/30 07/20/2021, passed pentagons and clock   her goal is to go to [place]   4. Controlled type 2 diabetes mellitus with complication, without long-term current use of insulin ([place]) Now on Metformin Last A1c was 8 in 06/23 when she was not taking her meds   5. Hyponatremia Mild   6. Mixed hyperlipidemia Allergic to statin  7 Depression with Paranoia Was started on Zoloft But since she is more sleepy will decrease the dose to 25 mg  Family/ staff Communication:   Labs/tests ordered:

## 2021-09-08 DIAGNOSIS — R55 Syncope and collapse: Secondary | ICD-10-CM | POA: Diagnosis not present

## 2021-09-08 DIAGNOSIS — R2681 Unsteadiness on feet: Secondary | ICD-10-CM | POA: Diagnosis not present

## 2021-09-08 DIAGNOSIS — M6389 Disorders of muscle in diseases classified elsewhere, multiple sites: Secondary | ICD-10-CM | POA: Diagnosis not present

## 2021-09-08 DIAGNOSIS — E118 Type 2 diabetes mellitus with unspecified complications: Secondary | ICD-10-CM | POA: Diagnosis not present

## 2021-09-08 DIAGNOSIS — G3184 Mild cognitive impairment, so stated: Secondary | ICD-10-CM | POA: Diagnosis not present

## 2021-09-08 DIAGNOSIS — I10 Essential (primary) hypertension: Secondary | ICD-10-CM | POA: Diagnosis not present

## 2021-09-08 DIAGNOSIS — R278 Other lack of coordination: Secondary | ICD-10-CM | POA: Diagnosis not present

## 2021-09-08 DIAGNOSIS — R2689 Other abnormalities of gait and mobility: Secondary | ICD-10-CM | POA: Diagnosis not present

## 2021-09-08 DIAGNOSIS — E785 Hyperlipidemia, unspecified: Secondary | ICD-10-CM | POA: Diagnosis not present

## 2021-09-08 LAB — CBC: RBC: 4.37 (ref 3.87–5.11)

## 2021-09-08 LAB — BASIC METABOLIC PANEL
BUN: 8 (ref 4–21)
CO2: 21 (ref 13–22)
Chloride: 101 (ref 99–108)
Creatinine: 0.6 (ref 0.5–1.1)
Glucose: 131
Potassium: 4.1 mEq/L (ref 3.5–5.1)
Sodium: 137 (ref 137–147)

## 2021-09-08 LAB — CBC AND DIFFERENTIAL
HCT: 39 (ref 36–46)
Hemoglobin: 13.6 (ref 12.0–16.0)
Platelets: 138 10*3/uL — AB (ref 150–400)
WBC: 8.3

## 2021-09-08 LAB — COMPREHENSIVE METABOLIC PANEL: Calcium: 9 (ref 8.7–10.7)

## 2021-09-09 ENCOUNTER — Encounter: Payer: Self-pay | Admitting: Internal Medicine

## 2021-09-09 DIAGNOSIS — M6389 Disorders of muscle in diseases classified elsewhere, multiple sites: Secondary | ICD-10-CM | POA: Diagnosis not present

## 2021-09-09 DIAGNOSIS — G3184 Mild cognitive impairment, so stated: Secondary | ICD-10-CM | POA: Diagnosis not present

## 2021-09-09 DIAGNOSIS — R55 Syncope and collapse: Secondary | ICD-10-CM | POA: Diagnosis not present

## 2021-09-09 DIAGNOSIS — R278 Other lack of coordination: Secondary | ICD-10-CM | POA: Diagnosis not present

## 2021-09-10 DIAGNOSIS — G3184 Mild cognitive impairment, so stated: Secondary | ICD-10-CM | POA: Diagnosis not present

## 2021-09-10 DIAGNOSIS — R2681 Unsteadiness on feet: Secondary | ICD-10-CM | POA: Diagnosis not present

## 2021-09-10 DIAGNOSIS — R55 Syncope and collapse: Secondary | ICD-10-CM | POA: Diagnosis not present

## 2021-09-10 DIAGNOSIS — F4321 Adjustment disorder with depressed mood: Secondary | ICD-10-CM | POA: Diagnosis not present

## 2021-09-10 DIAGNOSIS — R2689 Other abnormalities of gait and mobility: Secondary | ICD-10-CM | POA: Diagnosis not present

## 2021-09-10 DIAGNOSIS — R41841 Cognitive communication deficit: Secondary | ICD-10-CM | POA: Diagnosis not present

## 2021-09-14 ENCOUNTER — Encounter: Payer: Self-pay | Admitting: Internal Medicine

## 2021-09-14 ENCOUNTER — Non-Acute Institutional Stay (SKILLED_NURSING_FACILITY): Payer: Medicare PPO | Admitting: Internal Medicine

## 2021-09-14 DIAGNOSIS — R41841 Cognitive communication deficit: Secondary | ICD-10-CM | POA: Diagnosis not present

## 2021-09-14 DIAGNOSIS — F4321 Adjustment disorder with depressed mood: Secondary | ICD-10-CM | POA: Diagnosis not present

## 2021-09-14 DIAGNOSIS — M6389 Disorders of muscle in diseases classified elsewhere, multiple sites: Secondary | ICD-10-CM | POA: Diagnosis not present

## 2021-09-14 DIAGNOSIS — E118 Type 2 diabetes mellitus with unspecified complications: Secondary | ICD-10-CM

## 2021-09-14 DIAGNOSIS — E871 Hypo-osmolality and hyponatremia: Secondary | ICD-10-CM

## 2021-09-14 DIAGNOSIS — I951 Orthostatic hypotension: Secondary | ICD-10-CM | POA: Diagnosis not present

## 2021-09-14 DIAGNOSIS — E782 Mixed hyperlipidemia: Secondary | ICD-10-CM

## 2021-09-14 DIAGNOSIS — G301 Alzheimer's disease with late onset: Secondary | ICD-10-CM | POA: Diagnosis not present

## 2021-09-14 DIAGNOSIS — F02B3 Dementia in other diseases classified elsewhere, moderate, with mood disturbance: Secondary | ICD-10-CM | POA: Diagnosis not present

## 2021-09-14 DIAGNOSIS — R55 Syncope and collapse: Secondary | ICD-10-CM | POA: Diagnosis not present

## 2021-09-14 DIAGNOSIS — R278 Other lack of coordination: Secondary | ICD-10-CM | POA: Diagnosis not present

## 2021-09-14 DIAGNOSIS — R2689 Other abnormalities of gait and mobility: Secondary | ICD-10-CM | POA: Diagnosis not present

## 2021-09-14 DIAGNOSIS — R569 Unspecified convulsions: Secondary | ICD-10-CM | POA: Diagnosis not present

## 2021-09-14 DIAGNOSIS — G3184 Mild cognitive impairment, so stated: Secondary | ICD-10-CM | POA: Diagnosis not present

## 2021-09-14 DIAGNOSIS — R2681 Unsteadiness on feet: Secondary | ICD-10-CM | POA: Diagnosis not present

## 2021-09-14 DIAGNOSIS — F32A Depression, unspecified: Secondary | ICD-10-CM

## 2021-09-14 NOTE — Progress Notes (Signed)
Location:    [place] Room Number: 169 Place of Service:  SNF [phone]) Provider:  Veleta Miners MD  [name], MD  Patient Care Team: [name], MD as PCP - General (Internal Medicine)  Extended Emergency Contact Information Primary Emergency Contact: Bufford Spikes, [place] [address] Johnnette Litter of [place] Phone: [phone] Mobile Phone: [phone] Relation: Daughter Secondary Emergency Contact: [place] Mobile Phone: [phone] Relation: Other  Code Status: Full Code Goals of care: Advanced Directive information    09/14/2021   10:35 AM  Advanced Directives  Does Patient Have a Medical Advance Directive? Yes  Type of Paramedic of [place];Living will  Does patient want to make changes to medical advance directive? No - Patient declined  Copy of [place] in Chart? Yes - validated most recent copy scanned in chart (See row information)     Chief Complaint  Patient presents with   Acute Visit    HPI:  Pt is a 81 y.o. female seen today for an acute visit for Depression and Possible discharge from Rehab  Patient was recent move to [place] but was taken to ED  after she fell in her [place] apartment and was noticed to have seizure like activity per staff X-rays and CT scan showed no acute injury. When t ED was discharging her she had another episode of orthostatic syncope when trying to go to the bathroom.   Her orthostatic vital signs were positive.  She was admitted for rehydration. Then discharged to rehab She continued to be  orthostatic in rehab  Since Aricept and Zoloft Reduced not orthostatic Todays BP were 124/78 Lying and 119/71 Standing  Not Dizzy But now she is sleeping/staying in her bed all the time.  Does not want to get up on dress.  Also having paranoid behavior including putting chair to the door so that people cannot enter When I went to her  room patient was still in her bed at 10:00 in the morning.  She told me she has no complaints she was not dizzy.  But when I asked her if she is depressed she became very tearful and states she feels lost. 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   Hyperlipidemia 11/30/2018   Knee pain 86/76/1950   Lichen sclerosus 93/26/7124   Low back pain 02/22/2017   Malignant neoplasm of upper-outer quadrant of right female breast 1999   Pain in toe 12/06/2019   Personal history of chemotherapy    Personal history of radiation therapy    Unilateral primary osteoarthritis, 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   Past Surgical History:  Procedure Laterality Date   ABDOMINAL HYSTERECTOMY  1998   fibroid   BREAST LUMPECTOMY Right 1999   CHOLECYSTECTOMY     JOINT REPLACEMENT     L knee replacement   TONSILLECTOMY     TOTAL HIP ARTHROPLASTY Right 10/26/2019   Procedure: RIGHT TOTAL HIP ARTHROPLASTY ANTERIOR APPROACH;  Surgeon: Mcarthur Rossetti, MD;  Location: [facility];  Service: Orthopedics;  Laterality: Right;  WISDOM TOOTH EXTRACTION      No Known Allergies  Allergies as of 09/14/2021   No Known Allergies      Medication List        Accurate as of September 14, 2021 10:36 AM. If you have any questions, ask your nurse or doctor.          acetaminophen 325 MG tablet Commonly known as: TYLENOL Take 650 mg by mouth every 4 (four) hours as needed.   clobetasol ointment 0.05 % Commonly known as: TEMOVATE Apply 1 Application topically 2 (two) times daily.   donepezil 10 MG tablet Commonly known as: ARICEPT Take 1  tablet (10 mg total) by mouth at bedtime.   melatonin 1 MG Tabs tablet Take 1 mg by mouth at bedtime.   metFORMIN 500 MG tablet Commonly known as: GLUCOPHAGE Take 500 mg by mouth 2 (two) times daily.   sertraline 50 MG tablet Commonly known as: ZOLOFT Take 25 mg by mouth at bedtime.        Review of Systems  Constitutional:  Negative for activity change and appetite change.  HENT: Negative.    Respiratory:  Negative for cough and shortness of breath.   Cardiovascular:  Negative for leg swelling.  Gastrointestinal:  Negative for constipation.  Genitourinary: Negative.   Musculoskeletal:  Negative for arthralgias, gait problem and myalgias.  Skin: Negative.   Neurological:  Negative for dizziness and weakness.  Psychiatric/Behavioral:  Positive for confusion, dysphoric mood and sleep disturbance. The patient is nervous/anxious.     Immunization History  Administered Date(s) Administered   Moderna SARS-COV2 Booster Vaccination 01/01/2020   Moderna Sars-Covid-2 Vaccination 02/14/2019, 03/14/2019   Pertinent  Health Maintenance Due  Topic Date Due   FOOT EXAM  Never done   OPHTHALMOLOGY EXAM  Never done   URINE MICROALBUMIN  Never done   DEXA SCAN  Never done   INFLUENZA VACCINE  09/15/2021   HEMOGLOBIN A1C  01/26/2022      07/17/2021    4:23 PM 08/31/2021   10:37 PM 09/01/2021    5:46 AM 09/01/2021    6:09 PM 09/02/2021    5:45 AM  Fall Risk  Falls in the past year? 0      Was there an injury with Fall? 0      Fall Risk Category Calculator 0      Fall Risk Category Low      Patient Fall Risk Level Low fall risk High fall risk High fall risk High fall risk High fall risk  Patient at Risk for Falls Due to No Fall Risks      Fall risk Follow up Falls evaluation completed       Functional Status Survey:    Vitals:   09/14/21 1024  BP: (!) 159/83  Pulse: 75  Resp: 18  Temp: 98.6 F (37 C)  SpO2: 97%  Weight: 152 lb (68.9 kg)  Height: '5\' 5"'$  (1.651 m)   Body  mass index is 25.29 kg/m. Physical Exam Vitals reviewed.  Constitutional:      Appearance: Normal appearance.  HENT:     Head: Normocephalic.     Nose: Nose normal.     Mouth/Throat:     Mouth: Mucous membranes are moist.     Pharynx: Oropharynx is clear.  Eyes:     Pupils: Pupils are equal, round, and reactive to light.  Cardiovascular:     Rate and Rhythm: Normal rate and regular rhythm.     Pulses: Normal  pulses.     Heart sounds: Normal heart sounds. No murmur heard. Pulmonary:     Effort: Pulmonary effort is normal.     Breath sounds: Normal breath sounds.  Abdominal:     General: Abdomen is flat. Bowel sounds are normal.     Palpations: Abdomen is soft.  Musculoskeletal:        General: No swelling.     Cervical back: Neck supple.  Skin:    General: Skin is warm.  Neurological:     General: No focal deficit present.     Mental Status: She is alert.  Psychiatric:     Comments: Looks very depressed     Labs reviewed: Recent Labs    08/31/21 2312 09/01/21 0813 09/02/21 0042 09/08/21 0000  NA 130* 134* 132* 137  K 3.6 3.5 3.4* 4.1  CL 94* 98 98 101  CO2 '25 24 25 21  '$ GLUCOSE 167* 160* 118*  --   BUN 6* 7* 5* 8  CREATININE 0.85 0.71 0.63 0.6  CALCIUM 8.8* 9.1 8.9 9.0   Recent Labs    04/30/21 0000 08/31/21 2312 09/02/21 0042  AST 31 33 25  ALT 37* 45* 35  ALKPHOS 64 59 55  BILITOT  --  0.2* 0.9  PROT  --  6.9 6.2*  ALBUMIN 4.4 3.8 3.5   Recent Labs    08/31/21 2312 09/01/21 0631 09/02/21 0245 09/08/21 0000  WBC 12.6* 12.9* 8.5 8.3  NEUTROABS 10.0* 10.1*  --   --   HGB 12.5 14.4 11.9* 13.6  HCT 36.8 40.9 34.1* 39  MCV 90.2 88.3 88.6  --   PLT 238 226 194 138*   Lab Results  Component Value Date   TSH 2.192 08/31/2021   Lab Results  Component Value Date   HGBA1C 8.0 07/27/2021   Lab Results  Component Value Date   CHOL 245 (A) 04/30/2021   HDL 74 (A) 04/30/2021   LDLCALC 142 04/30/2021   TRIG 144 04/30/2021    Significant  Diagnostic Results in last 30 days:  CT Head Wo Contrast  Result Date: 09/01/2021 CLINICAL DATA:  Trauma. EXAM: CT HEAD WITHOUT CONTRAST CT CERVICAL SPINE WITHOUT CONTRAST TECHNIQUE: Multidetector CT imaging of the head and cervical spine was performed following the standard protocol without intravenous contrast. Multiplanar CT image reconstructions of the cervical spine were also generated. RADIATION DOSE REDUCTION: This exam was performed according to the departmental dose-optimization program which includes automated exposure control, adjustment of the mA and/or kV according to patient size and/or use of iterative reconstruction technique. COMPARISON:  Brain MRI dated 03/24/2021. FINDINGS: CT HEAD FINDINGS Brain: Mild age-related atrophy and chronic microvascular ischemic changes. There is no acute intracranial hemorrhage. No mass effect or midline shift. No extra-axial fluid collection. Vascular: No hyperdense vessel or unexpected calcification. Skull: Normal. Negative for fracture or focal lesion. Sinuses/Orbits: No acute finding. Other: None CT CERVICAL SPINE FINDINGS Alignment: No acute subluxation. Skull base and vertebrae: No acute fracture. Soft tissues and spinal canal: No prevertebral fluid or swelling. No visible canal hematoma. Disc levels:  No acute findings.  No degenerative changes. Upper chest: Negative. Other: Bilateral carotid bulb calcified plaques. IMPRESSION: 1. No acute intracranial pathology. Mild age-related atrophy and chronic microvascular ischemic changes. 2. No acute cervical spine fracture or subluxation. Electronically Signed   By: Anner Crete M.D.   On: 09/01/2021 02:26   CT Cervical Spine Wo Contrast  Result Date: 09/01/2021 CLINICAL DATA:  Trauma. EXAM: CT HEAD WITHOUT CONTRAST  CT CERVICAL SPINE WITHOUT CONTRAST TECHNIQUE: Multidetector CT imaging of the head and cervical spine was performed following the standard protocol without intravenous contrast. Multiplanar CT  image reconstructions of the cervical spine were also generated. RADIATION DOSE REDUCTION: This exam was performed according to the departmental dose-optimization program which includes automated exposure control, adjustment of the mA and/or kV according to patient size and/or use of iterative reconstruction technique. COMPARISON:  Brain MRI dated 03/24/2021. FINDINGS: CT HEAD FINDINGS Brain: Mild age-related atrophy and chronic microvascular ischemic changes. There is no acute intracranial hemorrhage. No mass effect or midline shift. No extra-axial fluid collection. Vascular: No hyperdense vessel or unexpected calcification. Skull: Normal. Negative for fracture or focal lesion. Sinuses/Orbits: No acute finding. Other: None CT CERVICAL SPINE FINDINGS Alignment: No acute subluxation. Skull base and vertebrae: No acute fracture. Soft tissues and spinal canal: No prevertebral fluid or swelling. No visible canal hematoma. Disc levels:  No acute findings.  No degenerative changes. Upper chest: Negative. Other: Bilateral carotid bulb calcified plaques. IMPRESSION: 1. No acute intracranial pathology. Mild age-related atrophy and chronic microvascular ischemic changes. 2. No acute cervical spine fracture or subluxation. Electronically Signed   By: Anner Crete M.D.   On: 09/01/2021 02:26   DG Wrist Complete Right  Result Date: 08/31/2021 CLINICAL DATA:  Wrist pain EXAM: RIGHT WRIST - COMPLETE 3+ VIEW COMPARISON:  None Available. FINDINGS: No fracture or malalignment. Chondrocalcinosis. Mild radiocarpal degenerative change. Moderate arthritis at the first [facility] joint and STT interval. IMPRESSION: Moderate wrist arthritis.  Chondrocalcinosis Electronically Signed   By: Donavan Foil M.D.   On: 08/31/2021 23:58   DG Hand 2 View Right  Result Date: 08/31/2021 CLINICAL DATA:  Hand pain EXAM: RIGHT HAND - 2 VIEW COMPARISON:  None Available. FINDINGS: No fracture or malalignment. Joint space narrowing and degenerative  change at the first IP joint, the second through fifth D IP and PIP joints, and the second and third MCP joints. Moderate arthritis at the first [facility] joint and STT interval. Mild joint space narrowing at the radiocarpal interval with cartilaginous calcifications. IMPRESSION: 1. No acute osseous abnormality 2. 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 Electronically Signed   By: Donavan Foil M.D.   On: 08/31/2021 23:57   DG Chest Portable 1 View  Result Date: 08/31/2021 CLINICAL DATA:  Syncope EXAM: PORTABLE CHEST 1 VIEW COMPARISON:  08/30/2014 FINDINGS: The heart size and mediastinal contours are within normal limits. Aortic atherosclerosis. Both lungs are clear. The visualized skeletal structures are unremarkable. IMPRESSION: No active disease. Electronically Signed   By: Donavan Foil M.D.   On: 08/31/2021 23:55    Assessment/Plan 1. Acute depression Wellbutrin XL 150 mg QD Urgent referal to Dr Casimiro Needle Possible transfer to her AL room will help with Behavior Continue Zoloft 25 mg 2. 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 Seizure ([place]) ? Orthostatic Syncope No Post ictal symptoms both episodes Neurology to eval in Follow up   4. Orthostatic hypotension Better Dose reduced for Aricept  5. Controlled type 2 diabetes mellitus with complication, without long-term current use of insulin ([place]) Now on Metformin Last A1c was 8 in 06/23 when she was not taking her meds  6. Mixed hyperlipidemia   7. Hyponatremia     Family/ staff Communication:   Labs/tests ordered:

## 2021-09-15 DIAGNOSIS — G3184 Mild cognitive impairment, so stated: Secondary | ICD-10-CM | POA: Diagnosis not present

## 2021-09-15 DIAGNOSIS — R278 Other lack of coordination: Secondary | ICD-10-CM | POA: Diagnosis not present

## 2021-09-15 DIAGNOSIS — R2689 Other abnormalities of gait and mobility: Secondary | ICD-10-CM | POA: Diagnosis not present

## 2021-09-15 DIAGNOSIS — R2681 Unsteadiness on feet: Secondary | ICD-10-CM | POA: Diagnosis not present

## 2021-09-15 DIAGNOSIS — R55 Syncope and collapse: Secondary | ICD-10-CM | POA: Diagnosis not present

## 2021-09-15 DIAGNOSIS — M6389 Disorders of muscle in diseases classified elsewhere, multiple sites: Secondary | ICD-10-CM | POA: Diagnosis not present

## 2021-09-16 DIAGNOSIS — R55 Syncope and collapse: Secondary | ICD-10-CM | POA: Diagnosis not present

## 2021-09-16 DIAGNOSIS — R278 Other lack of coordination: Secondary | ICD-10-CM | POA: Diagnosis not present

## 2021-09-16 DIAGNOSIS — G3184 Mild cognitive impairment, so stated: Secondary | ICD-10-CM | POA: Diagnosis not present

## 2021-09-16 DIAGNOSIS — M6389 Disorders of muscle in diseases classified elsewhere, multiple sites: Secondary | ICD-10-CM | POA: Diagnosis not present

## 2021-09-17 DIAGNOSIS — M6389 Disorders of muscle in diseases classified elsewhere, multiple sites: Secondary | ICD-10-CM | POA: Diagnosis not present

## 2021-09-17 DIAGNOSIS — G3184 Mild cognitive impairment, so stated: Secondary | ICD-10-CM | POA: Diagnosis not present

## 2021-09-17 DIAGNOSIS — R278 Other lack of coordination: Secondary | ICD-10-CM | POA: Diagnosis not present

## 2021-09-17 DIAGNOSIS — R55 Syncope and collapse: Secondary | ICD-10-CM | POA: Diagnosis not present

## 2021-09-17 DIAGNOSIS — R2681 Unsteadiness on feet: Secondary | ICD-10-CM | POA: Diagnosis not present

## 2021-09-17 DIAGNOSIS — R2689 Other abnormalities of gait and mobility: Secondary | ICD-10-CM | POA: Diagnosis not present

## 2021-09-21 ENCOUNTER — Non-Acute Institutional Stay: Payer: Medicare PPO | Admitting: Internal Medicine

## 2021-09-21 DIAGNOSIS — F02B3 Dementia in other diseases classified elsewhere, moderate, with mood disturbance: Secondary | ICD-10-CM

## 2021-09-21 DIAGNOSIS — F4321 Adjustment disorder with depressed mood: Secondary | ICD-10-CM | POA: Diagnosis not present

## 2021-09-21 DIAGNOSIS — F32A Depression, unspecified: Secondary | ICD-10-CM | POA: Diagnosis not present

## 2021-09-21 DIAGNOSIS — R41841 Cognitive communication deficit: Secondary | ICD-10-CM | POA: Diagnosis not present

## 2021-09-21 DIAGNOSIS — R2689 Other abnormalities of gait and mobility: Secondary | ICD-10-CM | POA: Diagnosis not present

## 2021-09-21 DIAGNOSIS — R2681 Unsteadiness on feet: Secondary | ICD-10-CM | POA: Diagnosis not present

## 2021-09-21 DIAGNOSIS — R55 Syncope and collapse: Secondary | ICD-10-CM | POA: Diagnosis not present

## 2021-09-21 DIAGNOSIS — I951 Orthostatic hypotension: Secondary | ICD-10-CM | POA: Diagnosis not present

## 2021-09-21 DIAGNOSIS — G301 Alzheimer's disease with late onset: Secondary | ICD-10-CM | POA: Diagnosis not present

## 2021-09-21 DIAGNOSIS — G3184 Mild cognitive impairment, so stated: Secondary | ICD-10-CM | POA: Diagnosis not present

## 2021-09-21 NOTE — Progress Notes (Signed)
Location: Occupational psychologist of Service:  ALF (13)  Provider:   Code Status: Full Code Goals of Care:     09/14/2021   10:35 AM  Advanced Directives  Does Patient Have a Medical Advance Directive? Yes  Type of Paramedic of [place];Living will  Does patient want to make changes to medical advance directive? No - Patient declined  Copy of [place] in Chart? Yes - validated most recent copy scanned in chart (See row information)     Chief Complaint  Patient presents with   Acute Visit    HPI: Patient is a 81 y.o. female seen today for an acute visit for Depression Orthostatic and Sleeping  Patient has a history of HLD, history of breast cancer, s/p right hip arthroplasty, history of IVH, diabetes type 2  History of Alzheimer's dementia last MMSE 25 out of 30  Patient was recent move to [place] but was taken to ED  after she fell in her [place] apartment and was noticed to have seizure like activity per staff X-rays and CT scan showed no acute injury. When t ED was discharging her she had another episode of orthostatic syncope when trying to go to the bathroom.  Since Aricept and Zoloft reduced Also was started on Wellbutrin and Abilify was added for her paranoid behavior  Patient is in [place] but continues to do have orthostatic blood pressures. Per nurses she is also staying in her bed a lot.  Needing help with her ADLs a lot of cueing. Patient seems more awake to me and more interactive today.  She does look tired.  She denies depression today.  Her paranoid behavior is better.  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   History of hysterectomy 03/18/2021   History of  total knee arthroplasty 01/31/2018   Hyperlipidemia 11/30/2018   Knee pain 40/98/1191   Lichen sclerosus 47/82/9562   Low back pain 02/22/2017   Malignant neoplasm of upper-outer quadrant of right female breast 1999   Pain in toe 12/06/2019   Personal history of chemotherapy    Personal history of radiation therapy    Unilateral primary osteoarthritis, 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    Past Surgical History:  Procedure Laterality Date   ABDOMINAL HYSTERECTOMY  1998   fibroid   BREAST LUMPECTOMY Right 1999   CHOLECYSTECTOMY     JOINT REPLACEMENT     L knee replacement   TONSILLECTOMY     TOTAL HIP ARTHROPLASTY Right 10/26/2019   Procedure: RIGHT TOTAL HIP ARTHROPLASTY ANTERIOR APPROACH;  Surgeon: Mcarthur Rossetti, MD;  Location: [facility];  Service: Orthopedics;  Laterality: Right;   WISDOM TOOTH EXTRACTION      No Known Allergies  Outpatient Encounter Medications as of 09/21/2021  Medication Sig   acetaminophen (TYLENOL) 325 MG tablet Take 650 mg by mouth every 4 (four) hours as needed.   clobetasol ointment (TEMOVATE) 1.30 % Apply 1 Application topically 2 (two) times daily.   metFORMIN (GLUCOPHAGE) 500 MG tablet Take 500 mg by mouth 2 (two) times daily.   sertraline (ZOLOFT) 50 MG tablet Take 25 mg by mouth at bedtime.   [DISCONTINUED] donepezil (ARICEPT) 10 MG tablet Take 1 tablet (10 mg total)  by mouth at bedtime.   [DISCONTINUED] melatonin 1 MG TABS tablet Take 1 mg by mouth at bedtime.   No facility-administered encounter medications on file as of 09/21/2021.    Review of Systems:  Review of Systems  Constitutional:  Positive for activity change. Negative for appetite change.  HENT: Negative.    Respiratory:  Negative for cough and shortness of breath.   Cardiovascular:  Negative for leg swelling.  Gastrointestinal:  Negative for constipation.  Genitourinary: Negative.   Musculoskeletal:  Negative for arthralgias, gait problem and myalgias.  Skin: Negative.    Neurological:  Negative for dizziness and weakness.  Psychiatric/Behavioral:  Positive for confusion and dysphoric mood. Negative for sleep disturbance. The patient is nervous/anxious.     Health Maintenance  Topic Date Due   FOOT EXAM  Never done   OPHTHALMOLOGY EXAM  Never done   URINE MICROALBUMIN  Never done   Hepatitis C Screening  Never done   TETANUS/TDAP  Never done   Zoster Vaccines- Shingrix (1 of 2) Never done   Pneumonia Vaccine 34+ Years old (1 - PCV) Never done   DEXA SCAN  Never done   COVID-19 Vaccine (3 - Moderna risk series) 01/29/2020   INFLUENZA VACCINE  09/15/2021   HEMOGLOBIN A1C  01/26/2022   HPV VACCINES  Aged Out    Physical Exam: Vitals:   09/21/21 0812  BP: (!) 144/77  Pulse: 80  Resp: 20  Temp: 97.9 F (36.6 C)   There is no height or weight on file to calculate BMI. Physical Exam Vitals reviewed.  Constitutional:      Appearance: Normal appearance.  HENT:     Head: Normocephalic.     Nose: Nose normal.     Mouth/Throat:     Mouth: Mucous membranes are moist.     Pharynx: Oropharynx is clear.  Eyes:     Pupils: Pupils are equal, round, and reactive to light.  Cardiovascular:     Rate and Rhythm: Normal rate and regular rhythm.     Pulses: Normal pulses.     Heart sounds: Normal heart sounds. No murmur heard. Pulmonary:     Effort: Pulmonary effort is normal.     Breath sounds: Normal breath sounds.  Abdominal:     General: Abdomen is flat. Bowel sounds are normal.     Palpations: Abdomen is soft.  Musculoskeletal:        General: No swelling.     Cervical back: Neck supple.  Skin:    General: Skin is warm.  Neurological:     General: No focal deficit present.     Mental Status: She is alert.  Psychiatric:        Mood and Affect: Mood normal.        Thought Content: Thought content normal.     Labs reviewed: Basic Metabolic Panel: Recent Labs    11/04/20 0000 04/30/21 0000 08/31/21 2312 09/01/21 0813 09/02/21 0042  09/08/21 0000  NA 142   < > 130* 134* 132* 137  K 4.2   < > 3.6 3.5 3.4* 4.1  CL 104   < > 94* 98 98 101  CO2 26*   < > '25 24 25 21  '$ GLUCOSE  --   --  167* 160* 118*  --   BUN 11   < > 6* 7* 5* 8  CREATININE 0.7   < > 0.85 0.71 0.63 0.6  CALCIUM 9.4   < > 8.8* 9.1 8.9 9.0  TSH 2.18  --  2.192  --   --   --    < > = values in this interval not displayed.   Liver Function Tests: Recent Labs    04/30/21 0000 08/31/21 2312 09/02/21 0042  AST 31 33 25  ALT 37* 45* 35  ALKPHOS 64 59 55  BILITOT  --  0.2* 0.9  PROT  --  6.9 6.2*  ALBUMIN 4.4 3.8 3.5   No results for input(s): "LIPASE", "AMYLASE" in the last 8760 hours. No results for input(s): "AMMONIA" in the last 8760 hours. CBC: Recent Labs    08/31/21 2312 09/01/21 0631 09/02/21 0245 09/08/21 0000  WBC 12.6* 12.9* 8.5 8.3  NEUTROABS 10.0* 10.1*  --   --   HGB 12.5 14.4 11.9* 13.6  HCT 36.8 40.9 34.1* 39  MCV 90.2 88.3 88.6  --   PLT 238 226 194 138*   Lipid Panel: Recent Labs    11/04/20 0000 04/30/21 0000  CHOL 232* 245*  HDL 62 74*  LDLCALC 141 142  TRIG 149 144   Lab Results  Component Value Date   HGBA1C 8.0 07/27/2021    Procedures since last visit: CT Head Wo Contrast  Result Date: 09/01/2021 CLINICAL DATA:  Trauma. EXAM: CT HEAD WITHOUT CONTRAST CT CERVICAL SPINE WITHOUT CONTRAST TECHNIQUE: Multidetector CT imaging of the head and cervical spine was performed following the standard protocol without intravenous contrast. Multiplanar CT image reconstructions of the cervical spine were also generated. RADIATION DOSE REDUCTION: This exam was performed according to the departmental dose-optimization program which includes automated exposure control, adjustment of the mA and/or kV according to patient size and/or use of iterative reconstruction technique. COMPARISON:  Brain MRI dated 03/24/2021. FINDINGS: CT HEAD FINDINGS Brain: Mild age-related atrophy and chronic microvascular ischemic changes. There is no  acute intracranial hemorrhage. No mass effect or midline shift. No extra-axial fluid collection. Vascular: No hyperdense vessel or unexpected calcification. Skull: Normal. Negative for fracture or focal lesion. Sinuses/Orbits: No acute finding. Other: None CT CERVICAL SPINE FINDINGS Alignment: No acute subluxation. Skull base and vertebrae: No acute fracture. Soft tissues and spinal canal: No prevertebral fluid or swelling. No visible canal hematoma. Disc levels:  No acute findings.  No degenerative changes. Upper chest: Negative. Other: Bilateral carotid bulb calcified plaques. IMPRESSION: 1. No acute intracranial pathology. Mild age-related atrophy and chronic microvascular ischemic changes. 2. No acute cervical spine fracture or subluxation. Electronically Signed   By: Anner Crete M.D.   On: 09/01/2021 02:26   CT Cervical Spine Wo Contrast  Result Date: 09/01/2021 CLINICAL DATA:  Trauma. EXAM: CT HEAD WITHOUT CONTRAST CT CERVICAL SPINE WITHOUT CONTRAST TECHNIQUE: Multidetector CT imaging of the head and cervical spine was performed following the standard protocol without intravenous contrast. Multiplanar CT image reconstructions of the cervical spine were also generated. RADIATION DOSE REDUCTION: This exam was performed according to the departmental dose-optimization program which includes automated exposure control, adjustment of the mA and/or kV according to patient size and/or use of iterative reconstruction technique. COMPARISON:  Brain MRI dated 03/24/2021. FINDINGS: CT HEAD FINDINGS Brain: Mild age-related atrophy and chronic microvascular ischemic changes. There is no acute intracranial hemorrhage. No mass effect or midline shift. No extra-axial fluid collection. Vascular: No hyperdense vessel or unexpected calcification. Skull: Normal. Negative for fracture or focal lesion. Sinuses/Orbits: No acute finding. Other: None CT CERVICAL SPINE FINDINGS Alignment: No acute subluxation. Skull base and  vertebrae: No acute fracture. Soft tissues and spinal canal: No prevertebral fluid  or swelling. No visible canal hematoma. Disc levels:  No acute findings.  No degenerative changes. Upper chest: Negative. Other: Bilateral carotid bulb calcified plaques. IMPRESSION: 1. No acute intracranial pathology. Mild age-related atrophy and chronic microvascular ischemic changes. 2. No acute cervical spine fracture or subluxation. Electronically Signed   By: Anner Crete M.D.   On: 09/01/2021 02:26   DG Wrist Complete Right  Result Date: 08/31/2021 CLINICAL DATA:  Wrist pain EXAM: RIGHT WRIST - COMPLETE 3+ VIEW COMPARISON:  None Available. FINDINGS: No fracture or malalignment. Chondrocalcinosis. Mild radiocarpal degenerative change. Moderate arthritis at the first [facility] joint and STT interval. IMPRESSION: Moderate wrist arthritis.  Chondrocalcinosis Electronically Signed   By: Donavan Foil M.D.   On: 08/31/2021 23:58   DG Hand 2 View Right  Result Date: 08/31/2021 CLINICAL DATA:  Hand pain EXAM: RIGHT HAND - 2 VIEW COMPARISON:  None Available. FINDINGS: No fracture or malalignment. Joint space narrowing and degenerative change at the first IP joint, the second through fifth D IP and PIP joints, and the second and third MCP joints. Moderate arthritis at the first [facility] joint and STT interval. Mild joint space narrowing at the radiocarpal interval with cartilaginous calcifications. IMPRESSION: 1. No acute osseous abnormality 2. 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 Electronically Signed   By: Donavan Foil M.D.   On: 08/31/2021 23:57   DG Chest Portable 1 View  Result Date: 08/31/2021 CLINICAL DATA:  Syncope EXAM: PORTABLE CHEST 1 VIEW COMPARISON:  08/30/2014 FINDINGS: The heart size and mediastinal contours are within normal limits. Aortic atherosclerosis. Both lungs are clear. The visualized skeletal structures are unremarkable. IMPRESSION: No active disease. Electronically Signed   By: Donavan Foil  M.D.   On: 08/31/2021 23:55    Assessment/Plan 1. Orthostatic hypotension Will discontinue Aricept for now Continue to monitor SBP drops to 90s when she stands up  2. Acute depression Seemed little better on Wellbutrin and Abilify Also on Zoloft No Change for now  3. 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

## 2021-09-22 DIAGNOSIS — R55 Syncope and collapse: Secondary | ICD-10-CM | POA: Diagnosis not present

## 2021-09-22 DIAGNOSIS — R2681 Unsteadiness on feet: Secondary | ICD-10-CM | POA: Diagnosis not present

## 2021-09-22 DIAGNOSIS — F4321 Adjustment disorder with depressed mood: Secondary | ICD-10-CM | POA: Diagnosis not present

## 2021-09-22 DIAGNOSIS — R41841 Cognitive communication deficit: Secondary | ICD-10-CM | POA: Diagnosis not present

## 2021-09-22 DIAGNOSIS — R278 Other lack of coordination: Secondary | ICD-10-CM | POA: Diagnosis not present

## 2021-09-22 DIAGNOSIS — R2689 Other abnormalities of gait and mobility: Secondary | ICD-10-CM | POA: Diagnosis not present

## 2021-09-22 DIAGNOSIS — M6389 Disorders of muscle in diseases classified elsewhere, multiple sites: Secondary | ICD-10-CM | POA: Diagnosis not present

## 2021-09-22 DIAGNOSIS — G3184 Mild cognitive impairment, so stated: Secondary | ICD-10-CM | POA: Diagnosis not present

## 2021-09-23 ENCOUNTER — Non-Acute Institutional Stay: Payer: Medicare PPO | Admitting: Internal Medicine

## 2021-09-23 ENCOUNTER — Encounter: Payer: Self-pay | Admitting: Internal Medicine

## 2021-09-23 VITALS — BP 134/86 | HR 84 | Temp 96.3°F | Ht 65.0 in | Wt 153.0 lb

## 2021-09-23 DIAGNOSIS — G301 Alzheimer's disease with late onset: Secondary | ICD-10-CM | POA: Diagnosis not present

## 2021-09-23 DIAGNOSIS — F32A Depression, unspecified: Secondary | ICD-10-CM | POA: Diagnosis not present

## 2021-09-23 DIAGNOSIS — F02B3 Dementia in other diseases classified elsewhere, moderate, with mood disturbance: Secondary | ICD-10-CM

## 2021-09-23 DIAGNOSIS — E118 Type 2 diabetes mellitus with unspecified complications: Secondary | ICD-10-CM

## 2021-09-23 DIAGNOSIS — I951 Orthostatic hypotension: Secondary | ICD-10-CM | POA: Diagnosis not present

## 2021-09-23 DIAGNOSIS — E782 Mixed hyperlipidemia: Secondary | ICD-10-CM

## 2021-09-23 DIAGNOSIS — F5101 Primary insomnia: Secondary | ICD-10-CM

## 2021-09-25 ENCOUNTER — Encounter: Payer: Self-pay | Admitting: Internal Medicine

## 2021-09-25 NOTE — Progress Notes (Signed)
Location: [facility] Service:  Clinic (12)  Provider:   Code Status:  Goals of Care:     09/14/2021   10:35 AM  Advanced Directives  Does Patient Have a Medical Advance Directive? Yes  Type of Paramedic of [place];Living will  Does patient want to make changes to medical advance directive? No - Patient declined  Copy of [place] in Chart? Yes - validated most recent copy scanned in chart (See row information)     Chief Complaint  Patient presents with   Medical Management of Chronic Issues    Medical Management of Chronic Issues. 6 week follow up and wants to discuss medications.     HPI: Patient is a 81 y.o. female seen today for an acute visit for follow-up.  Patient lives in [place] in [place]  She has h/o HLD Did not tolerate Statin H/o Breast Cancer s/p Right Mastectomy S/P Right hip Arthroplasty  in 9/21 H/o Intraventricular Hemorrhage in 10/16 after a fall Diabetes Type 2  Depression Insomnia  Dementia Alzheimer's dementia last MMSE 25 out of 30  Recent diagnosis of orthostatic hypotension with falls and dizziness. Acute depression  -Aricept was discontinued due to orthostatic hypotension and dizziness.  She still continues to have few orthostatic blood pressures but much better. She also was started on Wellbutrin which was added to her Zoloft for her acute depression.  Also started on Abilify for paranoid behavior .  Today patient came with her 2 daughters.  They she looked much more awake more interactive but per daughter she is still not sleeping well at night.  And sleeping more during the daytime.  Her paranoid behavior are better Patient is in [place] and per nurses she is not motivated has to be told many times to get up and dress. 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   Hyperlipidemia 11/30/2018   Knee pain 24/58/0998   Lichen sclerosus 33/82/5053   Low back pain 02/22/2017   Malignant neoplasm of upper-outer quadrant of right female breast 1999   Pain in toe 12/06/2019   Personal history of chemotherapy    Personal history of radiation therapy    Unilateral primary osteoarthritis, 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    Past Surgical History:  Procedure Laterality Date   ABDOMINAL HYSTERECTOMY  1998   fibroid   BREAST LUMPECTOMY Right 1999   CHOLECYSTECTOMY     JOINT REPLACEMENT     L knee replacement   TONSILLECTOMY     TOTAL HIP ARTHROPLASTY Right 10/26/2019   Procedure: RIGHT TOTAL HIP ARTHROPLASTY ANTERIOR APPROACH;  Surgeon: Mcarthur Rossetti, MD;  Location: [facility];  Service: Orthopedics;  Laterality: Right;   WISDOM TOOTH EXTRACTION      No Known Allergies  Outpatient Encounter Medications as of 09/23/2021  Medication Sig   acetaminophen (  TYLENOL) 325 MG tablet Take 650 mg by mouth every 4 (four) hours as needed.   ARIPiprazole (ABILIFY) 2 MG tablet Take 2 mg by mouth daily.   buPROPion (WELLBUTRIN XL) 150 MG 24 hr tablet Take 150 mg by mouth daily.   clobetasol ointment (TEMOVATE) 8.52 % Apply 1 Application topically 2 (two) times daily.   metFORMIN (GLUCOPHAGE) 500 MG tablet Take 500 mg by mouth 2 (two) times daily.   sertraline (ZOLOFT) 50 MG tablet Take 25 mg by mouth at bedtime.   [DISCONTINUED] donepezil (ARICEPT) 10 MG tablet Take 1 tablet (10 mg total) by mouth at bedtime.    [DISCONTINUED] melatonin 1 MG TABS tablet Take 1 mg by mouth at bedtime.   No facility-administered encounter medications on file as of 09/23/2021.    Review of Systems:  Review of Systems  Constitutional:  Positive for activity change and unexpected weight change. Negative for appetite change.  HENT: Negative.    Respiratory:  Negative for cough and shortness of breath.   Cardiovascular:  Negative for leg swelling.  Gastrointestinal:  Negative for constipation.  Genitourinary: Negative.   Musculoskeletal:  Negative for arthralgias, gait problem and myalgias.  Skin: Negative.   Neurological:  Negative for dizziness and weakness.  Psychiatric/Behavioral:  Positive for behavioral problems, confusion, dysphoric mood and sleep disturbance. The patient is nervous/anxious.     Health Maintenance  Topic Date Due   FOOT EXAM  Never done   OPHTHALMOLOGY EXAM  Never done   URINE MICROALBUMIN  Never done   Hepatitis C Screening  Never done   TETANUS/TDAP  Never done   Zoster Vaccines- Shingrix (1 of 2) Never done   Pneumonia Vaccine 36+ Years old (1 - PCV) Never done   DEXA SCAN  Never done   COVID-19 Vaccine (3 - Moderna risk series) 01/29/2020   INFLUENZA VACCINE  09/15/2021   HEMOGLOBIN A1C  01/26/2022   HPV VACCINES  Aged Out    Physical Exam: Vitals:   09/23/21 0927  BP: 134/86  Pulse: 84  Temp: (!) 96.3 F (35.7 C)  TempSrc: Skin  SpO2: 96%  Weight: 153 lb (69.4 kg)  Height: '5\' 5"'$  (1.651 m)   Body mass index is 25.46 kg/m. Physical Exam Vitals reviewed.  Constitutional:      Appearance: Normal appearance.  HENT:     Head: Normocephalic.     Nose: Nose normal.     Mouth/Throat:     Mouth: Mucous membranes are moist.     Pharynx: Oropharynx is clear.  Eyes:     Pupils: Pupils are equal, round, and reactive to light.  Cardiovascular:     Rate and Rhythm: Normal rate and regular rhythm.     Pulses: Normal pulses.     Heart sounds: Normal heart sounds. No murmur  heard. Pulmonary:     Effort: Pulmonary effort is normal.     Breath sounds: Normal breath sounds.  Abdominal:     General: Abdomen is flat. Bowel sounds are normal.     Palpations: Abdomen is soft.  Musculoskeletal:        General: No swelling.     Cervical back: Neck supple.  Skin:    General: Skin is warm.  Neurological:     General: No focal deficit present.     Mental Status: She is alert.  Psychiatric:        Mood and Affect: Mood normal.        Thought Content: Thought content normal.  Labs reviewed: Basic Metabolic Panel: Recent Labs    11/04/20 0000 04/30/21 0000 08/31/21 2312 09/01/21 0813 09/02/21 0042 09/08/21 0000  NA 142   < > 130* 134* 132* 137  K 4.2   < > 3.6 3.5 3.4* 4.1  CL 104   < > 94* 98 98 101  CO2 26*   < > '25 24 25 21  '$ GLUCOSE  --   --  167* 160* 118*  --   BUN 11   < > 6* 7* 5* 8  CREATININE 0.7   < > 0.85 0.71 0.63 0.6  CALCIUM 9.4   < > 8.8* 9.1 8.9 9.0  TSH 2.18  --  2.192  --   --   --    < > = values in this interval not displayed.   Liver Function Tests: Recent Labs    04/30/21 0000 08/31/21 2312 09/02/21 0042  AST 31 33 25  ALT 37* 45* 35  ALKPHOS 64 59 55  BILITOT  --  0.2* 0.9  PROT  --  6.9 6.2*  ALBUMIN 4.4 3.8 3.5   No results for input(s): "LIPASE", "AMYLASE" in the last 8760 hours. No results for input(s): "AMMONIA" in the last 8760 hours. CBC: Recent Labs    08/31/21 2312 09/01/21 0631 09/02/21 0245 09/08/21 0000  WBC 12.6* 12.9* 8.5 8.3  NEUTROABS 10.0* 10.1*  --   --   HGB 12.5 14.4 11.9* 13.6  HCT 36.8 40.9 34.1* 39  MCV 90.2 88.3 88.6  --   PLT 238 226 194 138*   Lipid Panel: Recent Labs    11/04/20 0000 04/30/21 0000  CHOL 232* 245*  HDL 62 74*  LDLCALC 141 142  TRIG 149 144   Lab Results  Component Value Date   HGBA1C 8.0 07/27/2021    Procedures since last visit: CT Head Wo Contrast  Result Date: 09/01/2021 CLINICAL DATA:  Trauma. EXAM: CT HEAD WITHOUT CONTRAST CT CERVICAL SPINE  WITHOUT CONTRAST TECHNIQUE: Multidetector CT imaging of the head and cervical spine was performed following the standard protocol without intravenous contrast. Multiplanar CT image reconstructions of the cervical spine were also generated. RADIATION DOSE REDUCTION: This exam was performed according to the departmental dose-optimization program which includes automated exposure control, adjustment of the mA and/or kV according to patient size and/or use of iterative reconstruction technique. COMPARISON:  Brain MRI dated 03/24/2021. FINDINGS: CT HEAD FINDINGS Brain: Mild age-related atrophy and chronic microvascular ischemic changes. There is no acute intracranial hemorrhage. No mass effect or midline shift. No extra-axial fluid collection. Vascular: No hyperdense vessel or unexpected calcification. Skull: Normal. Negative for fracture or focal lesion. Sinuses/Orbits: No acute finding. Other: None CT CERVICAL SPINE FINDINGS Alignment: No acute subluxation. Skull base and vertebrae: No acute fracture. Soft tissues and spinal canal: No prevertebral fluid or swelling. No visible canal hematoma. Disc levels:  No acute findings.  No degenerative changes. Upper chest: Negative. Other: Bilateral carotid bulb calcified plaques. IMPRESSION: 1. No acute intracranial pathology. Mild age-related atrophy and chronic microvascular ischemic changes. 2. No acute cervical spine fracture or subluxation. Electronically Signed   By: Anner Crete M.D.   On: 09/01/2021 02:26   CT Cervical Spine Wo Contrast  Result Date: 09/01/2021 CLINICAL DATA:  Trauma. EXAM: CT HEAD WITHOUT CONTRAST CT CERVICAL SPINE WITHOUT CONTRAST TECHNIQUE: Multidetector CT imaging of the head and cervical spine was performed following the standard protocol without intravenous contrast. Multiplanar CT image reconstructions of the cervical spine were also generated.  RADIATION DOSE REDUCTION: This exam was performed according to the departmental  dose-optimization program which includes automated exposure control, adjustment of the mA and/or kV according to patient size and/or use of iterative reconstruction technique. COMPARISON:  Brain MRI dated 03/24/2021. FINDINGS: CT HEAD FINDINGS Brain: Mild age-related atrophy and chronic microvascular ischemic changes. There is no acute intracranial hemorrhage. No mass effect or midline shift. No extra-axial fluid collection. Vascular: No hyperdense vessel or unexpected calcification. Skull: Normal. Negative for fracture or focal lesion. Sinuses/Orbits: No acute finding. Other: None CT CERVICAL SPINE FINDINGS Alignment: No acute subluxation. Skull base and vertebrae: No acute fracture. Soft tissues and spinal canal: No prevertebral fluid or swelling. No visible canal hematoma. Disc levels:  No acute findings.  No degenerative changes. Upper chest: Negative. Other: Bilateral carotid bulb calcified plaques. IMPRESSION: 1. No acute intracranial pathology. Mild age-related atrophy and chronic microvascular ischemic changes. 2. No acute cervical spine fracture or subluxation. Electronically Signed   By: Anner Crete M.D.   On: 09/01/2021 02:26   DG Wrist Complete Right  Result Date: 08/31/2021 CLINICAL DATA:  Wrist pain EXAM: RIGHT WRIST - COMPLETE 3+ VIEW COMPARISON:  None Available. FINDINGS: No fracture or malalignment. Chondrocalcinosis. Mild radiocarpal degenerative change. Moderate arthritis at the first [facility] joint and STT interval. IMPRESSION: Moderate wrist arthritis.  Chondrocalcinosis Electronically Signed   By: Donavan Foil M.D.   On: 08/31/2021 23:58   DG Hand 2 View Right  Result Date: 08/31/2021 CLINICAL DATA:  Hand pain EXAM: RIGHT HAND - 2 VIEW COMPARISON:  None Available. FINDINGS: No fracture or malalignment. Joint space narrowing and degenerative change at the first IP joint, the second through fifth D IP and PIP joints, and the second and third MCP joints. Moderate arthritis at the first  [facility] joint and STT interval. Mild joint space narrowing at the radiocarpal interval with cartilaginous calcifications. IMPRESSION: 1. No acute osseous abnormality 2. 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 Electronically Signed   By: Donavan Foil M.D.   On: 08/31/2021 23:57   DG Chest Portable 1 View  Result Date: 08/31/2021 CLINICAL DATA:  Syncope EXAM: PORTABLE CHEST 1 VIEW COMPARISON:  08/30/2014 FINDINGS: The heart size and mediastinal contours are within normal limits. Aortic atherosclerosis. Both lungs are clear. The visualized skeletal structures are unremarkable. IMPRESSION: No active disease. Electronically Signed   By: Donavan Foil M.D.   On: 08/31/2021 23:55    Assessment/Plan 1. Orthostatic hypotension Was not Orthostatic in clinic Will continue to monitor in the [place] Off her Aricept right now  2. Acute depression Mood seems better On Wellbutrin,Abilify and Zoloft Will wait till next week to change the dose for Wellbutrin  3. 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 Controlled type 2 diabetes mellitus with complication, without long-term current use of insulin (HCC) Continue Metformin Patient had few episodes of Diarrhea recently Off Aricept but if continues have to reduce dose of Metformin  5. Mixed hyperlipidemia Did not tolerate statin  6. Primary insomnia Restart Melatonin at 2 mg  Can add Remeron also if melatonin does nto help Will Reiew next week    Labs/tests ordered:  * No order type specified * Next appt:  Visit date not found

## 2021-09-28 ENCOUNTER — Emergency Department (HOSPITAL_COMMUNITY)
Admission: EM | Admit: 2021-09-28 | Discharge: 2021-09-28 | Disposition: A | Discharge status: Home / Self Care | Payer: Medicare PPO | Attending: Student | Admitting: Student

## 2021-09-28 ENCOUNTER — Other Ambulatory Visit: Payer: Self-pay

## 2021-09-28 ENCOUNTER — Encounter (HOSPITAL_COMMUNITY): Payer: Self-pay

## 2021-09-28 ENCOUNTER — Telehealth: Payer: Self-pay | Admitting: *Deleted

## 2021-09-28 DIAGNOSIS — Z96641 Presence of right artificial hip joint: Secondary | ICD-10-CM | POA: Diagnosis not present

## 2021-09-28 DIAGNOSIS — Z853 Personal history of malignant neoplasm of breast: Secondary | ICD-10-CM | POA: Insufficient documentation

## 2021-09-28 DIAGNOSIS — I951 Orthostatic hypotension: Secondary | ICD-10-CM | POA: Diagnosis not present

## 2021-09-28 DIAGNOSIS — Z7984 Long term (current) use of oral hypoglycemic drugs: Secondary | ICD-10-CM | POA: Diagnosis not present

## 2021-09-28 DIAGNOSIS — R739 Hyperglycemia, unspecified: Secondary | ICD-10-CM | POA: Diagnosis not present

## 2021-09-28 DIAGNOSIS — F03918 Unspecified dementia, unspecified severity, with other behavioral disturbance: Secondary | ICD-10-CM | POA: Insufficient documentation

## 2021-09-28 DIAGNOSIS — R531 Weakness: Secondary | ICD-10-CM | POA: Diagnosis not present

## 2021-09-28 DIAGNOSIS — E876 Hypokalemia: Secondary | ICD-10-CM | POA: Diagnosis not present

## 2021-09-28 DIAGNOSIS — E119 Type 2 diabetes mellitus without complications: Secondary | ICD-10-CM | POA: Diagnosis not present

## 2021-09-28 DIAGNOSIS — F4321 Adjustment disorder with depressed mood: Secondary | ICD-10-CM | POA: Diagnosis not present

## 2021-09-28 DIAGNOSIS — Z96652 Presence of left artificial knee joint: Secondary | ICD-10-CM | POA: Insufficient documentation

## 2021-09-28 DIAGNOSIS — R41841 Cognitive communication deficit: Secondary | ICD-10-CM | POA: Diagnosis not present

## 2021-09-28 DIAGNOSIS — R55 Syncope and collapse: Secondary | ICD-10-CM | POA: Diagnosis not present

## 2021-09-28 LAB — BASIC METABOLIC PANEL
Anion gap: 5 (ref 5–15)
BUN: 9 mg/dL (ref 8–23)
CO2: 22 mmol/L (ref 22–32)
Calcium: 7.4 mg/dL — ABNORMAL LOW (ref 8.9–10.3)
Chloride: 109 mmol/L (ref 98–111)
Creatinine, Ser: 0.59 mg/dL (ref 0.44–1.00)
GFR, Estimated: >60 mL/min (ref >60)
Glucose, Bld: 118 mg/dL — ABNORMAL HIGH (ref 70–99)
Potassium: 2.8 mmol/L — ABNORMAL LOW (ref 3.5–5.1)
Sodium: 136 mmol/L (ref 135–145)

## 2021-09-28 LAB — URINALYSIS, ROUTINE W REFLEX MICROSCOPIC
Bilirubin Urine: NEGATIVE
Glucose, UA: NEGATIVE mg/dL
Ketones, ur: NEGATIVE mg/dL
Nitrite: NEGATIVE
Protein, ur: NEGATIVE mg/dL
Specific Gravity, Urine: 1.008 (ref 1.005–1.030)
pH: 7 (ref 5.0–8.0)

## 2021-09-28 LAB — CBC
HCT: 39.5 % (ref 36.0–46.0)
Hemoglobin: 13.3 g/dL (ref 12.0–15.0)
MCH: 30.8 pg (ref 26.0–34.0)
MCHC: 33.7 g/dL (ref 30.0–36.0)
MCV: 91.4 fL (ref 80.0–100.0)
Platelets: 224 10*3/uL (ref 150–400)
RBC: 4.32 MIL/uL (ref 3.87–5.11)
RDW: 12.7 % (ref 11.5–15.5)
WBC: 8.1 10*3/uL (ref 4.0–10.5)
nRBC: 0 % (ref 0.0–0.2)

## 2021-09-28 MED ORDER — LACTATED RINGERS IV BOLUS
1000.0000 mL | Freq: Once | INTRAVENOUS | Status: AC
  Administered 2021-09-28: 1000 mL via INTRAVENOUS

## 2021-09-28 MED ORDER — POTASSIUM CHLORIDE CRYS ER 20 MEQ PO TBCR
40.0000 meq | EXTENDED_RELEASE_TABLET | Freq: Once | ORAL | Status: AC
  Administered 2021-09-28: 40 meq via ORAL
  Filled 2021-09-28: qty 2

## 2021-09-28 MED ORDER — MAGNESIUM OXIDE -MG SUPPLEMENT 400 (240 MG) MG PO TABS
800.0000 mg | ORAL_TABLET | Freq: Once | ORAL | Status: AC
  Administered 2021-09-28: 800 mg via ORAL
  Filled 2021-09-28: qty 2

## 2021-09-28 NOTE — ED Triage Notes (Addendum)
Pt arrived via EMS from [place] with c/o syncopal episode today. Hx of same. No trauma noted, patient was assisted into chair at time of syncopal episode so no fall. Pt states that she does not remember having a syncopal episode. Positive orthostatic vital signs with EMS. Pt states that she is feeling a lot better now.

## 2021-09-28 NOTE — Telephone Encounter (Signed)
[name], daughter called and stated that patient is at [facility] and they are sending her to [facility].   Stated that patient had episodes of passing out a while back and it happened again today. Daughter is thinking the Blood pressure is dropping too low and thinks it is patient's medications.   Daughter is Requesting to speak with Dr. Lyndel Safe Directly regarding patient.   Requesting you to call her at [phone]

## 2021-09-29 ENCOUNTER — Non-Acute Institutional Stay: Payer: Medicare PPO | Admitting: Orthopedic Surgery

## 2021-09-29 ENCOUNTER — Encounter: Payer: Self-pay | Admitting: Orthopedic Surgery

## 2021-09-29 DIAGNOSIS — E876 Hypokalemia: Secondary | ICD-10-CM | POA: Diagnosis not present

## 2021-09-29 DIAGNOSIS — G301 Alzheimer's disease with late onset: Secondary | ICD-10-CM | POA: Diagnosis not present

## 2021-09-29 DIAGNOSIS — I951 Orthostatic hypotension: Secondary | ICD-10-CM

## 2021-09-29 DIAGNOSIS — R630 Anorexia: Secondary | ICD-10-CM

## 2021-09-29 DIAGNOSIS — E118 Type 2 diabetes mellitus with unspecified complications: Secondary | ICD-10-CM | POA: Diagnosis not present

## 2021-09-29 DIAGNOSIS — F02B3 Dementia in other diseases classified elsewhere, moderate, with mood disturbance: Secondary | ICD-10-CM

## 2021-09-29 DIAGNOSIS — F32A Depression, unspecified: Secondary | ICD-10-CM

## 2021-09-29 MED ORDER — METFORMIN HCL 500 MG PO TABS: 250.0000 mg | ORAL_TABLET | Freq: Two times a day (BID) | ORAL | 3 refills | Status: DC

## 2021-09-29 NOTE — ED Provider Notes (Signed)
[facility] DEPT Provider Note  CSN: [id number] Arrival date & time: 09/28/21 1545  Chief Complaint(s) Loss of Consciousness  HPI Jodi Summers is a 81 y.o. female who presents to the emergency department for evaluation of a syncopal event.  Patient has recently been restarted on her metformin, Abilify and doses have been changed of her SSRI and daughter states that she has had increased sleepiness, mild increase in loose stools and decreased p.o. intake.  Patient has an extensive history of fairly dramatic orthostatic syncopal events and today she was going from sitting to standing in her wheelchair, slumped back in a wheelchair and had an episode of diaphoresis and syncope.  I personally evaluated this patient when she had a similar event at [facility] on 09/01/2021.  Patient was given fluid resuscitation by EMS and on arrival, she is alert and oriented answering all questions appropriately.  She was orthostatic on arrival here with systolics in the 56O.  Denies chest pain, shortness of breath, abdominal pain, nausea, vomiting or other systemic symptoms.   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   Hyperlipidemia 11/30/2018   Knee pain 13/09/6576   Lichen sclerosus 46/96/2952   Low back pain 02/22/2017   Malignant neoplasm of upper-outer quadrant of right female breast 1999   Pain in toe 12/06/2019   Personal history of chemotherapy    Personal history of radiation therapy    Unilateral primary osteoarthritis, 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   Patient Active Problem List   Diagnosis Date Noted   Fall 09/01/2021    Dementia without behavioral disturbance ([place]) 09/01/2021   Orthostatic hypotension 09/01/2021   Syncope and collapse 09/01/2021   Lactic acidosis 09/01/2021   Leukocytosis 09/01/2021   Right wrist sprain 09/01/2021   Hyponatremia 09/01/2021   Elevated ALT measurement 09/01/2021   Controlled type 2 diabetes mellitus with complication, without long-term current use of insulin ([place]) 07/20/2021   Amnestic MCI (mild cognitive impairment with memory loss) 84/13/2440   Lichen sclerosus 12/12/2534   History of hysterectomy 03/18/2021   Malignant neoplasm of upper-outer quadrant of right female breast 03/18/2021   Blepharitis 12/06/2019   Pain in toe 12/06/2019   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 Authorizing Provider  acetaminophen (TYLENOL) 325 MG tablet Take 650 mg by mouth every 4 (four) hours as needed.    [provider]  ARIPiprazole (ABILIFY) 2 MG tablet Take 2 mg by mouth daily.    [provider]  buPROPion (WELLBUTRIN XL) 150 MG 24 hr tablet Take 150 mg by mouth daily.    [provider]  clobetasol ointment (TEMOVATE) 6.44 % Apply 1 Application topically 2 (two) times daily.    [provider]  metFORMIN (GLUCOPHAGE) 500 MG tablet Take 500 mg by mouth 2 (two) times daily.    [provider]  sertraline (ZOLOFT)  50 MG tablet Take 25 mg by mouth at bedtime.    [provider]                                                                                                                                    Past Surgical History Past Surgical History:  Procedure Laterality Date   ABDOMINAL HYSTERECTOMY  1998    fibroid   BREAST LUMPECTOMY Right 1999   CHOLECYSTECTOMY     JOINT REPLACEMENT     L knee replacement   TONSILLECTOMY     TOTAL HIP ARTHROPLASTY Right 10/26/2019   Procedure: RIGHT TOTAL HIP ARTHROPLASTY ANTERIOR APPROACH;  Surgeon: Mcarthur Rossetti, MD;  Location: [facility];  Service: Orthopedics;  Laterality: Right;   WISDOM TOOTH EXTRACTION     Family History Family History  Problem Relation Age of Onset   Alzheimer's disease Mother    Alzheimer's disease Sister     Social History Social History   Tobacco Use   Smoking status: Never   Smokeless tobacco: Never  Vaping Use   Vaping Use: Never used  Substance Use Topics   Alcohol use: Not Currently    Comment: occ   Drug use: Never   Allergies Patient has no known allergies.  Review of Systems Review of Systems  Neurological:  Positive for syncope.    Physical Exam Vital Signs  I have reviewed the triage vital signs BP (!) 149/71   Pulse 88   Temp 98 F (36.7 C) (Oral)   Resp 19   Ht '5\' 5"'$  (1.651 m)   Wt 72.6 kg   SpO2 97%   BMI 26.63 kg/m   Physical Exam Vitals and nursing note reviewed.  Constitutional:      General: She is not in acute distress.    Appearance: She is well-developed.  HENT:     Head: Normocephalic and atraumatic.  Eyes:     Conjunctiva/sclera: Conjunctivae normal.  Cardiovascular:     Rate and Rhythm: Normal rate and regular rhythm.     Heart sounds: No murmur heard. Pulmonary:     Effort: Pulmonary effort is normal. No respiratory distress.     Breath sounds: Normal breath sounds.  Abdominal:     Palpations: Abdomen is soft.     Tenderness: There is no abdominal tenderness.  Musculoskeletal:        General: No swelling.     Cervical back: Neck supple.  Skin:    General: Skin is warm and dry.     Capillary Refill: Capillary refill takes less than 2 seconds.  Neurological:     Mental Status: She is alert.  Psychiatric:        Mood and Affect: Mood normal.     ED  Results and Treatments Labs (all labs ordered are listed, but only abnormal results are displayed) Labs Reviewed  BASIC METABOLIC PANEL - Abnormal; Notable for the following components:  Result Value   Potassium 2.8 (*)    Glucose, Bld 118 (*)    Calcium 7.4 (*)    All other components within normal limits  URINALYSIS, ROUTINE W REFLEX MICROSCOPIC - Abnormal; Notable for the following components:   Hgb urine dipstick SMALL (*)    Leukocytes,Ua SMALL (*)    Bacteria, UA RARE (*)    All other components within normal limits  CBC                                                                                                                          Radiology No results found.  Pertinent labs & imaging results that were available during my care of the patient were reviewed by me and considered in my medical decision making (see MDM for details).  Medications Ordered in ED Medications  lactated ringers bolus 1,000 mL (0 mLs Intravenous Stopped 09/28/21 2047)  potassium chloride SA (KLOR-CON M) CR tablet 40 mEq (40 mEq Oral Given 09/28/21 1759)  magnesium oxide (MAG-OX) tablet 800 mg (800 mg Oral Given 09/28/21 1759)                                                                                                                                     Procedures .Critical Care  Performed by: Teressa Lower, MD Authorized by: Teressa Lower, MD   Critical care provider statement:    Critical care time (minutes):  30   Critical care was necessary to treat or prevent imminent or life-threatening deterioration of the following conditions:  Dehydration   Critical care was time spent personally by me on the following activities:  Development of treatment plan with patient or surrogate, discussions with consultants, evaluation of patient's response to treatment, examination of patient, ordering and review of laboratory studies, ordering and review of radiographic studies, ordering and  performing treatments and interventions, pulse oximetry, re-evaluation of patient's condition and review of old charts   (including critical care time)  Medical Decision Making / ED Course   This patient presents to the ED for concern of syncope, this involves an extensive number of treatment options, and is a complaint that carries with it a high risk of complications and morbidity.  The differential diagnosis includes orthostatic syncope, vasovagal syncope, medication side effect, cardiogenic syncope  MDM: Patient seen in the emergency room for evaluation of syncope.  Physical exam is unremarkable  laboratory evaluation with mild hypokalemia to 2.8 which was repleted in the emergency department, urinalysis unremarkable, ECG with a mild prolonged PR interval but no bradycardia and no other overt evidence of a first-degree heart block.  Patient was given fluid resuscitation here in the emergency department and orthostatic vital signs were repeated.  On repeat, patient had no persistent orthostasis and was overall clinically proved.  Suspect multifactorial cause of her orthostatic syncope and the patient and her daughter sent a message to the patient's primary care physician Dr. Lyndel Safe for med rec to determine if any of her home medications may be contributing to her orthostasis.  Patient does not meet inpatient criteria for admission is safe for discharge with outpatient follow-up.   Additional history obtained: -Additional history obtained from daughter -External records from outside source obtained and reviewed including: Chart review including previous notes, labs, imaging, consultation notes   Lab Tests: -I ordered, reviewed, and interpreted labs.   The pertinent results include:   Labs Reviewed  BASIC METABOLIC PANEL - Abnormal; Notable for the following components:      Result Value   Potassium 2.8 (*)    Glucose, Bld 118 (*)    Calcium 7.4 (*)    All other components within normal  limits  URINALYSIS, ROUTINE W REFLEX MICROSCOPIC - Abnormal; Notable for the following components:   Hgb urine dipstick SMALL (*)    Leukocytes,Ua SMALL (*)    Bacteria, UA RARE (*)    All other components within normal limits  CBC      EKG   EKG Interpretation  Date/Time:  Monday September 28 2021 15:56:33 EDT Ventricular Rate:  65 PR Interval:  240 QRS Duration: 78 QT Interval:  395 QTC Calculation: 411 R Axis:   17 Text Interpretation: Sinus rhythm Prolonged PR interval Confirmed by Diana Armijo ([id number]) on 09/29/2021 1:31:36 AM         Medicines ordered and prescription drug management: Meds ordered this encounter  Medications   lactated ringers bolus 1,000 mL   potassium chloride SA (KLOR-CON M) CR tablet 40 mEq   magnesium oxide (MAG-OX) tablet 800 mg    -I have reviewed the patients home medicines and have made adjustments as needed  Critical interventions IVF, electrolyte repletion   Cardiac Monitoring: The patient was maintained on a cardiac monitor.  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   Hyperlipidemia 11/30/2018   Knee pain 01/75/1025   Lichen sclerosus 85/27/7824   Low back pain 02/22/2017   Malignant neoplasm of upper-outer quadrant of right female breast 1999   Pain  in toe 12/06/2019   Personal history of chemotherapy    Personal history of radiation therapy    Unilateral primary osteoarthritis, 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      Dispostion: I considered admission for this patient, but she currently does not meet inpatient  criteria for admission is safe for discharge with outpatient follow-up     Final Clinical Impression(s) / ED Diagnoses Final diagnoses:  Orthostatic syncope     '@PCDICTATION'$ @    Keia Rask, Debe Coder, MD 09/29/21 [id number]

## 2021-09-29 NOTE — Progress Notes (Addendum)
Careteam: Patient Care Team: [name], MD as PCP - General (Internal Medicine)  Seen by: Windell Moulding, AGNP-C  PLACE OF SERVICE:  [facility]- assisted living Advanced Directive information Does Patient Have a Medical Advance Directive?: Yes, Type of Advance Directive: [place];Living will, Does patient want to make changes to medical advance directive?: No - Patient declined  No Known Allergies  Chief Complaint  Patient presents with   Follow-up    ED follow up orthostatic hypotension.     HPI: Patient is a 81 y.o. female seen today s/p ED visit 08/14 due to orthostatic hypotension.   She currently resides on the assisted living unit at [facility] PMH: hypotension, T2DM, amnestic MCI, AD, OA, HLD, breast cancer, and unstable gait.   Recently moved back to [place] from rehab. She was noted to have seizure like activity in [place], CT head showed no acute injury. Aricept discontinued, thought to be contributing to orthostatic hypotension. Currently taking Abilify and Wellbutrin for paranoid behavior and acute depression. Noted staying in bed more, lack of motivation. Also on Zoloft- reduced dose due to hypotension. Metformin started 07/2021, A1c 8.0. Reports some diarrhea since starting medication.   08/14 she had a syncopal episode in front of nursing station. She was sent to the ED for evaluation. WBC 8.1, hgb 13.3, platelets 224, K+ 2.8, glucose 118, sodium 136, BUN/creat 9/0.59, calcium 7.4. UA small leukocytes, rare bacteria. EKG indicated mild prolonged PR interval, no bradycardia or evidence of 1st degree heart block. She was given 1000 cc bolus of LR, potassium 40 meq once and magnesium 800 mg once. She was fluid responsive, blood pressure improved after bolus. Dehydration suspected as cause for syncope. Discharged to [facility]   Today, she cannot remember ED visit. Reports feeling tired today. Nursing expressed concerns about overall poor po intake. She does  not eat breakfast, eating < 50% of other meals. Nursing is frequently reminding her to eat and drink. Treatment options discussed with daughter.  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   Hyperlipidemia 11/30/2018   Knee pain 02/63/7858   Lichen sclerosus 85/03/7739   Low back pain 02/22/2017   Malignant neoplasm of upper-outer quadrant of right female breast 1999   Pain in toe 12/06/2019   Personal history of chemotherapy    Personal history of radiation therapy    Unilateral primary osteoarthritis, 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   Past Surgical History:  Procedure Laterality Date   ABDOMINAL HYSTERECTOMY  1998   fibroid   BREAST LUMPECTOMY Right 1999   CHOLECYSTECTOMY     JOINT REPLACEMENT     L knee replacement   TONSILLECTOMY     TOTAL HIP ARTHROPLASTY Right 10/26/2019   Procedure: RIGHT TOTAL HIP ARTHROPLASTY ANTERIOR APPROACH;  Surgeon: Mcarthur Rossetti, MD;  Location: [facility];  Service: Orthopedics;  Laterality: Right;   WISDOM TOOTH EXTRACTION     Social History:   reports that she has never smoked. She has never used smokeless tobacco. She reports that she does not currently use alcohol. She reports that she does  not use drugs.  Family History  Problem Relation Age of Onset   Alzheimer's disease Mother    Alzheimer's disease Sister     Medications: Patient's Medications  New Prescriptions   METFORMIN (GLUCOPHAGE) 500 MG TABLET    Take 0.5 tablets (250 mg total) by mouth 2 (two) times daily with a meal.  Previous Medications   ARIPIPRAZOLE (ABILIFY) 2 MG  TABLET    Take 2 mg by mouth daily.   BUPROPION (WELLBUTRIN XL) 150 MG 24 HR TABLET    Take 150 mg by mouth daily.   CLOBETASOL OINTMENT (TEMOVATE) 0.05 %    Apply 1 Application topically 2 (two) times daily.   SERTRALINE (ZOLOFT) 50 MG TABLET    Take 25 mg by mouth at bedtime.  Modified Medications   No medications on file  Discontinued Medications   ACETAMINOPHEN (TYLENOL) 325 MG TABLET    Take 650 mg by mouth every 4 (four) hours as needed.   METFORMIN (GLUCOPHAGE) 500 MG TABLET    Take 250 mg by mouth 2 (two) times daily.    Physical Exam:  Vitals:   09/29/21 1331  BP: 117/71  Pulse: 98  Resp: 20  Temp: 97.8 F (36.6 C)  SpO2: 97%  Weight: 151 lb 6.4 oz (68.7 kg)  Height: '5\' 5"'$  (1.651 m)   Body mass index is 25.19 kg/m. Wt Readings from Last 3 Encounters:  09/29/21 151 lb 6.4 oz (68.7 kg)  09/28/21 160 lb (72.6 kg)  09/23/21 153 lb (69.4 kg)    Physical Exam Vitals reviewed.  Constitutional:      General: She is not in acute distress. HENT:     Head: Normocephalic and atraumatic.     Right Ear: There is no impacted cerumen.     Left Ear: There is no impacted cerumen.     Nose: Nose normal.     Mouth/Throat:     Mouth: Mucous membranes are moist.  Eyes:     General:        Right eye: No discharge.        Left eye: No discharge.  Cardiovascular:     Rate and Rhythm: Normal rate and regular rhythm.     Pulses: Normal pulses.     Heart sounds: Normal heart sounds.  Pulmonary:     Effort: Pulmonary effort is normal. No respiratory distress.     Breath sounds: Normal breath sounds. No wheezing.  Abdominal:     General: Bowel sounds are normal. There is no distension.     Palpations: Abdomen is soft.     Tenderness: There is no abdominal tenderness.  Musculoskeletal:     Cervical back: Neck supple.     Right lower leg: No edema.     Left lower leg: No edema.  Skin:    General: Skin is warm and dry.     Capillary Refill: Capillary refill takes less than 2  seconds.  Neurological:     General: No focal deficit present.     Mental Status: She is alert. Mental status is at baseline.     Motor: Weakness present.     Gait: Gait abnormal.     Comments: Slow gait  Psychiatric:        Mood and Affect: Mood normal.        Behavior: Behavior normal.     Comments: Very pleasant, follows commands, alert to self/person     Labs reviewed: Basic Metabolic Panel: Recent Labs  11/04/20 0000 04/30/21 0000 08/31/21 2312 09/01/21 0813 09/02/21 0042 09/08/21 0000 09/28/21 1615  NA 142   < > 130* 134* 132* 137 136  K 4.2   < > 3.6 3.5 3.4* 4.1 2.8*  CL 104   < > 94* 98 98 101 109  CO2 26*   < > '25 24 25 21 22  '$ GLUCOSE  --    < > 167* 160* 118*  --  118*  BUN 11   < > 6* 7* 5* 8 9  CREATININE 0.7   < > 0.85 0.71 0.63 0.6 0.59  CALCIUM 9.4   < > 8.8* 9.1 8.9 9.0 7.4*  TSH 2.18  --  2.192  --   --   --   --    < > = values in this interval not displayed.   Liver Function Tests: Recent Labs    04/30/21 0000 08/31/21 2312 09/02/21 0042  AST 31 33 25  ALT 37* 45* 35  ALKPHOS 64 59 55  BILITOT  --  0.2* 0.9  PROT  --  6.9 6.2*  ALBUMIN 4.4 3.8 3.5   No results for input(s): "LIPASE", "AMYLASE" in the last 8760 hours. No results for input(s): "AMMONIA" in the last 8760 hours. CBC: Recent Labs    08/31/21 2312 09/01/21 0631 09/02/21 0245 09/08/21 0000 09/28/21 1615  WBC 12.6* 12.9* 8.5 8.3 8.1  NEUTROABS 10.0* 10.1*  --   --   --   HGB 12.5 14.4 11.9* 13.6 13.3  HCT 36.8 40.9 34.1* 39 39.5  MCV 90.2 88.3 88.6  --  91.4  PLT 238 226 194 138* 224   Lipid Panel: Recent Labs    11/04/20 0000 04/30/21 0000  CHOL 232* 245*  HDL 62 74*  LDLCALC 141 142  TRIG 149 144   TSH: Recent Labs    11/04/20 0000 08/31/21 2312  TSH 2.18 2.192   A1C: Lab Results  Component Value Date   HGBA1C 8.0 07/27/2021     Assessment/Plan 1. Orthostatic hypotension - ongoing - 08/14 ED visit due to syncopal episode- dehydration  suspected cause - given 1000 cc LR and bp improved  - not eating or drinking well per nursing- need to be reminded - Orthostatic blood pressures daily x 3 days  2. Hypokalemia - K+ 2.8, given KCL 40 meq once in ED - start KCL 20 meq po bid x 3 days - cmp 08/21  3. Controlled type 2 diabetes mellitus with complication, without long-term current use of insulin (HCC) - A1c 8.0 07/2021 - glucose 118 08/14 - no hypoglycemic events - intermittent diarrhea reported after starting metformin - will reduce metformin to 250 mg po bid - A1c- 08/21  4. 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 Acute depression - sleeping more, less motivation - cont Wellbutrin and Abilify - zoloft reduced due to hypotension  6. Poor appetite - see above - eating < 50% of lunch and dinner - does not eat breakfast - start ensure daily    Future labs/tests: cmp, magnesium level, A1c  Next appt: Visit date not found  [place], [facility] [phone]

## 2021-09-30 DIAGNOSIS — R55 Syncope and collapse: Secondary | ICD-10-CM | POA: Diagnosis not present

## 2021-09-30 DIAGNOSIS — M6389 Disorders of muscle in diseases classified elsewhere, multiple sites: Secondary | ICD-10-CM | POA: Diagnosis not present

## 2021-09-30 DIAGNOSIS — R278 Other lack of coordination: Secondary | ICD-10-CM | POA: Diagnosis not present

## 2021-09-30 DIAGNOSIS — G3184 Mild cognitive impairment, so stated: Secondary | ICD-10-CM | POA: Diagnosis not present

## 2021-09-30 NOTE — Telephone Encounter (Signed)
Jodi Summers talked to her

## 2021-10-02 ENCOUNTER — Telehealth: Payer: Self-pay | Admitting: *Deleted

## 2021-10-02 NOTE — Telephone Encounter (Signed)
        Patient  visited [facility] on 09/28/2021  for orthostatic blood pressure    Telephone encounter attempt :  1st  A HIPAA compliant voice message was left requesting a return call.  Instructed patient to call back at [phone].  [place] [phone] [address] , [place] [address] Email : [email or web address]

## 2021-10-05 DIAGNOSIS — E119 Type 2 diabetes mellitus without complications: Secondary | ICD-10-CM | POA: Diagnosis not present

## 2021-10-05 LAB — COMPREHENSIVE METABOLIC PANEL
Albumin: 4.2 (ref 3.5–5.0)
Calcium: 9.5 (ref 8.7–10.7)
Globulin: 2.5
eGFR: >90

## 2021-10-05 LAB — HEPATIC FUNCTION PANEL
ALT: 36 U/L — AB (ref 7–35)
AST: 33 (ref 13–35)
Alkaline Phosphatase: 74 (ref 25–125)
Bilirubin, Total: 0.4

## 2021-10-05 LAB — HEMOGLOBIN A1C: Hemoglobin A1C: 6.9

## 2021-10-05 LAB — BASIC METABOLIC PANEL
BUN: 8 (ref 4–21)
CO2: 24 — AB (ref 13–22)
Chloride: 98 — AB (ref 99–108)
Creatinine: 0.7 (ref 0.5–1.1)
Glucose: 130
Potassium: 4.5 mEq/L (ref 3.5–5.1)
Sodium: 133 — AB (ref 137–147)

## 2021-10-06 DIAGNOSIS — M6389 Disorders of muscle in diseases classified elsewhere, multiple sites: Secondary | ICD-10-CM | POA: Diagnosis not present

## 2021-10-06 DIAGNOSIS — G3184 Mild cognitive impairment, so stated: Secondary | ICD-10-CM | POA: Diagnosis not present

## 2021-10-06 DIAGNOSIS — R55 Syncope and collapse: Secondary | ICD-10-CM | POA: Diagnosis not present

## 2021-10-06 DIAGNOSIS — R278 Other lack of coordination: Secondary | ICD-10-CM | POA: Diagnosis not present

## 2021-10-07 ENCOUNTER — Encounter: Payer: Self-pay | Admitting: Internal Medicine

## 2021-10-07 DIAGNOSIS — R55 Syncope and collapse: Secondary | ICD-10-CM | POA: Diagnosis not present

## 2021-10-07 DIAGNOSIS — R278 Other lack of coordination: Secondary | ICD-10-CM | POA: Diagnosis not present

## 2021-10-07 DIAGNOSIS — F4321 Adjustment disorder with depressed mood: Secondary | ICD-10-CM | POA: Diagnosis not present

## 2021-10-07 DIAGNOSIS — G3184 Mild cognitive impairment, so stated: Secondary | ICD-10-CM | POA: Diagnosis not present

## 2021-10-07 DIAGNOSIS — M6389 Disorders of muscle in diseases classified elsewhere, multiple sites: Secondary | ICD-10-CM | POA: Diagnosis not present

## 2021-10-07 DIAGNOSIS — R41841 Cognitive communication deficit: Secondary | ICD-10-CM | POA: Diagnosis not present

## 2021-10-13 ENCOUNTER — Encounter: Payer: Medicare PPO | Admitting: Psychology

## 2021-10-20 ENCOUNTER — Encounter: Payer: Medicare PPO | Admitting: Psychology

## 2021-11-03 ENCOUNTER — Ambulatory Visit: Payer: Medicare PPO | Admitting: Physician Assistant

## 2021-11-11 ENCOUNTER — Encounter: Payer: Medicare PPO | Admitting: Internal Medicine

## 2021-12-01 ENCOUNTER — Encounter: Payer: Self-pay | Admitting: Internal Medicine

## 2021-12-01 ENCOUNTER — Non-Acute Institutional Stay: Payer: Medicare PPO | Admitting: Internal Medicine

## 2021-12-01 VITALS — BP 126/72 | HR 90 | Temp 97.9°F | Resp 18 | Ht 65.0 in | Wt 156.6 lb

## 2021-12-01 DIAGNOSIS — G301 Alzheimer's disease with late onset: Secondary | ICD-10-CM | POA: Diagnosis not present

## 2021-12-01 DIAGNOSIS — E118 Type 2 diabetes mellitus with unspecified complications: Secondary | ICD-10-CM

## 2021-12-01 DIAGNOSIS — F32A Depression, unspecified: Secondary | ICD-10-CM | POA: Diagnosis not present

## 2021-12-01 DIAGNOSIS — F5101 Primary insomnia: Secondary | ICD-10-CM

## 2021-12-01 DIAGNOSIS — I951 Orthostatic hypotension: Secondary | ICD-10-CM | POA: Diagnosis not present

## 2021-12-01 DIAGNOSIS — F02B3 Dementia in other diseases classified elsewhere, moderate, with mood disturbance: Secondary | ICD-10-CM

## 2021-12-01 DIAGNOSIS — E782 Mixed hyperlipidemia: Secondary | ICD-10-CM

## 2021-12-01 DIAGNOSIS — R569 Unspecified convulsions: Secondary | ICD-10-CM

## 2021-12-01 NOTE — Progress Notes (Signed)
Location:  [facility] of Service:  Clinic (12)  Provider:   Code Status:  Goals of Care:     12/01/2021   10:03 AM  Advanced Directives  Does Patient Have a Medical Advance Directive? Yes  Type of Paramedic of [place];Living will;Out of facility DNR (pink MOST or yellow form)  Copy of [place] in Chart? Yes - validated most recent copy scanned in chart (See row information)     Chief Complaint  Patient presents with   Medical Management of Chronic Issues    Medical management of Chronic Issues.    Quality Metric Gaps    To Discuss Foot exam,Ophthalmology,Diabetic Kidney eval.,Hep C screen,Tdap,Zoster,Pneumonia,Dexa,Covid and Flu or postpone if patient refuses.     HPI: Patient is a 81 y.o. female seen today for medical management of chronic diseases.    Patient lives in [facility] in [place]   She has h/o HLD Did not tolerate Statin H/o Breast Cancer s/p Right Mastectomy S/P Right hip Arthroplasty  in 9/21 H/o Intraventricular Hemorrhage in 10/16 after a fall Diabetes Type 2  Depression Insomnia  Dementia Alzheimer's dementia last MMSE 25 out of 30   Recent diagnosis of orthostatic hypotension with falls and dizziness. 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   Blepharitis 12/06/2019   Collapsed arches  04/03/2019   She has marked navicular drop and subtalar shift on RT Not as severe on left   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   History of hysterectomy 03/18/2021   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   Hyperlipidemia 11/30/2018   Knee pain 54/00/8676   Lichen sclerosus 19/50/9326   Low back pain 02/22/2017   Malignant neoplasm of upper-outer quadrant of right female breast 1999   Pain in toe 12/06/2019   Personal history of chemotherapy    Personal history of radiation therapy    Unilateral primary osteoarthritis, 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    Past Surgical History:  Procedure Laterality Date   ABDOMINAL HYSTERECTOMY  1998   fibroid   BREAST LUMPECTOMY Right 1999   CHOLECYSTECTOMY     JOINT REPLACEMENT     L knee replacement   TONSILLECTOMY     TOTAL HIP ARTHROPLASTY Right 10/26/2019   Procedure: RIGHT TOTAL HIP ARTHROPLASTY ANTERIOR APPROACH;  Surgeon: Mcarthur Rossetti, MD;  Location: [facility];  Service: Orthopedics;  Laterality: Right;   WISDOM TOOTH EXTRACTION      No Known Allergies  Outpatient Encounter Medications as of 12/01/2021  Medication Sig   ARIPiprazole (ABILIFY) 2 MG tablet Take 2 mg by mouth daily.   buPROPion (WELLBUTRIN XL) 150 MG 24 hr tablet Take 150 mg by mouth daily.   clobetasol ointment (TEMOVATE) 7.12 % Apply 1 Application topically 2 (two) times daily.   lactose free nutrition (BOOST) LIQD Take 237 mLs by mouth every  morning.   melatonin 1 MG TABS tablet Take 1 mg by mouth at bedtime.   metFORMIN (GLUCOPHAGE) 500 MG tablet Take 0.5 tablets (250 mg total) by mouth 2 (two) times daily with a meal.   sertraline (ZOLOFT) 50 MG tablet Take 25 mg by mouth at bedtime.   No facility-administered encounter medications on file as of 12/01/2021.    Review of Systems:  Review of Systems  Constitutional:  Negative for activity change and appetite change.  HENT: Negative.    Respiratory:  Negative for cough and shortness of breath.    Cardiovascular:  Negative for leg swelling.  Gastrointestinal:  Negative for constipation.  Genitourinary: Negative.   Musculoskeletal:  Negative for arthralgias, gait problem and myalgias.  Skin: Negative.   Neurological:  Negative for dizziness and weakness.  Psychiatric/Behavioral:  Positive for confusion. Negative for dysphoric mood and sleep disturbance.     Health Maintenance  Topic Date Due   FOOT EXAM  Never done   OPHTHALMOLOGY EXAM  Never done   Diabetic kidney evaluation - Urine ACR  Never done   Hepatitis C Screening  Never done   TETANUS/TDAP  Never done   Zoster Vaccines- Shingrix (1 of 2) Never done   Pneumonia Vaccine 24+ Years old (1 - PCV) Never done   DEXA SCAN  Never done   COVID-19 Vaccine (3 - Moderna risk series) 01/29/2020   HEMOGLOBIN A1C  04/07/2022   Diabetic kidney evaluation - GFR measurement  10/06/2022   INFLUENZA VACCINE  Completed   HPV VACCINES  Aged Out    Physical Exam: Vitals:   12/01/21 1001  BP: 126/72  Pulse: 90  Resp: 18  Temp: 97.9 F (36.6 C)  TempSrc: Temporal  SpO2: 96%  Weight: 156 lb 9.6 oz (71 kg)  Height: '5\' 5"'$  (1.651 m)   Body mass index is 26.06 kg/m. Physical Exam Vitals reviewed.  Constitutional:      Appearance: Normal appearance.  HENT:     Head: Normocephalic.     Nose: Nose normal.     Mouth/Throat:     Mouth: Mucous membranes are moist.     Pharynx: Oropharynx is clear.  Eyes:     Pupils: Pupils are equal, round, and reactive to light.  Cardiovascular:     Rate and Rhythm: Normal rate and regular rhythm.     Pulses: Normal pulses.     Heart sounds: Normal heart sounds. No murmur heard. Pulmonary:     Effort: Pulmonary effort is normal.     Breath sounds: Normal breath sounds.  Abdominal:     General: Abdomen is flat. Bowel sounds are normal.     Palpations: Abdomen is soft.  Musculoskeletal:        General: No swelling.     Cervical back: Neck supple.     Comments: Foot exam done DP  Palpable Normal sensation Skin normal  Skin:    General: Skin is warm.  Neurological:     General: No focal deficit present.     Mental Status: She is alert.  Psychiatric:        Mood and Affect: Mood normal.        Thought Content: Thought content normal.     Labs reviewed: Basic Metabolic Panel: Recent Labs    08/31/21 2312 09/01/21 0813 09/02/21 0042 09/08/21 0000 09/28/21 1615 10/05/21 0000  NA 130* 134* 132* 137 136 133*  K 3.6 3.5 3.4* 4.1 2.8* 4.5  CL 94* 98 98 101 109  98*  CO2 '25 24 25 21 22 '$ 24*  GLUCOSE 167* 160* 118*  --  118*  --   BUN 6* 7* 5* '8 9 8  '$ CREATININE 0.85 0.71 0.63 0.6 0.59 0.7  CALCIUM 8.8* 9.1 8.9 9.0 7.4* 9.5  TSH 2.192  --   --   --   --   --    Liver Function Tests: Recent Labs    08/31/21 2312 09/02/21 0042 10/05/21 0000  AST 33 25 33  ALT 45* 35 36*  ALKPHOS 59 55 74  BILITOT 0.2* 0.9  --   PROT 6.9 6.2*  --   ALBUMIN 3.8 3.5 4.2   No results for input(s): "LIPASE", "AMYLASE" in the last 8760 hours. No results for input(s): "AMMONIA" in the last 8760 hours. CBC: Recent Labs    08/31/21 2312 09/01/21 0631 09/02/21 0245 09/08/21 0000 09/28/21 1615  WBC 12.6* 12.9* 8.5 8.3 8.1  NEUTROABS 10.0* 10.1*  --   --   --   HGB 12.5 14.4 11.9* 13.6 13.3  HCT 36.8 40.9 34.1* 39 39.5  MCV 90.2 88.3 88.6  --  91.4  PLT 238 226 194 138* 224   Lipid Panel: Recent Labs    04/30/21 0000  CHOL 245*  HDL 74*  LDLCALC 142  TRIG 144   Lab Results  Component Value Date   HGBA1C 6.9 10/05/2021    Procedures since last visit: No results found.  Assessment/Plan  1. Orthostatic hypotension Resolved so far after she has been off Aricept and eating and drinking better  2. Controlled type 2 diabetes mellitus with complication, without long-term current use of insulin ([place]) Wrote for Eye exam  A1C is good level on Metformin   3. 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 Acute depression Doing well on Abilify Wellbutrin and Zoloft No Change today  5. Mixed hyperlipidemia Repeat Lipid and consider low dose statin  6. Seizure ([place]) ? Orthostatic Syncope No Post ictal symptoms both episodes  7. Primary insomnia On melatonin Doing better  Ordered PCV 20 Labs/tests ordered:  CBC,CMP, Lipid, TSH, A1C before next visit Next appt:  03/08/2022

## 2022-01-13 ENCOUNTER — Ambulatory Visit (INDEPENDENT_AMBULATORY_CARE_PROVIDER_SITE_OTHER): Payer: Medicare PPO | Admitting: Physician Assistant

## 2022-01-13 ENCOUNTER — Encounter: Payer: Self-pay | Admitting: Physician Assistant

## 2022-01-13 VITALS — BP 126/74 | HR 95 | Resp 18 | Wt 156.0 lb

## 2022-01-13 DIAGNOSIS — G3184 Mild cognitive impairment, so stated: Secondary | ICD-10-CM

## 2022-01-13 NOTE — Progress Notes (Signed)
Assessment/Plan:   Amnestic Mild Cognitive Impairment, likely due to Alzheimer's Disease   Jodi Summers is a very pleasant 81 y.o. RH female, with history of breast cancer status post right mastectomy chemo/XRT, arthritis, history of intraventricular hemorrhage in October 2016 after a fall,  DM2, depression, insomnia, hypertension, hyperlipidemia, and situational depression and a diagnosis of mild cognitive impairment likely due to Alzheimer's disease per neuropsychological evaluation on 06/04/2021 presenting today in follow-up for evaluation of memory loss.  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  Patient was on donepezil 10 mg daily but this was held at the hospital due to [facility] and dizziness resulting in a fall. She has been since Oct 19 on rivastigmine 4.6 mg patch daily, tolerating well.  MMSE today is  28 /30, stable from the cognitive standpoint.   Recommendations:   Follow up in 6  months. Continue rivastigmine 4.6 mg TD daily,  as per PCP ,side effects discussed Repeat neurocognitive testing for diagnostic clarity and trajectory of the disease.This is scheduled for 06/2022  Continue to maintain good control of cardiovascular risk factors Continue to control mood as per PCP, continue Wellbutrin, Zoloft and Abilify    Subjective:   This patient is accompanied in the office by her daughter  who supplements the history. Previous records as well as any outside records available were reviewed prior to todays visit.  Last MMSE at the time of her visit on 07/20/2021 was 25/30    Any changes in memory since last visit?  Her daughter reports that Jodi Summers has "a little more difficulty with words", although patient is not aware of any changes. Sometimes she has difficulty remembering the grandchildren's names, appointments  or recent conversations.  She continues to do crossword puzzles without difficulty.  At [facility], she enjoys all the activities provided by the facility. repeats oneself?  Endorsed, "maybe a little more frequently", such as asking "where we going, would rather wait doing at the appointment?  " Disoriented when walking into a room? "Sometimes she wonders if she has another home in her mind" Leaving objects in unusual places?  Patient denies   Ambulates  with difficulty?   Patient denies   Recent falls? July 19 had a fall due to [facility] without LOC or head injuries.  Any head injuries?  Patient denies   History of seizures?   Patient denies   Wandering behavior?  Patient denies. "She does not go outside much" Patient drives?  No longer drives Any mood changes since last visit?  Patient denies   Any worsening depression?:  Patient denies   Hallucinations?  Patient denies   Paranoia?  Patient denies   Patient denies any vivid dreams, REM behavior or sleepwalking. "Once in a while she does not sleep well, needs the melatonin"   History of sleep apnea?  Patient denies   Any hygiene concerns?  "Need to be reminded" Staff helps her. Independent of bathing and dressing?  Endorsed  Does the patient needs help with medications?  Facility in charge ([facility] in ALF on 5/22) Who is in charge of the finances?  Daughter is in charge    Any changes in appetite?  Patient denies. Drinks better.     Patient have trouble swallowing? Patient denies   Does the patient cook?  Patient denies   Any headaches?  Patient denies  Double vision? Patient denies   Any focal numbness or tingling? Denies Dizziness? "Not now" She has a history of orthostatic hypotension followed by cardiology, and on 09/28/2021 she had an orthostatic syncopal episode that needed presentation to the ED.At the time she was dehydrated. Her donepezil was held.  Chronic back pain Patient denies   Unilateral weakness?  Patient denies   Any  tremors?  Patient denies   Any history of anosmia?  Patient denies   Any incontinence of urine?  Patient denies   Any bowel dysfunction?   Patient denies      Patient lives at [facility], she enjoys the activities there.     Initial Visit 03/04/21 The patient is seen in neurologic consultation at the request of [name], MD for the evaluation of memory. The patient is accompanied by [name] who supplements the history. This is a 81 y.o. year old RH  female who has had memory issues for about 3 years.  The patient has moved from [place] during the pandemic to be near her daughter, and moved to [facility] independent living, which is reported to be an "exhausting experience, as she was presented with so many things to do and to many people introducing themselves, which appeared to be overwhelming to her ".  Her daughter reports that this took a toll on her emotional and mental readiness.  Due to a noticeable cognitive decline, her daughter arranged Neuropsych evaluation by Cher Nakai, PhD from neuropsychology at [facility]) in 05/27/2020 yielding an normal examination.  Over the last 6 to 8 months, her daughter states that short-term memory is worse, sometimes not remembering that she asked the same question, or told the same story.  She has not been sleeping well either, tried low dose of melatonin without significant improvement.  Denies any vivid dreams, REM behavior, or sleepwalking.  She denies any hallucinations, but she uses a cane "just in case I need to fight somebody that tries to get in the apartment ".  She denies any confusion when coming to her room, or leaving objects in unusual places.  There is some degree of depression due to all this changes.  She enjoys doing crossword puzzles and word finding.  She still trying to adjust to independent living facility.  In October 2022 she had COVID, and was in isolation for about 10 days, which in turn  appeared to be therapeutic, as she did not need to interact with other residents.  There are no hygiene concerns, she is independent of bathing and dressing.  Her medications are in a pillbox, occasionally she may forget the dose.  Daughter is in charge of the finances.  Appetite is fairly good, denies trouble swallowing.  She denies any recent falls.  She sustained a head injury in October 2016 after falling in the parking lot of her [employer], with loss of consciousness but with no reported retroactive amnesia, with intraventricular hemorrhage needing hospitalization.  She had posttraumatic amnestic period of less than 4 hours.  Denies headaches, double vision, dizziness, focal numbness or tingling, unilateral weakness or tremors or anosmia. No history of seizures. Denies urine incontinence, retention, constipation or diarrhea.  Denies OSA, ETOH or Tobacco. Family History strong for Alzheimer's disease in sister and mother.  She is a retired Pharmacist, hospital    CT scan performed in July 2016 reportedly revealed a linear hyperdensity within the posterior aspect of the lateral ventricles consistent with a small amount of intraventricular hemorrhage.  There was also minimal blood products layering in the posterior horns of the lateral ventricles. Moreover, there was a 2 mm subdural hematoma lateral to the right temporal lobe. It was purportedly not hyperdense and therefore could be subacute. Focal hyperdensity in the right sylvian fissure  which possibly represent a minimal amount of traumatic subarachnoid hemorrhage.  A brain MRI scan performed in July 2016 reportedly revealed global parenchymal brain volume loss mildly advanced for age and mild white matter changes compatible with chronic small vessel ischemic disease.   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 Melvyn Novas. Briefly, results suggested impairment across encoding (i.e., learning) and retrieval aspects of memory, with the latter domain representing greater dysfunction. Performance variability was exhibited across confrontation naming, semantic fluency, and recognition/consolidation aspects of memory. Relative to her previous evaluation in April 2022, decline was seen across both encoding and retrieval aspects of memory, as well as semantic fluency. Variability across confrontation naming was stable. Performances across all other assessed domains exhibited a good degree of stability when there were appropriate tests to make direct comparisons. The etiology of ongoing dysfunction and decline is somewhat unclear at the present time. With that being said, I do have concerns for an underlying neurodegenerative condition, namely Alzheimer's disease. Both Ms. Vanorder and her daughter described progressive cognitive and functional decline, which certainly raises concern for a degenerative illness. Across memory testing, Ms. Coulon was essentially amnestic across all memory tasks after a brief delay and exhibited variability across recognition trials. This suggests the presence of rapid forgetting and concern for an evolving memory storage deficit. These areas of memory dysfunction represent classic patterns seen early on in Alzheimer's disease. Additionally, variability/weakness in confrontation naming and semantic fluency would represent typical disease progression. As such, this condition remains plausible and would be in earlier stages at the present time if truly present.     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   History of hysterectomy  03/18/2021   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   Hyperlipidemia 11/30/2018   Knee pain 74/25/9563   Lichen sclerosus 87/56/4332   Low back pain 02/22/2017   Malignant neoplasm of upper-outer quadrant of right female breast 1999   Pain in toe 12/06/2019   Personal history of chemotherapy    Personal history of radiation therapy    Unilateral primary osteoarthritis, 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     Past Surgical History:  Procedure Laterality Date   ABDOMINAL HYSTERECTOMY  1998   fibroid   BREAST LUMPECTOMY Right 1999   CHOLECYSTECTOMY     JOINT REPLACEMENT     L knee replacement   TONSILLECTOMY     TOTAL HIP ARTHROPLASTY Right 10/26/2019   Procedure: RIGHT TOTAL HIP ARTHROPLASTY ANTERIOR APPROACH;  Surgeon: Mcarthur Rossetti, MD;  Location: [facility];  Service: Orthopedics;  Laterality: Right;   WISDOM TOOTH EXTRACTION       PREVIOUS MEDICATIONS:   CURRENT MEDICATIONS:  Outpatient Encounter Medications as of 01/13/2022  Medication Sig   ARIPiprazole (ABILIFY) 2 MG tablet Take 2 mg by mouth daily.   buPROPion (WELLBUTRIN XL) 150 MG  24 hr tablet Take 150 mg by mouth daily.   clobetasol ointment (TEMOVATE) 9.37 % Apply 1 Application topically 2 (two) times daily.   lactose free nutrition (BOOST) LIQD Take 237 mLs by mouth every morning.   melatonin 1 MG TABS tablet Take 1 mg by mouth at bedtime.   metFORMIN (GLUCOPHAGE) 500 MG tablet Take 0.5 tablets (250 mg total) by mouth 2 (two) times daily with a meal.   rivastigmine (EXELON) 4.6 mg/24hr Place 4.6 mg onto the skin daily.   sertraline (ZOLOFT) 50 MG tablet Take 25 mg by mouth at bedtime.   No facility-administered encounter medications on file as of 01/13/2022.     Objective:     PHYSICAL EXAMINATION:    VITALS:   Vitals:   01/13/22 1141  BP: 126/74  Pulse: 95  Resp: 18  SpO2: 98%  Weight: 156 lb (70.8 kg)    GEN:  The patient appears stated age and is in NAD. HEENT:  Normocephalic, atraumatic.    Neurological examination:  General: NAD, well-groomed, appears stated age. Orientation: The patient is alert. Oriented to person, place, year and month, not to date. Cranial nerves: There is good facial symmetry.The speech is fluent and clear, takes some time to answer questions but does so accurately. No aphasia or dysarthria. Fund of knowledge is appropriate. Recent memory impaired and remote memory is normal.  Attention and concentration are normal.  Able to name objects and repeat phrases.  Hearing is intact to conversational tone.    Sensation: Sensation is intact to light touch throughout Motor: Strength is at least antigravity x4. Tremors: none  DTR's 2/4 in UE/LE      03/05/2021    6:00 AM  Montreal Cognitive Assessment   Visuospatial/ Executive (0/5) 5  Naming (0/3) 2  Attention: Read list of digits (0/2) 2  Attention: Read list of letters (0/1) 1  Attention: Serial 7 subtraction starting at 100 (0/3) 3  Language: Repeat phrase (0/2) 1  Language : Fluency (0/1) 1  Abstraction (0/2) 1  Delayed Recall (0/5) 3  Orientation (0/6) 6  Total 25  Adjusted Score (based on education) 25       07/20/2021    1:54 PM  MMSE - Mini Mental State Exam  Orientation to time 4  Orientation to Place 4  Registration 3  Attention/ Calculation 1  Recall 3  Language- name 2 objects 2  Language- repeat 1  Language- follow 3 step command 3  Language- read & follow direction 1  Write a sentence 1  Copy design 1  Total score 24       Movement examination: Tone: There is normal tone in the UE/LE Abnormal movements:  no tremor.  No myoclonus.  No asterixis.   Coordination:  There is no decremation with RAM's. Normal finger to nose  Gait and Station: The patient has no difficulty arising out of a deep-seated chair without the use of the hands. The patient's stride length is good.  Gait is cautious and narrow.   Thank you for allowing [place] the opportunity to participate in the care of this  nice patient. Please do not hesitate to contact us for any questions or concerns.   Total time spent on today's visit was 40 minutes dedicated to this patient today, preparing to see patient, examining the patient, ordering tests and/or medications and counseling the patient, documenting clinical information in the EHR or other health record, independently interpreting results and communicating results to the patient/family, discussing  treatment and goals, answering patient's questions and coordinating care.  Cc:  [name], MD  Sharene Butters 01/13/2022 12:32 PM

## 2022-01-13 NOTE — Patient Instructions (Addendum)
It was a pleasure to see you today at our office.   Recommendations:  Follow up in June 6 11:30  Continue rivastigmine 4.6 mg patch . Side effects were discussed  Repeat neurocognitive testing as schedule     Whom to call:  Memory  decline, memory medications: Call out office [phone]   For psychiatric meds, mood meds: Please have your primary care physician manage these medications.     For assessment of decision of mental capacity and competency:  Call Dr. Anthoney Harada, geriatric psychiatrist at [phone]  For guidance in geriatric dementia issues please call [facility] [phone]   If you have any severe symptoms of a stroke, or other severe issues such as confusion,severe chills or fever, etc call 911 or go to the ER as you may need to be evaluate further   Feel free to visit Facebook page " [email or web address]" for tips of how to care for people with memory problems.   Feel free to go to the following database for funded clinical studies conducted around the world: http://saunders.com/   https://www.triadclinicaltrials.com/     RECOMMENDATIONS FOR ALL PATIENTS WITH MEMORY PROBLEMS: 1. Continue to exercise (Recommend 30 minutes of walking everyday, or 3 hours every week) 2. Increase social interactions - continue going to [employer] and enjoy social gatherings with friends and family 3. Eat healthy, avoid fried foods and eat more fruits and vegetables 4. Maintain adequate blood pressure, blood sugar, and blood cholesterol level. Reducing the risk of stroke and cardiovascular disease also helps promoting better memory. 5. Avoid stressful situations. Live a simple life and avoid aggravations. Organize your time and prepare for the next day in anticipation. 6. Sleep well, avoid any interruptions of sleep and avoid any distractions in the bedroom that may interfere with adequate sleep quality 7. Avoid sugar, avoid sweets as there is a strong link between excessive  sugar intake, diabetes, and cognitive impairment We discussed the Mediterranean diet, which has been shown to help patients reduce the risk of progressive memory disorders and reduces cardiovascular risk. This includes eating fish, eat fruits and green leafy vegetables, nuts like almonds and hazelnuts, walnuts, and also use olive oil. Avoid fast foods and fried foods as much as possible. Avoid sweets and sugar as sugar use has been linked to worsening of memory function.  There is always a concern of gradual progression of memory problems. If this is the case, then we may need to adjust level of care according to patient needs. Support, both to the patient and caregiver, should then be put into place.    The Alzheimer[employer] Association is here all day, every day for people facing Alzheimer's disease through our free 24/7 Helpline: (803)307-1543. The Helpline provides reliable information and support to all those who need assistance, such as individuals living with memory loss, Alzheimer's or other dementia, caregivers, health care professionals and the public.  Our highly trained and knowledgeable staff can help you with: Understanding memory loss, dementia and Alzheimer's  Medications and other treatment options  General information about aging and brain health  Skills to provide quality care and to find the best care from professionals  Legal, financial and living-arrangement decisions Our Helpline also features: Confidential care consultation provided by master's level clinicians who can help with decision-making support, crisis assistance and education on issues families face every day  Help in a caller's preferred language using our translation service that features more than 200 languages and dialects  Referrals to local community programs,  services and ongoing support     FALL PRECAUTIONS: Be cautious when walking. Scan the area for obstacles that may increase the risk of trips and falls. When  getting up in the mornings, sit up at the edge of the bed for a few minutes before getting out of bed. Consider elevating the bed at the head end to avoid drop of blood pressure when getting up. Walk always in a well-lit room (use night lights in the walls). Avoid area rugs or power cords from appliances in the middle of the walkways. Use a walker or a cane if necessary and consider physical therapy for balance exercise. Get your eyesight checked regularly.  FINANCIAL OVERSIGHT: Supervision, especially oversight when making financial decisions or transactions is also recommended.  HOME SAFETY: Consider the safety of the kitchen when operating appliances like stoves, microwave oven, and blender. Consider having supervision and share cooking responsibilities until no longer able to participate in those. Accidents with firearms and other hazards in the house should be identified and addressed as well.   ABILITY TO BE LEFT ALONE: If patient is unable to contact 911 operator, consider using LifeLine, or when the need is there, arrange for someone to stay with patients. Smoking is a fire hazard, consider supervision or cessation. Risk of wandering should be assessed by caregiver and if detected at any point, supervision and safe proof recommendations should be instituted.            Mediterranean Diet A Mediterranean diet refers to food and lifestyle choices that are based on the traditions of countries located on the [employer]. This way of eating has been shown to help prevent certain conditions and improve outcomes for people who have chronic diseases, like kidney disease and heart disease. What are tips for following this plan? Lifestyle  Cook and eat meals together with your family, when possible. Drink enough fluid to keep your urine clear or pale yellow. Be physically active every day. This includes: Aerobic exercise like running or swimming. Leisure activities like gardening,  walking, or housework. Get 7-8 hours of sleep each night. If recommended by your health care provider, drink red wine in moderation. This means 1 glass a day for nonpregnant women and 2 glasses a day for men. A glass of wine equals 5 oz (150 mL). Reading food labels  Check the serving size of packaged foods. For foods such as rice and pasta, the serving size refers to the amount of cooked product, not dry. Check the total fat in packaged foods. Avoid foods that have saturated fat or trans fats. Check the ingredients list for added sugars, such as corn syrup. Shopping  At the grocery store, buy most of your food from the areas near the walls of the store. This includes: Fresh fruits and vegetables (produce). Grains, beans, nuts, and seeds. Some of these may be available in unpackaged forms or large amounts (in bulk). Fresh seafood. Poultry and eggs. Low-fat dairy products. Buy whole ingredients instead of prepackaged foods. Buy fresh fruits and vegetables in-season from local farmers markets. Buy frozen fruits and vegetables in resealable bags. If you do not have access to quality fresh seafood, buy precooked frozen shrimp or canned fish, such as tuna, salmon, or sardines. Buy small amounts of raw or cooked vegetables, salads, or olives from the deli or salad bar at your store. Stock your pantry so you always have certain foods on hand, such as olive oil, canned tuna, canned tomatoes, rice, pasta, and beans.  Cooking  Cook foods with extra-virgin olive oil instead of using butter or other vegetable oils. Have meat as a side dish, and have vegetables or grains as your main dish. This means having meat in small portions or adding small amounts of meat to foods like pasta or stew. Use beans or vegetables instead of meat in common dishes like chili or lasagna. Experiment with different cooking methods. Try roasting or broiling vegetables instead of steaming or sauteing them. Add frozen vegetables  to soups, stews, pasta, or rice. Add nuts or seeds for added healthy fat at each meal. You can add these to yogurt, salads, or vegetable dishes. Marinate fish or vegetables using olive oil, lemon juice, garlic, and fresh herbs. Meal planning  Plan to eat 1 vegetarian meal one day each week. Try to work up to 2 vegetarian meals, if possible. Eat seafood 2 or more times a week. Have healthy snacks readily available, such as: Vegetable sticks with hummus. Greek yogurt. Fruit and nut trail mix. Eat balanced meals throughout the week. This includes: Fruit: 2-3 servings a day Vegetables: 4-5 servings a day Low-fat dairy: 2 servings a day Fish, poultry, or lean meat: 1 serving a day Beans and legumes: 2 or more servings a week Nuts and seeds: 1-2 servings a day Whole grains: 6-8 servings a day Extra-virgin olive oil: 3-4 servings a day Limit red meat and sweets to only a few servings a month What are my food choices? Mediterranean diet Recommended Grains: Whole-grain pasta. Brown rice. Bulgar wheat. Polenta. Couscous. Whole-wheat bread. [place]. Vegetables: Artichokes. Beets. Broccoli. Cabbage. Carrots. Eggplant. Green beans. Chard. Kale. Spinach. Onions. Leeks. Peas. Squash. Tomatoes. Peppers. Radishes. Fruits: Apples. Apricots. Avocado. Berries. Bananas. Cherries. Dates. Figs. Grapes. Lemons. Melon. Oranges. Peaches. Plums. Pomegranate. Meats and other protein foods: Beans. Almonds. Sunflower seeds. Pine nuts. Peanuts. Antelope. Salmon. Scallops. Shrimp. [place]. Tilapia. Clams. Oysters. Eggs. Dairy: Low-fat milk. Cheese. Greek yogurt. Beverages: Water. Red wine. Herbal tea. Fats and oils: Extra virgin olive oil. Avocado oil. Grape seed oil. Sweets and desserts: [place] yogurt with honey. Baked apples. Poached pears. Trail mix. Seasoning and other foods: Basil. Cilantro. Coriander. Cumin. Mint. Parsley. Sage. Rosemary. Tarragon. Garlic. Oregano. Thyme. Pepper. Balsalmic vinegar. Tahini.  Hummus. Tomato sauce. Olives. Mushrooms. Limit these Grains: Prepackaged pasta or rice dishes. Prepackaged cereal with added sugar. Vegetables: Deep fried potatoes (french fries). Fruits: Fruit canned in syrup. Meats and other protein foods: Beef. Pork. Lamb. Poultry with skin. Hot dogs. Berniece Salines. Dairy: Ice cream. Sour cream. Whole milk. Beverages: Juice. Sugar-sweetened soft drinks. Beer. Liquor and spirits. Fats and oils: Butter. Canola oil. Vegetable oil. Beef fat (tallow). Lard. Sweets and desserts: Cookies. Cakes. Pies. Candy. Seasoning and other foods: Mayonnaise. Premade sauces and marinades. The items listed may not be a complete list. Talk with your dietitian about what dietary choices are right for you. Summary The Mediterranean diet includes both food and lifestyle choices. Eat a variety of fresh fruits and vegetables, beans, nuts, seeds, and whole grains. Limit the amount of red meat and sweets that you eat. Talk with your health care provider about whether it is safe for you to drink red wine in moderation. This means 1 glass a day for nonpregnant women and 2 glasses a day for men. A glass of wine equals 5 oz (150 mL). This information is not intended to replace advice given to you by your health care provider. Make sure you discuss any questions you have with your health care provider.

## 2022-02-12 ENCOUNTER — Other Ambulatory Visit: Payer: Self-pay | Admitting: Adult Health

## 2022-02-12 MED ORDER — ARIPIPRAZOLE 2 MG PO TABS: 2.0000 mg | ORAL_TABLET | Freq: Every day | ORAL | 0 refills | Status: DC

## 2022-02-12 NOTE — Progress Notes (Signed)
Pt is running out of abilify and is in [place]. One week supply sent.

## 2022-02-15 DIAGNOSIS — Z8616 Personal history of COVID-19: Secondary | ICD-10-CM | POA: Insufficient documentation

## 2022-02-15 HISTORY — DX: Personal history of COVID-19: Z86.16

## 2022-03-04 DIAGNOSIS — E871 Hypo-osmolality and hyponatremia: Secondary | ICD-10-CM | POA: Diagnosis not present

## 2022-03-04 DIAGNOSIS — E118 Type 2 diabetes mellitus with unspecified complications: Secondary | ICD-10-CM | POA: Diagnosis not present

## 2022-03-04 DIAGNOSIS — D649 Anemia, unspecified: Secondary | ICD-10-CM | POA: Diagnosis not present

## 2022-03-04 LAB — COMPREHENSIVE METABOLIC PANEL
Albumin: 3.9 (ref 3.5–5.0)
Calcium: 9.3 (ref 8.7–10.7)
Globulin: 2.8
eGFR: >90

## 2022-03-04 LAB — CBC AND DIFFERENTIAL
HCT: 39 (ref 36–46)
Hemoglobin: 13.4 (ref 12.0–16.0)
Platelets: 242 10*3/uL (ref 150–400)
WBC: 7.8

## 2022-03-04 LAB — TSH: TSH: 1.82 (ref 0.41–5.90)

## 2022-03-04 LAB — HEPATIC FUNCTION PANEL
ALT: 26 U/L (ref 7–35)
AST: 26 (ref 13–35)
Alkaline Phosphatase: 69 (ref 25–125)
Bilirubin, Total: 0.4

## 2022-03-04 LAB — BASIC METABOLIC PANEL
BUN: 11 (ref 4–21)
CO2: 22 (ref 13–22)
Chloride: 100 (ref 99–108)
Creatinine: 0.6 (ref 0.5–1.1)
Glucose: 138
Potassium: 4.1 mEq/L (ref 3.5–5.1)
Sodium: 135 — AB (ref 137–147)

## 2022-03-04 LAB — CBC: RBC: 4.43 (ref 3.87–5.11)

## 2022-03-04 LAB — LIPID PANEL
Cholesterol: 201 — AB (ref 0–200)
HDL: 54 (ref 35–70)
LDL Cholesterol: 133
LDl/HDL Ratio: 3.7
Triglycerides: 109 (ref 40–160)

## 2022-03-04 LAB — HEMOGLOBIN A1C: Hemoglobin A1C: 6.7

## 2022-03-07 NOTE — Progress Notes (Signed)
Location:  [facility]  POS: Clinic  Provider: Royal Hawthorn, ANP  Code Status:  Goals of Care:     03/08/2022    1:54 PM  Advanced Directives  Does Patient Have a Medical Advance Directive? Yes  Type of Advance Directive Living will;Healthcare Power of [place];Out of facility DNR (pink MOST or yellow form)  Does patient want to make changes to medical advance directive? No - Patient declined     Chief Complaint  Patient presents with   Medical Management of Chronic Issues    3 month follow up    HPI: Patient is a 82 y.o. female seen today for medical management of chronic diseases.    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  Off aricept due to possible s/e. Now on exelon.   Did have orthostatic hypotension which resolved ?aricept vs dehydration  Hx of anxiety/depression for years.   When she moved [place] there was some signs of paranoia, depression, worsening memory loss.  Currently on zoloft, abilify,  and wellbutrin with well controlled symptoms.   DM : on metformin  Lab Results  Component Value Date   HGBA1C 6.7 03/04/2022    HLD: did not tolerate statin, was on repatha at one time.   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   Hyperlipidemia 11/30/2018   Knee pain 09/81/1914   Lichen sclerosus  78/29/5621   Low back pain 02/22/2017   Malignant neoplasm of upper-outer quadrant of right female breast 1999   Pain in toe 12/06/2019   Personal history of chemotherapy    Personal history of radiation therapy    Unilateral primary osteoarthritis, 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    Past Surgical History:  Procedure Laterality Date   ABDOMINAL HYSTERECTOMY  1998   fibroid   BREAST LUMPECTOMY Right 1999   CHOLECYSTECTOMY     JOINT REPLACEMENT     L knee replacement   TONSILLECTOMY     TOTAL HIP ARTHROPLASTY Right 10/26/2019   Procedure: RIGHT TOTAL HIP ARTHROPLASTY ANTERIOR APPROACH;  Surgeon: Mcarthur Rossetti, MD;  Location: [facility];  Service: Orthopedics;  Laterality: Right;   WISDOM TOOTH EXTRACTION      No Known Allergies  Outpatient Encounter Medications as of 03/08/2022  Medication Sig   ARIPiprazole (ABILIFY) 2 MG tablet Take 1 tablet (2 mg total) by mouth daily.   buPROPion (WELLBUTRIN XL) 150 MG 24 hr tablet Take 150 mg by mouth daily.   clobetasol ointment (TEMOVATE) 3.08 % Apply 1 Application topically 2 (two) times daily.   lactose free nutrition (BOOST) LIQD Take 237 mLs by mouth every morning.   melatonin 1 MG TABS tablet Take 1 mg by mouth at bedtime.   metFORMIN (GLUCOPHAGE) 500 MG tablet Take 0.5 tablets (250 mg total) by mouth 2 (two) times daily with a meal.   rivastigmine (EXELON) 4.6 mg/24hr Place 4.6 mg onto the skin daily.  sertraline (ZOLOFT) 50 MG tablet Take 25 mg by mouth at bedtime.   No facility-administered encounter medications on file as of 03/08/2022.    Review of Systems:  Review of Systems  Constitutional:  Negative for activity change, appetite change, chills, diaphoresis, fatigue, fever and unexpected weight change.  HENT:  Negative for congestion.   Respiratory:  Negative for cough, shortness of breath and wheezing.   Cardiovascular:  Negative for chest pain, palpitations and leg swelling.  Gastrointestinal:  Negative for abdominal  distention, abdominal pain, constipation and diarrhea.  Genitourinary:  Negative for difficulty urinating and dysuria.  Musculoskeletal:  Positive for gait problem (uses walker). Negative for arthralgias, back pain, joint swelling and myalgias.  Neurological:  Negative for dizziness, tremors, seizures, syncope, facial asymmetry, speech difficulty, weakness, light-headedness, numbness and headaches.  Psychiatric/Behavioral:  Positive for confusion. Negative for agitation, behavioral problems, dysphoric mood and sleep disturbance. The patient is not nervous/anxious.        Memory loss     Health Maintenance  Topic Date Due   Medicare Annual Wellness (AWV)  Never done   OPHTHALMOLOGY EXAM  Never done   Diabetic kidney evaluation - Urine ACR  Never done   Hepatitis C Screening  Never done   DTaP/Tdap/Td (1 - Tdap) Never done   Zoster Vaccines- Shingrix (1 of 2) Never done   Pneumonia Vaccine 28+ Years old (1 - PCV) Never done   DEXA SCAN  Never done   COVID-19 Vaccine (4 - 2023-24 season) 02/15/2022   HEMOGLOBIN A1C  09/02/2022   FOOT EXAM  12/02/2022   Diabetic kidney evaluation - eGFR measurement  03/05/2023   INFLUENZA VACCINE  Completed   HPV VACCINES  Aged Out    Physical Exam: Vitals:   03/08/22 1351  BP: 134/68  Pulse: 94  Resp: 18  Temp: 97.9 F (36.6 C)  TempSrc: Temporal  SpO2: 96%  Weight: 158 lb (71.7 kg)  Height: '5\' 5"'$  (1.651 m)   Body mass index is 26.29 kg/m. Wt Readings from Last 3 Encounters:  03/08/22 158 lb (71.7 kg)  01/13/22 156 lb (70.8 kg)  12/01/21 156 lb 9.6 oz (71 kg)    Physical Exam Vitals and nursing note reviewed.  Constitutional:      General: She is not in acute distress.    Appearance: She is not diaphoretic.  HENT:     Head: Normocephalic and atraumatic.     Right Ear: Tympanic membrane normal. There is no impacted cerumen.     Left Ear: Tympanic membrane normal. There is no impacted cerumen.     Nose: Nose normal.      Mouth/Throat:     Mouth: Mucous membranes are moist.     Pharynx: Oropharynx is clear.  Eyes:     Conjunctiva/sclera: Conjunctivae normal.     Pupils: Pupils are equal, round, and reactive to light.  Neck:     Vascular: No JVD.  Cardiovascular:     Rate and Rhythm: Normal rate and regular rhythm.     Heart sounds: No murmur heard. Pulmonary:     Effort: Pulmonary effort is normal. No respiratory distress.     Breath sounds: Normal breath sounds. No wheezing.  Abdominal:     General: Bowel sounds are normal. There is no distension.     Palpations: Abdomen is soft.  Musculoskeletal:     Cervical back: No rigidity or tenderness.     Right lower leg: No edema.     Left lower leg:  No edema.  Lymphadenopathy:     Cervical: No cervical adenopathy.  Skin:    General: Skin is warm and dry.  Neurological:     Mental Status: She is alert and oriented to person, place, and time.  Psychiatric:        Mood and Affect: Mood normal.    Labs reviewed: Basic Metabolic Panel: Recent Labs    08/31/21 2312 09/01/21 0813 09/02/21 0042 09/08/21 0000 09/28/21 1615 10/05/21 0000 03/04/22 0000  NA 130* 134* 132*   < > 136 133* 135*  K 3.6 3.5 3.4*   < > 2.8* 4.5 4.1  CL 94* 98 98   < > 109 98* 100  CO2 '25 24 25   '$ < > 22 24* 22  GLUCOSE 167* 160* 118*  --  118*  --   --   BUN 6* 7* 5*   < > '9 8 11  '$ CREATININE 0.85 0.71 0.63   < > 0.59 0.7 0.6  CALCIUM 8.8* 9.1 8.9   < > 7.4* 9.5 9.3  TSH 2.192  --   --   --   --   --  1.82   < > = values in this interval not displayed.   Liver Function Tests: Recent Labs    08/31/21 2312 09/02/21 0042 10/05/21 0000 03/04/22 0000  AST 33 25 33 26  ALT 45* 35 36* 26  ALKPHOS 59 55 74 69  BILITOT 0.2* 0.9  --   --   PROT 6.9 6.2*  --   --   ALBUMIN 3.8 3.5 4.2 3.9   No results for input(s): "LIPASE", "AMYLASE" in the last 8760 hours. No results for input(s): "AMMONIA" in the last 8760 hours. CBC: Recent Labs    08/31/21 2312 09/01/21 0631  09/02/21 0245 09/08/21 0000 09/28/21 1615 03/04/22 0000  WBC 12.6* 12.9* 8.5 8.3 8.1 7.8  NEUTROABS 10.0* 10.1*  --   --   --   --   HGB 12.5 14.4 11.9* 13.6 13.3 13.4  HCT 36.8 40.9 34.1* 39 39.5 39  MCV 90.2 88.3 88.6  --  91.4  --   PLT 238 226 194 138* 224 242   Lipid Panel: Recent Labs    04/30/21 0000 03/04/22 0000  CHOL 245* 201*  HDL 74* 54  LDLCALC 142 133  TRIG 144 109   Lab Results  Component Value Date   HGBA1C 6.7 03/04/2022    Procedures since last visit: No results found.  Assessment/Plan  1. Situational depression She has adjusted to [facility] [place] and her medication regimen Is effective at this time Would not taper due to risk of increased symptoms of depression/psychosis.  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 Mixed hyperlipidemia Lab Results  Component Value Date   [place] 133 03/04/2022   Off repatha Continue to monitor and consider starting statin if worsening.    4. Controlled type 2 diabetes mellitus with complication, without long-term current use of insulin (HCC) A1C at goal  Continue metformin Has apt with ophthalmology  Urine micro ordered   5. Estrogen deficiency  - DG Bone Density; Future  6. Encounter for screening mammogram for malignant neoplasm of breast Hx of breast ca  - MM Digital Screening; Future  Ordered shingrix vaccine   Labs/tests ordered:  BMP A1C prior to next apt Next appt:     Total time 48mn:  time greater than 50% of total  time spent doing pt counseling and coordination of care

## 2022-03-08 ENCOUNTER — Encounter: Payer: Self-pay | Admitting: Adult Health

## 2022-03-08 ENCOUNTER — Non-Acute Institutional Stay: Payer: Medicare PPO | Admitting: Adult Health

## 2022-03-08 VITALS — BP 134/68 | HR 94 | Temp 97.9°F | Resp 18 | Ht 65.0 in | Wt 158.0 lb

## 2022-03-08 DIAGNOSIS — E782 Mixed hyperlipidemia: Secondary | ICD-10-CM

## 2022-03-08 DIAGNOSIS — G3184 Mild cognitive impairment, so stated: Secondary | ICD-10-CM | POA: Diagnosis not present

## 2022-03-08 DIAGNOSIS — F4321 Adjustment disorder with depressed mood: Secondary | ICD-10-CM | POA: Diagnosis not present

## 2022-03-08 DIAGNOSIS — E118 Type 2 diabetes mellitus with unspecified complications: Secondary | ICD-10-CM

## 2022-03-08 DIAGNOSIS — Z1231 Encounter for screening mammogram for malignant neoplasm of breast: Secondary | ICD-10-CM | POA: Diagnosis not present

## 2022-03-08 DIAGNOSIS — E2839 Other primary ovarian failure: Secondary | ICD-10-CM | POA: Diagnosis not present

## 2022-03-08 NOTE — Progress Notes (Signed)
abstract

## 2022-03-09 ENCOUNTER — Encounter: Payer: Self-pay | Admitting: Adult Health

## 2022-03-09 DIAGNOSIS — E119 Type 2 diabetes mellitus without complications: Secondary | ICD-10-CM | POA: Diagnosis not present

## 2022-05-03 IMAGING — RF DG HIP (WITH PELVIS) OPERATIVE*R*
1 series · 8 of 8 positions shown · non-contrast
Comparison: 08/08/2019.

CLINICAL DATA: Right hip arthroplasty.

EXAM:
OPERATIVE right HIP (WITH PELVIS IF PERFORMED) 5 VIEWS
TECHNIQUE: Fluoroscopic spot image(s) were submitted for interpretation
post-operatively.

[Series 1: unknown protocol · 0.20mm/px · 8 of 8 slices shown]
[im 1/8]
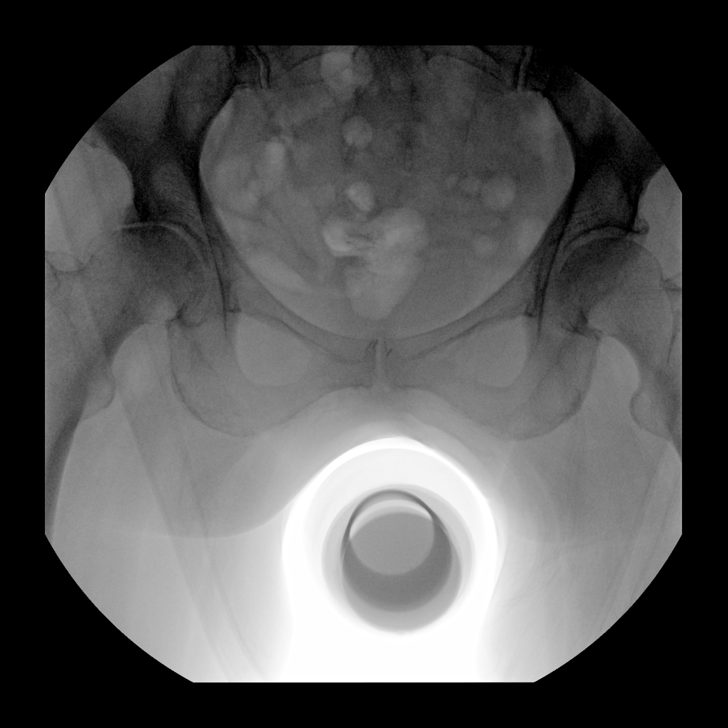
[im 2/8]
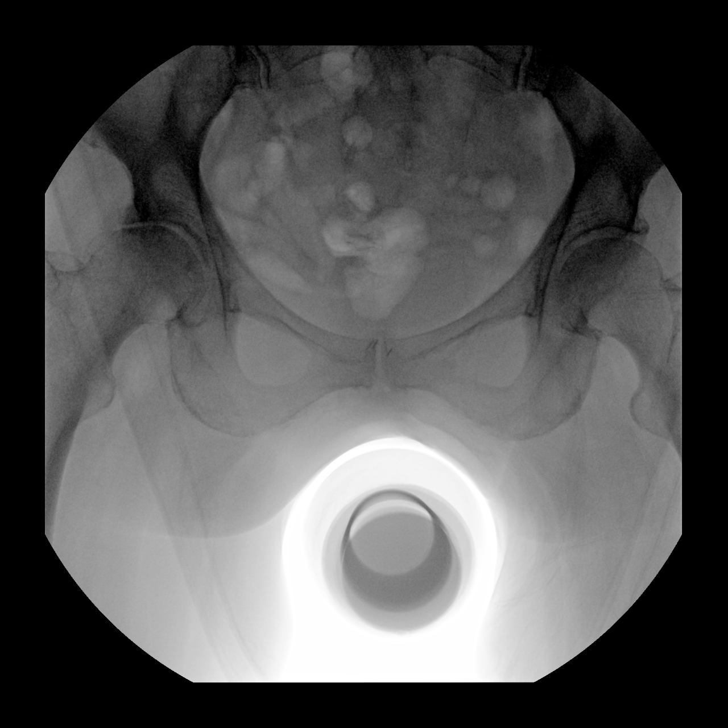
[im 3/8]
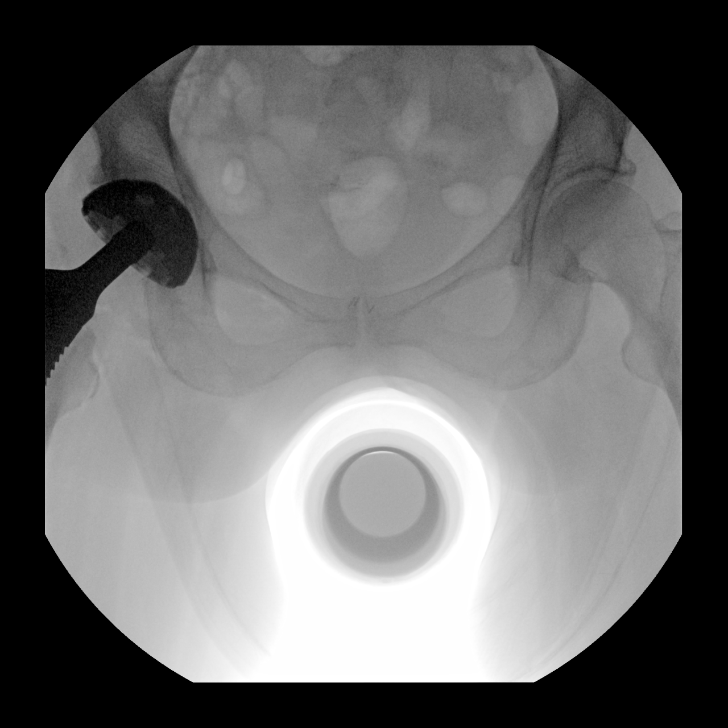
[im 4/8]
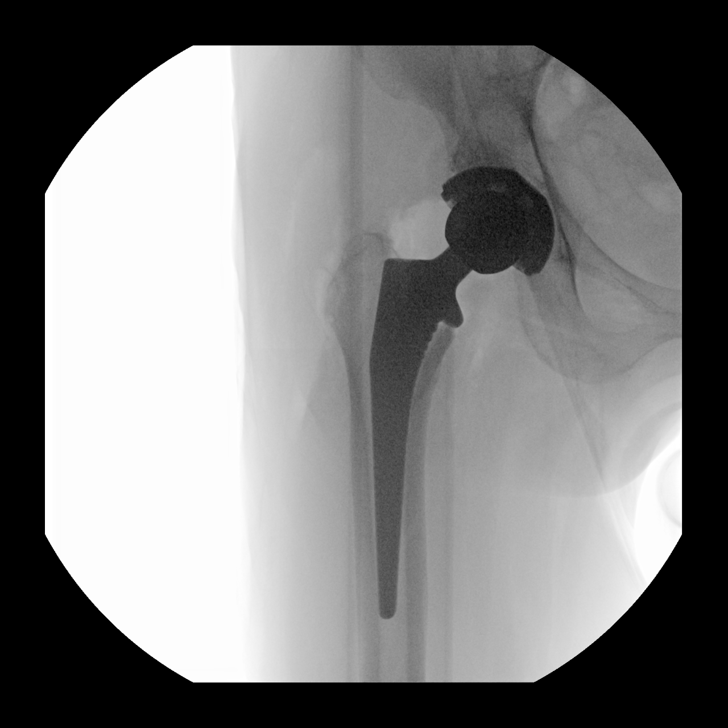
[im 5/8]
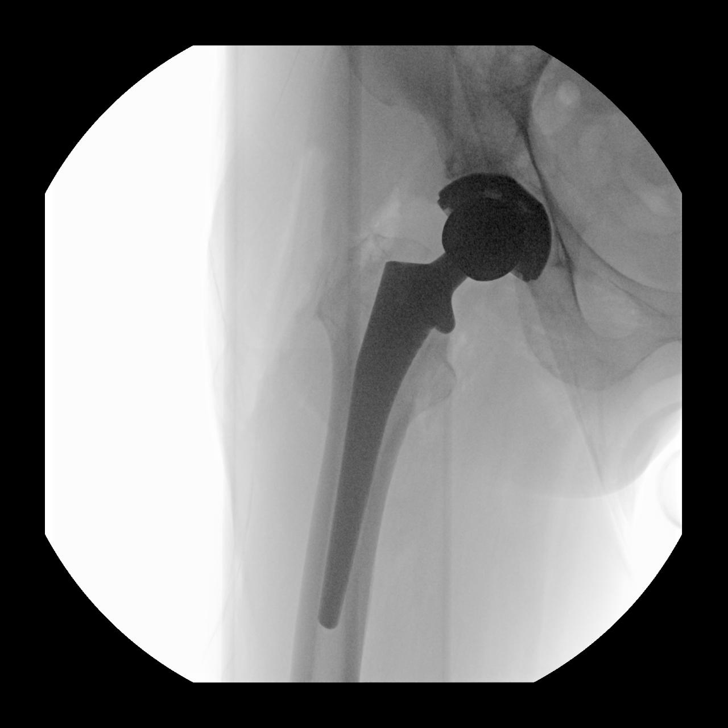
[im 6/8]
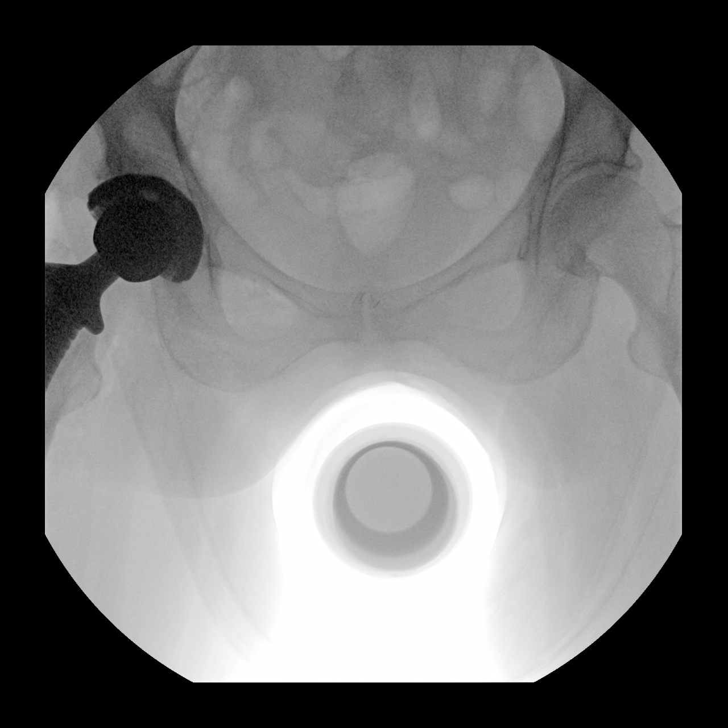
[im 7/8]
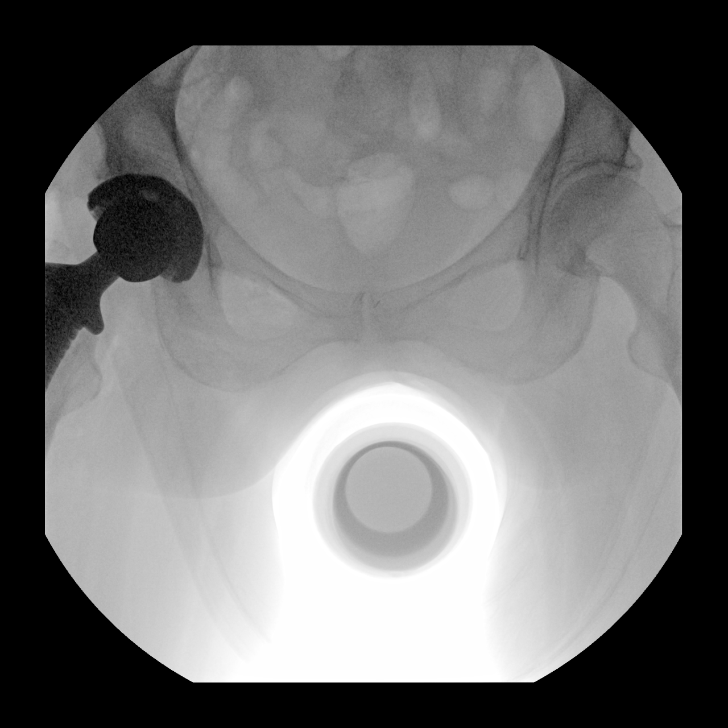
[im 8/8]
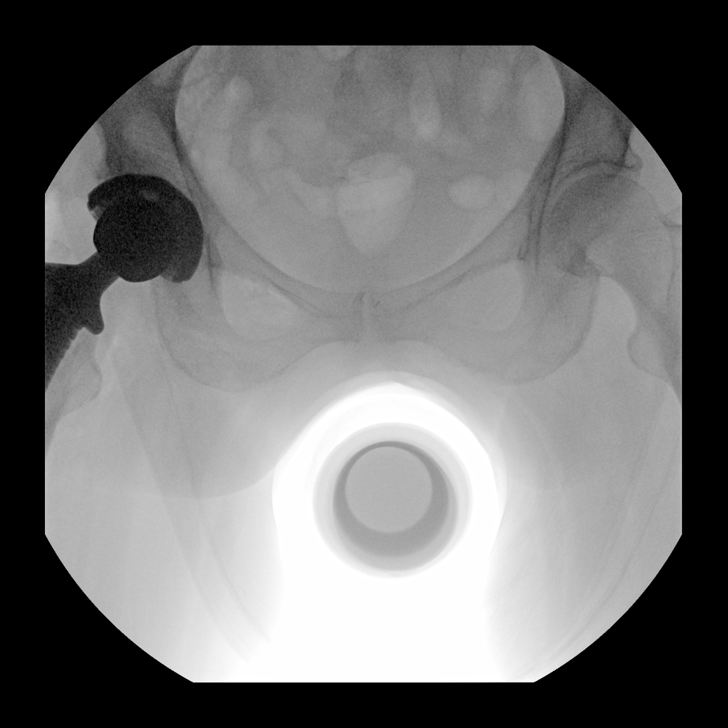

[8 of 8 positions shown; findings below may reference images not displayed]

FINDINGS: 5 intraoperative fluoroscopic spot views of the right hip are
submitted. Initial image taken at 4820 hours shows narrowing of the
superior joint space in the right hip with subchondral sclerosis,
osteophytosis and slight flattening of the right femoral head.
Collar osteophytosis is seen as well. Subsequent images show
placement of a right acetabular cup with incomplete visualization of
a proximal right femoral reamer, followed by placement of a right
hip arthroplasty. Femoral stem appears well seated.
IMPRESSION: Intraoperative visualization for right hip arthroplasty.

## 2022-05-03 IMAGING — DX DG PORTABLE PELVIS
1 series · 1 of 1 positions shown · non-contrast
Comparison: Intraoperative right hip radiographs October 26, 2019

CLINICAL DATA: Status post right total hip replacement

EXAM:
PORTABLE PELVIS 1-2 VIEWS

[pelvis ap]
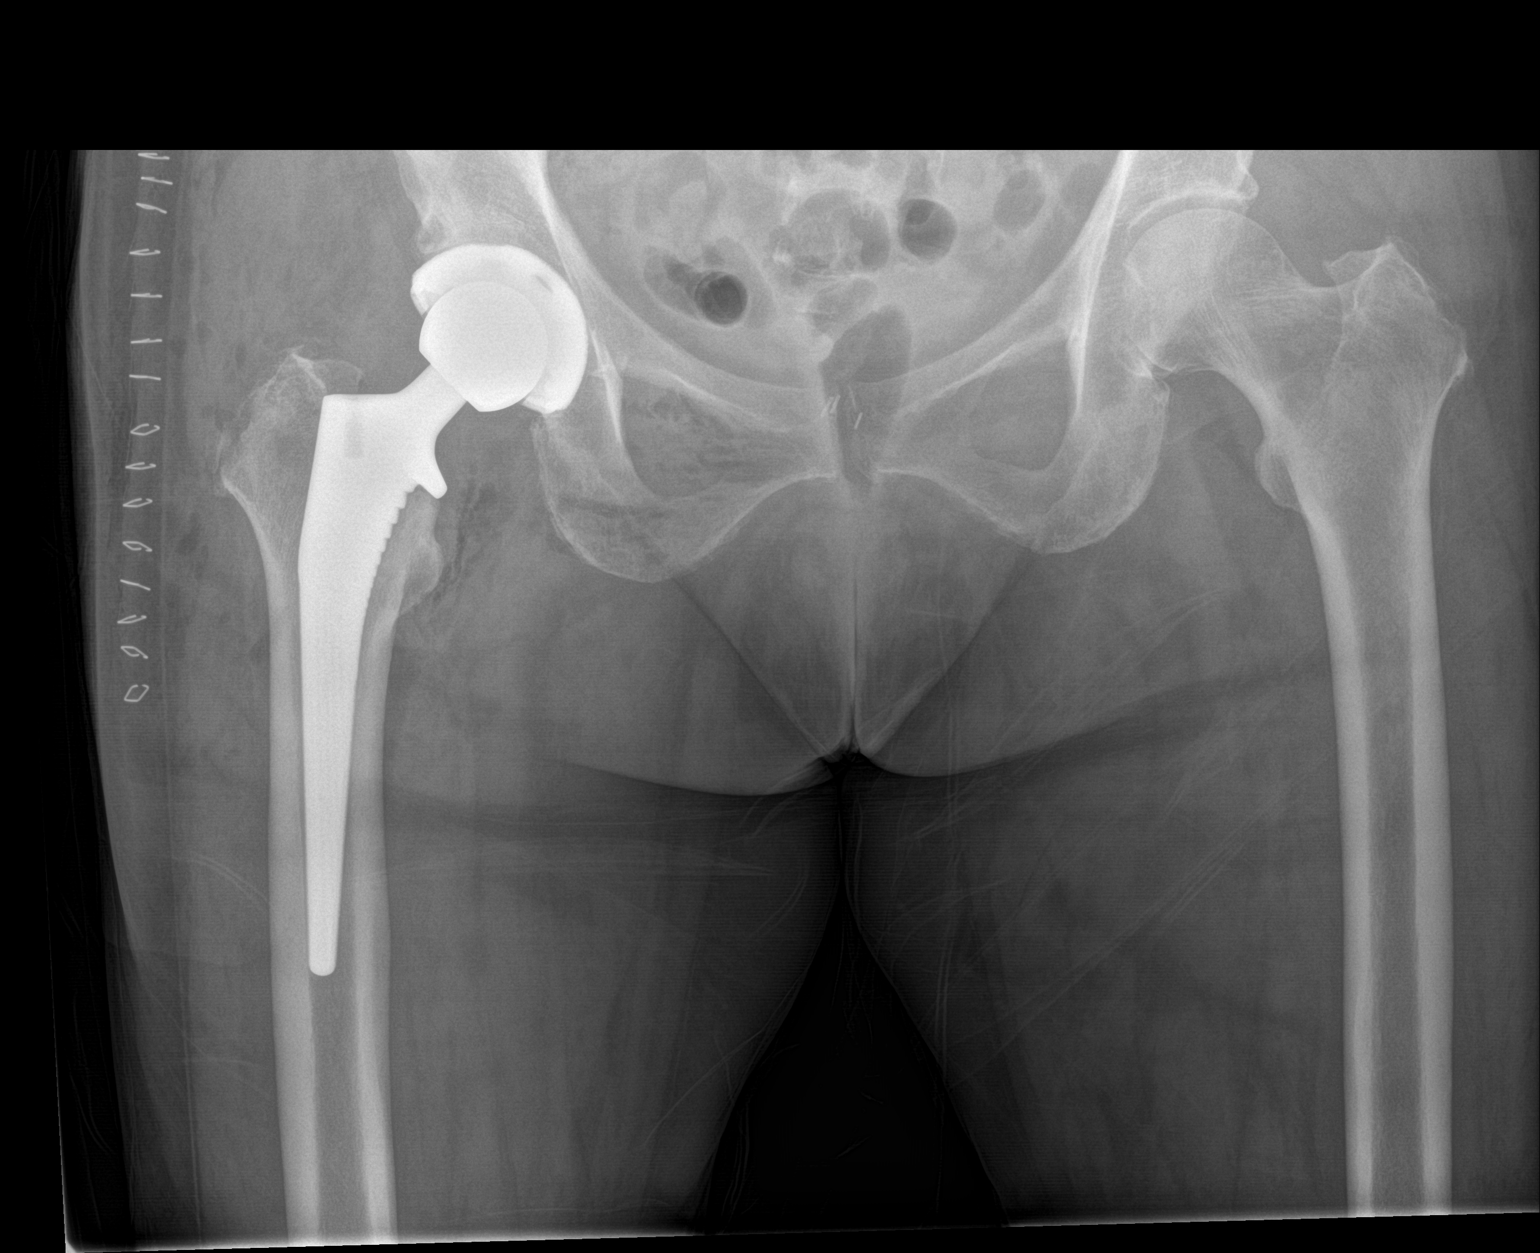

[1 of 1 positions shown; findings below may reference images not displayed]

FINDINGS: Frontal view of lower pelvis and hips obtained. There is a total hip
replacement on the right with prosthetic components on the right
well-seated on frontal view. No fracture or dislocation evident.
Left hip joint appears unremarkable. Acute postoperative changes
noted on the right.
IMPRESSION: Status post total hip replacement right with prosthetic components
appearing well-seated on frontal view. No fracture or dislocation.
Left hip joint appears unremarkable.

## 2022-05-10 DIAGNOSIS — Z961 Presence of intraocular lens: Secondary | ICD-10-CM | POA: Diagnosis not present

## 2022-05-10 DIAGNOSIS — E119 Type 2 diabetes mellitus without complications: Secondary | ICD-10-CM | POA: Diagnosis not present

## 2022-05-12 ENCOUNTER — Ambulatory Visit
Admission: RE | Admit: 2022-05-12 | Discharge: 2022-05-12 | Disposition: A | Discharge status: Home / Self Care | Payer: Medicare PPO | Source: Ambulatory Visit | Attending: Adult Health | Admitting: Adult Health

## 2022-05-12 DIAGNOSIS — Z1231 Encounter for screening mammogram for malignant neoplasm of breast: Secondary | ICD-10-CM

## 2022-06-22 ENCOUNTER — Ambulatory Visit: Payer: Medicare PPO | Admitting: Psychology

## 2022-06-22 ENCOUNTER — Encounter: Payer: Self-pay | Admitting: Psychology

## 2022-06-22 DIAGNOSIS — F02A0 Dementia in other diseases classified elsewhere, mild, without behavioral disturbance, psychotic disturbance, mood disturbance, and anxiety: Secondary | ICD-10-CM | POA: Diagnosis not present

## 2022-06-22 DIAGNOSIS — G309 Alzheimer's disease, unspecified: Secondary | ICD-10-CM

## 2022-06-22 DIAGNOSIS — F03A0 Unspecified dementia, mild, without behavioral disturbance, psychotic disturbance, mood disturbance, and anxiety: Secondary | ICD-10-CM | POA: Insufficient documentation

## 2022-06-22 DIAGNOSIS — R4189 Other symptoms and signs involving cognitive functions and awareness: Secondary | ICD-10-CM

## 2022-06-22 HISTORY — DX: Unspecified dementia, mild, without behavioral disturbance, psychotic disturbance, mood disturbance, and anxiety: F03.A0

## 2022-06-22 NOTE — Progress Notes (Signed)
   Psychometrician Note   Cognitive testing was administered to Jodi Summers by Wallace Keller, B.S. (psychometrist) under the supervision of Dr. Newman Nickels, Ph.D., licensed psychologist on 06/22/2022. Jodi Summers did not appear overtly distressed by the testing session per behavioral observation or responses across self-report questionnaires. Rest breaks were offered.    The battery of tests administered was selected by Dr. Newman Nickels, Ph.D. with consideration to Jodi Summers's current level of functioning, the nature of her symptoms, emotional and behavioral responses during interview, level of literacy, observed level of motivation/effort, and the nature of the referral question. This battery was communicated to the psychometrist. Communication between Dr. Newman Nickels, Ph.D. and the psychometrist was ongoing throughout the evaluation and Dr. Newman Nickels, Ph.D. was immediately accessible at all times. Dr. Newman Nickels, Ph.D. provided supervision to the psychometrist on the date of this service to the extent necessary to assure the quality of all services provided.    Jodi Summers will return within approximately 1-2 weeks for an interactive feedback session with Dr. Milbert Coulter at which time her test performances, clinical impressions, and treatment recommendations will be reviewed in detail. Jodi Summers understands she can contact our office should she require our assistance before this time.  A total of 120 minutes of billable time were spent face-to-face with Jodi Summers by the psychometrist. This includes both test administration and scoring time. Billing for these services is reflected in the clinical report generated by Dr. Newman Nickels, Ph.D.  This note reflects time spent with the psychometrician and does not include test scores or any clinical interpretations made by Dr. Milbert Coulter. The full report will follow in a separate note.

## 2022-06-22 NOTE — Progress Notes (Unsigned)
NEUROPSYCHOLOGICAL EVALUATION [place]. [facility] Department of Neurology  Date of Evaluation: Jun 22, 2022  Reason for Referral:   AIMIE MASIN is a 82 y.o. right-handed Caucasian female referred by Marlowe Kays, PA-C, to characterize her current cognitive functioning and assist with diagnostic clarity and treatment planning in the context of prior concerns for Alzheimer's disease and progressive cognitive decline.   Assessment and Plan:   Clinical Impression(s): Ms. Nourse pattern of performance is suggestive of a primary impairment surrounding all aspects of learning and memory. An additional impairment was exhibited across cognitive flexibility, while relative weaknesses were exhibited across processing speed and semantic fluency. Variability was seen across confrontation naming. Performances were appropriate relative to age-matched peers across attention/concentration, receptive language, phonemic fluency, and visuospatial abilities. Functionally, Ms. Juhnke has moved into an assisted living facility and her caretakers and or family members have fully taken over medication, financial, and bill paying responsibilities. She also no longer drives. 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 However, I do feel that she remains towards the milder end of this spectrum.   Relative to her previous evaluation in April 2023, ***.   I continue to have primary concerns surrounding an underlying neurodegenerative illness, namely Alzheimer's disease. Across memory testing, retention rates ranged from 0% to 50% and she did exhibit some weaker performances across recognition trials. This continues to suggest evidence for rapid forgetting and an evolving storage impairment, both of which are the hallmark characteristics of this illness. These testing patterns continue to align with her and  her daughter's description of worsening repetition and short-term memory loss in her day-to-day life. Additional weakness surrounding semantic fluency, confrontation naming, and cognitive flexibility do follow fairly traditional disease progression, further escalating concerns.   While there may be a mild vascular contribution to her presentation given past neuroimaging suggesting a small remote right parietal lobe infarct, this would not explain progressively worsening memory impairment. I also continue to feel it very unlikely that her 2016 concussion is playing any role in her current degree of functioning. Continued medical monitoring will be important moving forward.   Recommendations: Ms. Defreece has already been prescribed a medication aimed to address memory loss and concerns surrounding Alzheimer's disease (i.e., rivastigmine/Exelon). She is encouraged to continue taking this medication as prescribed. It is important to highlight that this medication has been shown to slow functional decline in some individuals. There is no current treatment which can stop or reverse cognitive decline when caused by a neurodegenerative illness.   It will be important for Ms. Ore to have another person with her when in situations where she may need to process information, weigh the pros and cons of different options, and make decisions, in order to ensure that she fully understands and recalls all information to be considered.  If not already done, Ms. Demain and her family may want to discuss her wishes regarding durable power of attorney and medical decision making, so that she can have input into these choices. If they require legal assistance with this, long-term care resource access, or other aspects of estate planning, they could reach out to [employer] at [phone] for a free consultation. Additionally, they may wish to discuss future plans for caretaking and seek out community options for in  home/residential care should they become necessary.  Ms. Kamienski is encouraged to attend to lifestyle factors for brain health (e.g., regular physical exercise,  good nutrition habits and consideration of the MIND-DASH diet, regular participation in cognitively-stimulating activities, and general stress management techniques), which are likely to have benefits for both emotional adjustment and cognition. Optimal control of vascular risk factors (including safe cardiovascular exercise and adherence to dietary recommendations) is encouraged. Continued participation in activities which provide mental stimulation and social interaction is also recommended.   Important information should be provided to Ms. Omara in written format in all instances. This information should be placed in a highly frequented and easily visible location within her home to promote recall. External strategies such as written notes in a consistently used memory journal, visual and nonverbal auditory cues such as a calendar on the refrigerator or appointments with alarm, such as on a cell phone, can also help maximize recall.  To address problems with processing speed, she may wish to consider:   -Ensuring that she is alerted when essential material or instructions are being presented   -Adjusting the speed at which new information is presented   -Allowing for more time in comprehending, processing, and responding in conversation   -Repeating and paraphrasing instructions or conversations aloud  To address problems with fluctuating attention and/or executive dysfunction, she may wish to consider:   -Avoiding external distractions when needing to concentrate   -Limiting exposure to fast paced environments with multiple sensory demands   -Writing down complicated information and using checklists   -Attempting and completing one task at a time (i.e., no multi-tasking)   -Verbalizing aloud each step of a task to maintain focus    -Taking frequent breaks during the completion of steps/tasks to avoid fatigue   -Reducing the amount of information considered at one time   -Scheduling more difficult activities for a time of day where she is usually most alert  Review of Records:   Ms. Faris completed a comprehensive neuropsychological evaluation Orie Fisherman, Ph.D.) on 05/27/2020. Results suggested average (if not above average to superior) range functioning across most measures of learning and memory, language, attention and processing speed, visuospatial abilities, and executive functions. Low scores of unclear significance were noted across two tasks (i.e., object naming and visual free recall). Per Dr. Jacquelyne Balint, these very well may be incidental and were at that point thought to be subclinical. From an emotional standpoint, there were no clear indications of significant clinical psychopathology. Ms. Bonder' performance did not suggest evidence for any major neurocognitive challenges and her performance did not warrant a cognitive diagnosis at that time.   Ms. Avance was seen by [facility] Marlowe Kays, PA-C) on 03/04/2021 for an evaluation of memory loss. During the past 6-8 months, her daughter described worsening short-term memory with Ms. Halsey repeating herself or asking repetitive questions. Sleep has also been disrupted during this time. Ms. Quartey moved from [place] during the COVID-19 pandemic and into [facility]. She described this as an "exhausting experience." Her daughter manages finances and medications are organized via a pillbox. There was mention of a remote head injury in October 2016 after falling and hitting her head while in the parking lot at church. Performance on a brief cognitive screening instrument (MOCA) was 25/30. Ultimately, Ms. Halligan was referred for a comprehensive neuropsychological evaluation to characterize her cognitive abilities and to assist with diagnostic  clarity and treatment planning.   She completed a comprehensive neuropsychological evaluation with myself 06/11/2021. Results suggested impairment across encoding (i.e., learning) and retrieval aspects of memory, with the latter domain representing greater dysfunction. Performance variability was exhibited across  confrontation naming, semantic fluency, and recognition/consolidation aspects of memory. Relative to her previous evaluation in April 2022, decline was seen across both encoding and retrieval aspects of memory, as well as semantic fluency. Given amnestic memory performances, concerns were expressed for early stages of Alzheimer's disease. 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 Repeat testing was recommended.   She was most recently seen by Ms. Wertman on 01/13/2022 for follow-up. She had been placed on the rivastigmine patch in the interim after having a poor reaction to donepezil. Cognitive difficulties were said to be stable. She had moved into [facility] to help manage ADLs. Ultimately, Ms. Akhter was referred for a comprehensive neuropsychological evaluation to characterize her cognitive abilities and to assist with diagnostic clarity and treatment planning.    Brain MRI on 08/31/2014 was motion degraded. Results suggested volume loss mildly advanced for age and mild chronic small vessel ischemic changes. Brain MRI on 03/24/2021 revealed a suspected tiny chronic cortical infarct within the right parietal lobe, mild and stable chronic small vessel ischemic changes, and mild to moderate generalized cerebral atrophy.   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   History of COVID-19 2024   History of hysterectomy 03/18/2021   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   Hyperlipidemia 11/30/2018   Hyponatremia 09/01/2021   Knee pain 02/22/2017   Lactic acidosis 09/01/2021   Leukocytosis 09/01/2021   Lichen sclerosus 03/18/2021   Low back pain 02/22/2017   Malignant neoplasm of upper-outer quadrant of right female breast 1999   Orthostatic hypotension 09/01/2021   Pain in toe 12/06/2019   Personal history of chemotherapy    Personal history of radiation therapy    Right wrist sprain 09/01/2021   Syncope and collapse 09/01/2021   Unilateral primary osteoarthritis, 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    Past Surgical History:  Procedure Laterality Date   ABDOMINAL HYSTERECTOMY  1998   fibroid   BREAST LUMPECTOMY Right 1999   CHOLECYSTECTOMY     JOINT REPLACEMENT     L knee replacement   TONSILLECTOMY     TOTAL HIP ARTHROPLASTY Right 10/26/2019   Procedure: RIGHT TOTAL HIP ARTHROPLASTY ANTERIOR APPROACH;  Surgeon: Kathryne Hitch, MD;  Location: [facility];  Service: Orthopedics;  Laterality: Right;   WISDOM TOOTH EXTRACTION      Current Outpatient Medications:    ARIPiprazole (ABILIFY) 2 MG tablet, Take 1 tablet (2 mg total) by mouth daily., Disp: 7 tablet, Rfl: 0   buPROPion (WELLBUTRIN XL) 150 MG 24 hr tablet, Take 150 mg by mouth daily., Disp: , Rfl:    clobetasol ointment (TEMOVATE) 0.05 %, Apply 1 Application topically 2 (two) times daily., Disp: , Rfl:    lactose free nutrition (BOOST) LIQD, Take 237 mLs by mouth every morning., Disp: , Rfl:    melatonin 1 MG TABS tablet, Take 1 mg by mouth at bedtime., Disp: , Rfl:    metFORMIN (GLUCOPHAGE) 500 MG tablet, Take 0.5 tablets (250 mg total) by mouth 2 (two) times daily with a meal., Disp: 180 tablet, Rfl: 3   rivastigmine (EXELON) 4.6  mg/24hr, Place 4.6 mg onto the  skin daily., Disp: , Rfl:    sertraline (ZOLOFT) 50 MG tablet, Take 25 mg by mouth at bedtime., Disp: , Rfl:   Clinical Interview:   The following information was obtained during a clinical interview with Ms. Hardester and her daughter prior to cognitive testing.  Cognitive Symptoms: Decreased short-term memory: Endorsed. Ms. Boy previously described trouble recalling details of past conversations and upcoming appointments. She noted that these difficulties had persisted and seemed mildly worse since her previous evaluation in April 2023. Her daughter previously noted that memory decline has been occurring for the past several years. She also observed decline since her mother's previous evaluation, especially surrounding Ms. Byerly' frequently misplacing/losing things and asking repetitive questions. Decreased long-term memory: Denied. Decreased attention/concentration: Denied. Reduced processing speed: Denied. Difficulties with executive functions: Denied. Her daughter noted that organization/multi-tasking had "never been a strong suit" but also did not describe any noticeable decline. No trouble with impulsivity or any severe personality changes were described.  Difficulties with emotion regulation: Denied. Difficulties with receptive language: Denied. Difficulties with word finding: Denied. Decreased visuoperceptual ability: Denied.  Difficulties completing ADLs: Endorsed. Since moving into an assisted living facility, Ms. Greever has benefited from additional assistance with medication management and monitoring. Her family had fully taken over financial management and bill paying at the time of her previous evaluation. She also no longer drives, stating that she sold her car once moving into [facility]. Prior to this, there was at least one instance where Ms. Ravitz had called her daughter expressing concern that she was lost and unsure of her surroundings.   Additional Medical  History: History of traumatic brain injury/concussion: Endorsed. She previously reported falling in a church parking lot, striking her head, and experiencing a brief loss in consciousness (per medical records, this took place in October 2016). The posttraumatic amnesia period lasted less than four hours and there was no report of significant retroactive amnesia. She purportedly presented to the emergency department and was diagnosed with a "brain bleed." Neuroimaging surrounding this event was unable to review. No other historical or more recent events were described.  History of stroke: A recent brain MRI revealed a suspected tiny chronic cortical infarct within the right parietal lobe, said to have occurred between her 2016 scan and her 2023 scan.  History of seizure activity: Denied. History of known exposure to toxins: Denied. Symptoms of chronic pain: Denied. Experience of frequent headaches/migraines: Denied. Frequent instances of dizziness/vertigo: Denied.   Sensory changes: Denied. However, her daughter reminded her that she has hearing loss and has been prescribed hearing aids. However, Ms. Sitzman often does not wear these devices. Other sensory changes/difficulties (e.g., vision, taste, or smell) were currently denied.  Balance/coordination difficulties: Somewhat. Her daughter reported a string of falls in 2023, with perhaps her most significant occurring in July. It was suspected that these were caused by sudden drops in her blood pressure. Dehydration or other nutritional imbalances may also have been contributory. No head injuries were described.  Other motor difficulties: Denied.  Sleep History: Estimated hours obtained each night: 6 hours.  Difficulties falling asleep: Endorsed. She previously described some nights where she has "trouble turning off my mind when I get into bed." Her daughter reminded her of taking melatonin in these instances. Difficulties staying asleep:  Denied. Feels rested and refreshed upon awakening: Endorsed.    History of snoring: Denied. History of waking up gasping for air: Denied. Witnessed breath cessation while asleep: Denied.  History of vivid dreaming: Denied. Excessive movement while asleep: Denied. Instances of acting out her dreams: Denied.  Psychiatric/Behavioral Health History: Depression: She previously acknowledged a longstanding history of "off and on," generally mild depressive symptoms. She described her current mood as "good." Her daughter was in agreement with this, noting mood improvement since getting her settled at [facility]. Current or remote suicidal ideation, intent, or plan was denied.  Anxiety: Denied. Her daughter did previously describe Ms. Hovater as always having some "nervous energy." However there was no mention of a formal anxiety disorder diagnosis in the past.  Mania: Denied. Trauma History: Denied. Visual/auditory hallucinations: Denied. Delusional thoughts: Denied.   Tobacco: Denied. Alcohol: She denied current alcohol consumption as well as a history of problematic alcohol abuse or dependence.  Recreational drugs: Denied.  Family History: Problem Relation Age of Onset   Alzheimer's disease Mother    Alzheimer's disease Sister    This information was confirmed by Ms. Sabatino.  Academic/Vocational History: Highest level of educational attainment: 16 years. She earned a Oncologist in Tax inspector from [employer]. She described herself as a good (A/B) student in academic settings. Math was noted as a relative weakness.  History of developmental delay: Denied. History of grade repetition: Denied. Enrollment in special education courses: Denied. History of LD/ADHD: Denied.   Employment: Retired. She previously worked as an Tourist information centre manager, taught piano, and was heavily involved in several non-profits in the greater [place] area prior to her move to [place].    Evaluation Results:   Behavioral Observations: Ms. Chiba was accompanied by her daughter, arrived to her appointment on time, and was appropriately dressed and groomed. She appeared alert. Observed gait and station were within normal limits. Gross motor functioning appeared intact upon informal observation and no abnormal movements (e.g., tremors) were noted. Her affect was generally relaxed and positive. Spontaneous speech was fluent and word finding difficulties were not observed during the clinical interview. Thought processes were coherent, organized, and normal in content. Insight into her cognitive difficulties appeared largely adequate.   During testing, hearing loss was apparent and instructions were often repeated to ensure adequate comprehension. Sustained attention was appropriate. Task engagement was adequate and she persisted when challenged. She fatigued as the evaluation progressed. It was mildly abbreviated in response. Overall, Ms. Dakin was cooperative with the clinical interview and subsequent testing procedures.   Adequacy of Effort: The validity of neuropsychological testing is limited by the extent to which the individual being tested may be assumed to have exerted adequate effort during testing. Ms. Stokke expressed her intention to perform to the best of her abilities and exhibited adequate task engagement and persistence. Scores across stand-alone and embedded performance validity measures were within expectation. As such, the results of the current evaluation are believed to be a valid representation of Ms. Faries' current cognitive functioning.  Test Results: Ms. Zittle was poorly oriented at the time of the current evaluation. She was unable to state her full name ("Serina Milda Smart Dillard..."), was unsure of her age 82 or 21"), and was unable to provide a street address. She was also unable to state the current year, month, date, day of the week, time, or name  of the current clinic.   Intellectual abilities based upon educational and vocational attainment were estimated to be in the average range. Premorbid abilities were estimated to be within the above average range based upon a single-word reading test.   Processing speed was exceptionally low to below average.  Basic attention was well above average to exceptionally high. More complex attention (e.g., working memory) was below average to average. Cognitive flexibility was exceptionally low. Other aspects of executive functioning were unable to be assessed due to increasing fatigue.  Assessed receptive language abilities were above average. Likewise, Ms. Supinger did not exhibit any difficulties comprehending task instructions and answered all questions asked of her appropriately during interview. Assessed expressive language was variable. Sentence repetition was exceptionally low, potentially impacted by both hearing impairments and memory dysfunction. Phonemic fluency was above average, semantic fluency was well below average to below average, and confrontation naming was average across a screening task but well below average across a more comprehensive task.   Assessed visuospatial/visuoconstructional abilities were average to well above average. Points were lost on her drawing of a clock due to mild spacing errors with numerical placement, as well as incorrect hand placement.    Learning (i.e., encoding) of novel verbal information was exceptionally low to below average. Spontaneous delayed recall (i.e., retrieval) of previously learned information was also exceptionally low to below average. Retention rates were 50% (raw score of 2) across a story learning task, 33% across a list learning task, and 0% across a figure drawing task. Performance across recognition tasks was exceptionally low to below average, suggesting minimal evidence for information consolidation.   Results of emotional screening  instruments suggested that recent symptoms of generalized anxiety were in the minimal range, while symptoms of depression were within normal limits. A screening instrument assessing recent sleep quality suggested the presence of minimal sleep dysfunction.  Tables of Scores:     Informed Consent and Coding/Compliance:   The current evaluation represents a clinical evaluation for the purposes previously outlined by the referral source and is in no way reflective of a forensic evaluation.   Ms. Shearon was provided with a verbal description of the nature and purpose of the present neuropsychological evaluation. Also reviewed were the foreseeable risks and/or discomforts and benefits of the procedure, limits of confidentiality, and mandatory reporting requirements of this provider. The patient was given the opportunity to ask questions and receive answers about the evaluation. Oral consent to participate was provided by the patient.   This evaluation was conducted by Newman Nickels, Ph.D., ABPP-CN, board certified clinical neuropsychologist. Ms. Hovde completed a clinical interview with Dr. Milbert Coulter, billed as one unit [phone], and 120 minutes of cognitive testing and scoring, billed as one unit [phone] and three additional units [id number]. Psychometrist Wallace Keller, B.S., assisted Dr. Milbert Coulter with test administration and scoring procedures. As a separate and discrete service, one unit [id number] and two units [phone] were billed for Dr. Tammy Sours time spent in interpretation and report writing.

## 2022-06-23 ENCOUNTER — Encounter: Payer: Self-pay | Admitting: Psychology

## 2022-06-29 ENCOUNTER — Ambulatory Visit: Payer: Medicare PPO | Admitting: Psychology

## 2022-06-29 DIAGNOSIS — G309 Alzheimer's disease, unspecified: Secondary | ICD-10-CM | POA: Diagnosis not present

## 2022-06-29 DIAGNOSIS — F02A0 Dementia in other diseases classified elsewhere, mild, without behavioral disturbance, psychotic disturbance, mood disturbance, and anxiety: Secondary | ICD-10-CM | POA: Diagnosis not present

## 2022-06-29 NOTE — Progress Notes (Signed)
   Neuropsychology Feedback Session Jodi Summers. [facility] [place] Department of Neurology  Reason for Referral:   Jodi Summers is a 82 y.o. right-handed Caucasian female referred by Marlowe Kays, PA-C, to characterize her current cognitive functioning and assist with diagnostic clarity and treatment planning in the context of prior concerns for Alzheimer's disease and progressive cognitive decline.   Feedback:   [name] completed a comprehensive neuropsychological evaluation on 06/22/2022. Please refer to that encounter for the full report and recommendations. Briefly, results suggested a primary impairment surrounding all aspects of learning and memory. An additional impairment was exhibited across cognitive flexibility, while relative weaknesses were exhibited across processing speed and semantic fluency. Variability was seen across confrontation naming. Relative to her previous evaluation in April 2023, mild decline was exhibited across aspects of learning and memory. Mild declines were also exhibited across processing speed, complex attention, cognitive flexibility, sentence repetition, and semantic fluency. Areas of decline do appear somewhat more pronounced when compared to testing performed in April 2022. I continue to have primary concerns surrounding an underlying neurodegenerative illness, namely Alzheimer's disease. Across memory testing, retention rates ranged from 0% to 50% and she did exhibit some weaker performances across recognition trials. This continues to suggest evidence for rapid forgetting and an evolving storage impairment, both of which are the hallmark characteristics of this illness. These testing patterns continue to align with her and her daughter's description of worsening repetition and short-term memory loss in her day-to-day life. Additional weakness surrounding semantic fluency, confrontation naming, and cognitive flexibility do follow fairly traditional  disease progression, further escalating concerns.   [name] was accompanied by her daughter during the current feedback session. Content of the current session focused on the results of her neuropsychological evaluation. [name] was given the opportunity to ask questions and her questions were answered. She was encouraged to reach out should additional questions arise. A copy of her report was provided at the conclusion of the visit.      One unit [phone] was billed for Dr. Tammy Sours time spent preparing for, conducting, and documenting the current feedback session with [name].

## 2022-07-06 ENCOUNTER — Encounter: Payer: Medicare PPO | Admitting: Internal Medicine

## 2022-07-13 ENCOUNTER — Encounter: Payer: Self-pay | Admitting: Internal Medicine

## 2022-07-13 ENCOUNTER — Non-Acute Institutional Stay: Payer: Medicare PPO | Admitting: Internal Medicine

## 2022-07-13 VITALS — BP 128/76 | HR 90 | Temp 97.8°F | Resp 17 | Ht 65.0 in | Wt 152.8 lb

## 2022-07-13 DIAGNOSIS — E118 Type 2 diabetes mellitus with unspecified complications: Secondary | ICD-10-CM

## 2022-07-13 DIAGNOSIS — F4321 Adjustment disorder with depressed mood: Secondary | ICD-10-CM | POA: Diagnosis not present

## 2022-07-13 DIAGNOSIS — G4719 Other hypersomnia: Secondary | ICD-10-CM

## 2022-07-13 DIAGNOSIS — F02B3 Dementia in other diseases classified elsewhere, moderate, with mood disturbance: Secondary | ICD-10-CM | POA: Diagnosis not present

## 2022-07-13 DIAGNOSIS — G301 Alzheimer's disease with late onset: Secondary | ICD-10-CM

## 2022-07-13 NOTE — Progress Notes (Unsigned)
Location:  [facility] features editor of Service:  Clinic (12)  Provider:   Code Status:  Goals of Care:     07/13/2022   11:10 AM  Advanced Directives  Does Patient Have a Medical Advance Directive? Yes  Type of Estate agent of [place];Living will  Does patient want to make changes to medical advance directive? No - Patient declined     Chief Complaint  Patient presents with   Medical Management of Chronic Issues    4 month follow up. Patient would like to discuss getting up   Immunizations    Discuss the need for covid, shingles and tdap vaccine    Health Maintenance    Patient is due for diabetic kidney evaluation, hep c screening,     HPI: Patient is a 82 y.o. female seen today for medical management of chronic diseases.   [facility]   She has h/o HLD Did not tolerate Statin H/o Breast Cancer s/p Right Mastectomy S/P Right hip Arthroplasty  in 9/21 H/o Intraventricular Hemorrhage in 10/16 after a fall Diabetes Type 2  Depression Insomnia  Dementia Alzheimer's dementia last MMSE 25 out of 30  Per nurses patient continues to sleep through the morning sometimes does not get out of her room till 3:00.  Does not get dressed and needs a lot of cueing her daughter who is really involved comes and helps her. Patient has lost some weight. Patient herself denies any nausea vomiting abdominal pain  she denies any depression is not having any paranoid behaviors  has not had any falls. 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   History of COVID-19 2024   History of hysterectomy 03/18/2021   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   Hyperlipidemia 11/30/2018   Hyponatremia 09/01/2021   Knee pain 02/22/2017   Lactic acidosis 09/01/2021   Leukocytosis 09/01/2021   Lichen sclerosus 03/18/2021   Low back pain 02/22/2017   Malignant neoplasm of upper-outer quadrant of right female breast 1999   Mild dementia, concerns for Alzheimer's disease 06/22/2022   Orthostatic hypotension 09/01/2021   Pain in toe 12/06/2019   Personal history of chemotherapy    Personal history of radiation therapy    Right wrist sprain 09/01/2021   Syncope and collapse 09/01/2021   Unilateral primary osteoarthritis, 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   buPROPion (WELLBUTRIN XL) 150 MG 24 hr tablet Take 150 mg by mouth daily.   clobetasol ointment (TEMOVATE) 0.05 % Apply 1 Application topically as needed.   lactose free nutrition (BOOST) LIQD Take 237 mLs by mouth every morning.   melatonin 5 MG TABS Take 5 mg by mouth at bedtime as needed.   metFORMIN (GLUCOPHAGE) 500 MG tablet Take 0.5 tablets (250 mg total) by mouth 2 (two) times daily with a meal.   rivastigmine (EXELON) 4.6 mg/24hr Place 4.6 mg onto the  skin daily.   sertraline (ZOLOFT) 50 MG tablet Take 25 mg by mouth at bedtime.   [DISCONTINUED] melatonin 1 MG TABS tablet Take 1 mg by mouth at bedtime.   No facility-administered encounter medications on file as of 07/13/2022.    Review of Systems:  Review of Systems  Constitutional:  Positive for appetite change. Negative for activity change.  HENT: Negative.    Respiratory:  Negative for cough and shortness of breath.   Cardiovascular:  Negative for leg swelling.  Gastrointestinal:  Negative for constipation.  Genitourinary: Negative.   Musculoskeletal:  Negative for arthralgias, gait problem and myalgias.  Skin: Negative.   Neurological:  Negative for dizziness and weakness.  Psychiatric/Behavioral:  Positive for confusion and sleep disturbance. Negative for dysphoric mood.     Health Maintenance  Topic Date Due   Medicare Annual Wellness (AWV)  Never done   Diabetic kidney evaluation - Urine ACR  Never done   Hepatitis C Screening  Never done   DTaP/Tdap/Td (1 - Tdap) Never done   Zoster Vaccines- Shingrix (1 of 2) Never done   DEXA SCAN  Never done   COVID-19 Vaccine (4 - 2023-24 season) 02/15/2022   OPHTHALMOLOGY EXAM  07/13/2022 (Originally 12/10/1950)   HEMOGLOBIN A1C  09/02/2022   INFLUENZA VACCINE  09/16/2022   FOOT EXAM  12/02/2022   Pneumonia Vaccine 21+ Years old (2 of 2 - PCV) 12/04/2022   Diabetic kidney evaluation - eGFR measurement  03/05/2023   HPV VACCINES  Aged Out    Physical Exam: Vitals:   07/13/22 1105  BP: 128/76  Pulse: 90  Resp: 17  Temp: 97.8 F (36.6 C)  TempSrc: Temporal  SpO2: 96%  Weight: 152 lb 12.8 oz (69.3 kg)  Height: 5\' 5"  (1.651 m)   Body mass index is 25.43 kg/m. Physical Exam Vitals reviewed.  Constitutional:      Appearance: Normal appearance.  HENT:     Head: Normocephalic.     Nose: Nose normal.     Mouth/Throat:     Mouth: Mucous membranes are moist.     Pharynx: Oropharynx is clear.  Eyes:     Pupils:  Pupils are equal, round, and reactive to light.  Cardiovascular:     Rate and Rhythm: Normal rate and regular rhythm.     Pulses: Normal pulses.     Heart sounds: Normal heart sounds. No murmur heard. Pulmonary:     Effort: Pulmonary effort is normal.     Breath sounds: Normal breath sounds.  Abdominal:     General: Abdomen is flat. Bowel sounds are normal.     Palpations: Abdomen is soft.  Musculoskeletal:        General: No swelling.     Cervical back: Neck supple.  Skin:    General: Skin is warm.  Neurological:  General: No focal deficit present.     Mental Status: She is alert.  Psychiatric:        Mood and Affect: Mood normal.        Thought Content: Thought content normal.     Labs reviewed: Basic Metabolic Panel: Recent Labs    08/31/21 2312 09/01/21 0813 09/02/21 0042 09/08/21 0000 09/28/21 1615 10/05/21 0000 03/04/22 0000  NA 130* 134* 132*   < > 136 133* 135*  K 3.6 3.5 3.4*   < > 2.8* 4.5 4.1  CL 94* 98 98   < > 109 98* 100  CO2 25 24 25    < > 22 24* 22  GLUCOSE 167* 160* 118*  --  118*  --   --   BUN 6* 7* 5*   < > 9 8 11   CREATININE 0.85 0.71 0.63   < > 0.59 0.7 0.6  CALCIUM 8.8* 9.1 8.9   < > 7.4* 9.5 9.3  TSH 2.192  --   --   --   --   --  1.82   < > = values in this interval not displayed.   Liver Function Tests: Recent Labs    08/31/21 2312 09/02/21 0042 10/05/21 0000 03/04/22 0000  AST 33 25 33 26  ALT 45* 35 36* 26  ALKPHOS 59 55 74 69  BILITOT 0.2* 0.9  --   --   PROT 6.9 6.2*  --   --   ALBUMIN 3.8 3.5 4.2 3.9   No results for input(s): "LIPASE", "AMYLASE" in the last 8760 hours. No results for input(s): "AMMONIA" in the last 8760 hours. CBC: Recent Labs    08/31/21 2312 09/01/21 0631 09/02/21 0245 09/08/21 0000 09/28/21 1615 03/04/22 0000  WBC 12.6* 12.9* 8.5 8.3 8.1 7.8  NEUTROABS 10.0* 10.1*  --   --   --   --   HGB 12.5 14.4 11.9* 13.6 13.3 13.4  HCT 36.8 40.9 34.1* 39 39.5 39  MCV 90.2 88.3 88.6  --  91.4  --    PLT 238 226 194 138* 224 242   Lipid Panel: Recent Labs    03/04/22 0000  CHOL 201*  HDL 54  LDLCALC 133  TRIG 284   Lab Results  Component Value Date   HGBA1C 6.7 03/04/2022    Procedures since last visit: No results found.  Assessment/Plan 1. 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 Controlled type 2 diabetes mellitus with complication, without long-term current use of insulin (HCC) On Metformin Repeat A1C  3. Situational depression Does not seemed Depressed On Wellbutrin and Zoloft Also on Low dose of Abilify Will Like to take her off Abilify next visit as Paranoid behaviors are improved 4 Excessive sleepiness Not sure if it is due to Cognition Patient denies Depression Will reduce her Melatonin to 1 mg for now Also repeat Labs  5 HLD Needs statin     Labs/tests ordered: CBC,CMP,A1C Next appt:  08/03/2022

## 2022-07-15 DIAGNOSIS — E119 Type 2 diabetes mellitus without complications: Secondary | ICD-10-CM | POA: Diagnosis not present

## 2022-07-15 LAB — BASIC METABOLIC PANEL
BUN: 9 (ref 4–21)
BUN: 9 (ref 4–21)
CO2: 23 — AB (ref 13–22)
CO2: 23 — AB (ref 13–22)
Chloride: 103 (ref 99–108)
Chloride: 103 (ref 99–108)
Creatinine: 0.7 (ref 0.5–1.1)
Creatinine: 0.7 (ref 0.5–1.1)
Glucose: 126
Glucose: 126
Potassium: 4.2 mEq/L (ref 3.5–5.1)
Potassium: 4.2 mEq/L (ref 3.5–5.1)
Sodium: 138 (ref 137–147)
Sodium: 138 (ref 137–147)

## 2022-07-15 LAB — HEPATIC FUNCTION PANEL
ALT: 22 U/L (ref 7–35)
ALT: 22 U/L (ref 7–35)
AST: 24 (ref 13–35)
AST: 24 (ref 13–35)
Alkaline Phosphatase: 62 (ref 25–125)
Alkaline Phosphatase: 62 (ref 25–125)
Bilirubin, Total: 0.2

## 2022-07-15 LAB — CBC AND DIFFERENTIAL
HCT: 39 (ref 36–46)
HCT: 39 (ref 36–46)
Hemoglobin: 13.2 (ref 12.0–16.0)
Hemoglobin: 13.2 (ref 12.0–16.0)
Platelets: 114 10*3/uL — AB (ref 150–400)
Platelets: 114 10*3/uL — AB (ref 150–400)
WBC: 9.6
WBC: 9.6

## 2022-07-15 LAB — COMPREHENSIVE METABOLIC PANEL
Albumin: 4 (ref 3.5–5.0)
Albumin: 4 (ref 3.5–5.0)
Calcium: 9.4 (ref 8.7–10.7)
Calcium: 9.4 (ref 8.7–10.7)
Globulin: 2.5
Globulin: 2.5
eGFR: 88
eGFR: 88

## 2022-07-15 LAB — HEMOGLOBIN A1C: Hemoglobin A1C: 6.4

## 2022-07-15 LAB — CBC
RBC: 4.33 (ref 3.87–5.11)
RBC: 4.33 (ref 3.87–5.11)

## 2022-07-22 ENCOUNTER — Encounter: Payer: Self-pay | Admitting: Physician Assistant

## 2022-07-22 ENCOUNTER — Ambulatory Visit: Payer: Medicare PPO | Admitting: Physician Assistant

## 2022-07-22 VITALS — BP 136/75 | HR 92 | Resp 18 | Ht 65.0 in | Wt 153.0 lb

## 2022-07-22 DIAGNOSIS — G309 Alzheimer's disease, unspecified: Secondary | ICD-10-CM | POA: Diagnosis not present

## 2022-07-22 DIAGNOSIS — F02A0 Dementia in other diseases classified elsewhere, mild, without behavioral disturbance, psychotic disturbance, mood disturbance, and anxiety: Secondary | ICD-10-CM

## 2022-07-22 NOTE — Patient Instructions (Addendum)
It was a pleasure to see you today at our office.   Recommendations:  Follow up in Dec 10 11:30  Continue rivastigmine 9.5  mg patch .  Continue the other medicines as prescribed       Whom to call:  Memory  decline, memory medications: Call out office [phone]   For psychiatric meds, mood meds: Please have your primary care physician manage these medications.     For assessment of decision of mental capacity and competency:  Call Dr. Erick Blinks, geriatric psychiatrist at [phone]  For guidance in geriatric dementia issues please call [facility] [phone]   If you have any severe symptoms of a stroke, or other severe issues such as confusion,severe chills or fever, etc call 911 or go to the ER as you may need to be evaluate further     RECOMMENDATIONS FOR ALL PATIENTS WITH MEMORY PROBLEMS: 1. Continue to exercise (Recommend 30 minutes of walking everyday, or 3 hours every week) 2. Increase social interactions - continue going to [place] and enjoy social gatherings with friends and family 3. Eat healthy, avoid fried foods and eat more fruits and vegetables 4. Maintain adequate blood pressure, blood sugar, and blood cholesterol level. Reducing the risk of stroke and cardiovascular disease also helps promoting better memory. 5. Avoid stressful situations. Live a simple life and avoid aggravations. Organize your time and prepare for the next day in anticipation. 6. Sleep well, avoid any interruptions of sleep and avoid any distractions in the bedroom that may interfere with adequate sleep quality 7. Avoid sugar, avoid sweets as there is a strong link between excessive sugar intake, diabetes, and cognitive impairment We discussed the Mediterranean diet, which has been shown to help patients reduce the risk of progressive memory disorders and reduces cardiovascular risk. This includes eating fish, eat fruits and green leafy vegetables, nuts like almonds and  hazelnuts, walnuts, and also use olive oil. Avoid fast foods and fried foods as much as possible. Avoid sweets and sugar as sugar use has been linked to worsening of memory function.  There is always a concern of gradual progression of memory problems. If this is the case, then we may need to adjust level of care according to patient needs. Support, both to the patient and caregiver, should then be put into place.    The Alzheimer's Association is here all day, every day for people facing Alzheimer's disease through our free 24/7 Helpline: 731-537-7177. The Helpline provides reliable information and support to all those who need assistance, such as individuals living with memory loss, Alzheimer's or other dementia, caregivers, health care professionals and the public.  Our highly trained and knowledgeable staff can help you with: Understanding memory loss, dementia and Alzheimer's  Medications and other treatment options  General information about aging and brain health  Skills to provide quality care and to find the best care from professionals  Legal, financial and living-arrangement decisions Our Helpline also features: Confidential care consultation provided by master's level clinicians who can help with decision-making support, crisis assistance and education on issues families face every day  Help in a caller's preferred language using our translation service that features more than 200 languages and dialects  Referrals to local community programs, services and ongoing support     FALL PRECAUTIONS: Be cautious when walking. Scan the area for obstacles that may increase the risk of trips and falls. When getting up in the mornings, sit up at the edge of the bed for  a few minutes before getting out of bed. Consider elevating the bed at the head end to avoid drop of blood pressure when getting up. Walk always in a well-lit room (use night lights in the walls). Avoid area rugs or power cords  from appliances in the middle of the walkways. Use a walker or a cane if necessary and consider physical therapy for balance exercise. Get your eyesight checked regularly.  FINANCIAL OVERSIGHT: Supervision, especially oversight when making financial decisions or transactions is also recommended.  HOME SAFETY: Consider the safety of the kitchen when operating appliances like stoves, microwave oven, and blender. Consider having supervision and share cooking responsibilities until no longer able to participate in those. Accidents with firearms and other hazards in the house should be identified and addressed as well.   ABILITY TO BE LEFT ALONE: If patient is unable to contact 911 operator, consider using LifeLine, or when the need is there, arrange for someone to stay with patients. Smoking is a fire hazard, consider supervision or cessation. Risk of wandering should be assessed by caregiver and if detected at any point, supervision and safe proof recommendations should be instituted.            Mediterranean Diet A Mediterranean diet refers to food and lifestyle choices that are based on the traditions of countries located on the [employer]. This way of eating has been shown to help prevent certain conditions and improve outcomes for people who have chronic diseases, like kidney disease and heart disease. What are tips for following this plan? Lifestyle  Cook and eat meals together with your family, when possible. Drink enough fluid to keep your urine clear or pale yellow. Be physically active every day. This includes: Aerobic exercise like running or swimming. Leisure activities like gardening, walking, or housework. Get 7-8 hours of sleep each night. If recommended by your health care provider, drink red wine in moderation. This means 1 glass a day for nonpregnant women and 2 glasses a day for men. A glass of wine equals 5 oz (150 mL). Reading food labels  Check the serving size of  packaged foods. For foods such as rice and pasta, the serving size refers to the amount of cooked product, not dry. Check the total fat in packaged foods. Avoid foods that have saturated fat or trans fats. Check the ingredients list for added sugars, such as corn syrup. Shopping  At the grocery store, buy most of your food from the areas near the walls of the store. This includes: Fresh fruits and vegetables (produce). Grains, beans, nuts, and seeds. Some of these may be available in unpackaged forms or large amounts (in bulk). Fresh seafood. Poultry and eggs. Low-fat dairy products. Buy whole ingredients instead of prepackaged foods. Buy fresh fruits and vegetables in-season from local farmers markets. Buy frozen fruits and vegetables in resealable bags. If you do not have access to quality fresh seafood, buy precooked frozen shrimp or canned fish, such as tuna, salmon, or sardines. Buy small amounts of raw or cooked vegetables, salads, or olives from the deli or salad bar at your store. Stock your pantry so you always have certain foods on hand, such as olive oil, canned tuna, canned tomatoes, rice, pasta, and beans. Cooking  Cook foods with extra-virgin olive oil instead of using butter or other vegetable oils. Have meat as a side dish, and have vegetables or grains as your main dish. This means having meat in small portions or adding small amounts of  meat to foods like pasta or stew. Use beans or vegetables instead of meat in common dishes like chili or lasagna. Experiment with different cooking methods. Try roasting or broiling vegetables instead of steaming or sauteing them. Add frozen vegetables to soups, stews, pasta, or rice. Add nuts or seeds for added healthy fat at each meal. You can add these to yogurt, salads, or vegetable dishes. Marinate fish or vegetables using olive oil, lemon juice, garlic, and fresh herbs. Meal planning  Plan to eat 1 vegetarian meal one day each week.  Try to work up to 2 vegetarian meals, if possible. Eat seafood 2 or more times a week. Have healthy snacks readily available, such as: Vegetable sticks with hummus. Greek yogurt. Fruit and nut trail mix. Eat balanced meals throughout the week. This includes: Fruit: 2-3 servings a day Vegetables: 4-5 servings a day Low-fat dairy: 2 servings a day Fish, poultry, or lean meat: 1 serving a day Beans and legumes: 2 or more servings a week Nuts and seeds: 1-2 servings a day Whole grains: 6-8 servings a day Extra-virgin olive oil: 3-4 servings a day Limit red meat and sweets to only a few servings a month What are my food choices? Mediterranean diet Recommended Grains: Whole-grain pasta. Brown rice. Bulgar wheat. Polenta. Couscous. Whole-wheat bread. Orpah Cobb. Vegetables: Artichokes. Beets. Broccoli. Cabbage. Carrots. Eggplant. Green beans. Chard. Kale. Spinach. Onions. Leeks. Peas. Squash. Tomatoes. Peppers. Radishes. Fruits: Apples. Apricots. Avocado. Berries. Bananas. Cherries. Dates. Figs. Grapes. Lemons. Melon. Oranges. Peaches. Plums. Pomegranate. Meats and other protein foods: Beans. Almonds. Sunflower seeds. Pine nuts. Peanuts. Cod. Salmon. Scallops. Shrimp. Tuna. Tilapia. Clams. Oysters. Eggs. Dairy: Low-fat milk. Cheese. Greek yogurt. Beverages: Water. Red wine. Herbal tea. Fats and oils: Extra virgin olive oil. Avocado oil. Grape seed oil. Sweets and desserts: [place] yogurt with honey. Baked apples. Poached pears. Trail mix. Seasoning and other foods: Basil. Cilantro. Coriander. Cumin. Mint. Parsley. Sage. Rosemary. Tarragon. Garlic. Oregano. Thyme. Pepper. Balsalmic vinegar. Tahini. Hummus. Tomato sauce. Olives. Mushrooms. Limit these Grains: Prepackaged pasta or rice dishes. Prepackaged cereal with added sugar. Vegetables: Deep fried potatoes (french fries). Fruits: Fruit canned in syrup. Meats and other protein foods: Beef. Pork. Lamb. Poultry with skin. Hot dogs.  Jodi Summers. Dairy: Ice cream. Sour cream. Whole milk. Beverages: Juice. Sugar-sweetened soft drinks. Beer. Liquor and spirits. Fats and oils: Butter. Canola oil. Vegetable oil. Beef fat (tallow). Lard. Sweets and desserts: Cookies. Cakes. Pies. Candy. Seasoning and other foods: Mayonnaise. Premade sauces and marinades. The items listed may not be a complete list. Talk with your dietitian about what dietary choices are right for you. Summary The Mediterranean diet includes both food and lifestyle choices. Eat a variety of fresh fruits and vegetables, beans, nuts, seeds, and whole grains. Limit the amount of red meat and sweets that you eat. Talk with your health care provider about whether it is safe for you to drink red wine in moderation. This means 1 glass a day for nonpregnant women and 2 glasses a day for men. A glass of wine equals 5 oz (150 mL). This information is not intended to replace advice given to you by your health care provider. Make sure you discuss any questions you have with your health care provider.

## 2022-07-22 NOTE — Progress Notes (Signed)
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 RH female with a history of breast cancer status post right mastectomy chemo/XRT, arthritis, history of intraventricular hemorrhage in October 2016 after a fall,  DM2, depression, insomnia, hypertension, hyperlipidemia, and situational depression diagnoses mild Alzheimer's dementia without behavioral disturbance per neuropsychological evaluation on 06/22/2022 seen today in follow up for memory loss. Patient is currently on rivastigmine recently increased by PCP to 9.5 mg TD,  (she had dizziness with fall while on donepezil).  She is able to participate in ADLs at the ALF.  She no longer drives      Follow up in 6 months. Continue rivastigmine 9.5 mg patch daily as per PCP, side effects discussed  Recommend good control of her cardiovascular risk factors Continue to control mood as per PCP, she is on Wellbutrin, Zoloft and Abilify    Subjective:    This patient is accompanied in the office by her daughter who supplements the history.  Previous records as well as any outside records available were reviewed prior to todays visit. Patient was last seen on 01/13/2022 with an MMSE of 28/30    Any changes in memory since last visit?  Endorsed, she has more difficulty with words, although she is not aware of these changes.  Sometimes she cannot remember her grandchildren's names, appointments or recent conversations.  She enjoys doing crossword puzzles.  At [facility], she enjoys all the activities provided by the facility. repeats oneself?  Endorsed especially about appointments, "where they are  we going" "today she did not remember that someone was coming to pick her up ". Disoriented when walking into a room?  Patient denies   Leaving objects in unusual places? "Not a whole lot"   Wandering behavior?  denies.  "She does not go outside much " Any personality  changes since last visit?  denies   Any worsening depression?:  denies   Hallucinations or paranoia?  denies   Seizures?    denies    Any sleep changes? Not sleeping enough, this is being addressed by PCP.  Denies vivid dreams, REM behavior or sleepwalking   Sleep apnea?   denies   Any hygiene concerns?    "Needs to be reminded ". Independent of bathing and dressing?  Endorsed  Does the patient needs help with medications?  Facility is in charge   Who is in charge of the finances?  Daughter is in charge     Any changes in appetite?  denies     Patient have trouble swallowing?  denies   Does the patient cook?  Any kitchen accidents such as leaving the stove on? Patient denies   Any headaches?   denies   Chronic back pain  denies   Ambulates with difficulty?   denies.  She is participating in an exercise program at the facility Recent falls or head injuries? denies     Unilateral weakness, numbness or tingling?  denies   Any tremors?  denies   Any anosmia?  Patient denies   Any incontinence of urine?  denies   Any bowel dysfunction?     denies      Patient lives at ALF, [facility].  Does the patient drive?  She no longer drives, car was sold.  Initial Visit 03/04/21 The patient is seen in neurologic consultation at the request of Jodi Gammon, MD for the evaluation of memory. The patient is  accompanied by [name] who supplements the history. This is a 82 y.o. year old RH  female who has had memory issues for about 3 years.  The patient has moved from [place] during the pandemic to be near her daughter, and moved to [facility] independent [facility], which is reported to be an "exhausting experience, as she was presented with so many things to do and to many people introducing themselves, which appeared to be overwhelming to her ".  Her daughter reports that this took a toll on her emotional and mental readiness.  Due to a noticeable cognitive decline, her daughter arranged  Neuropsych evaluation by Jodi Salm, PhD from neuropsychology at [facility]) in 05/27/2020 yielding an normal examination.  Over the last 6 to 8 months, her daughter states that short-term memory is worse, sometimes not remembering that she asked the same question, or told the same story.  She has not been sleeping well either, tried low dose of melatonin without significant improvement.  Denies any vivid dreams, REM behavior, or sleepwalking.  She denies any hallucinations, but she uses a cane "just in case I need to fight somebody that tries to get in the apartment ".  She denies any confusion when coming to her room, or leaving objects in unusual places.  There is some degree of depression due to all this changes.  She enjoys doing crossword puzzles and word finding.  She still trying to adjust to independent living facility.  In October 2022 she had COVID, and was in isolation for about 10 days, which in turn appeared to be therapeutic, as she did not need to interact with other residents.  There are no hygiene concerns, she is independent of bathing and dressing.  Her medications are in a pillbox, occasionally she may forget the dose.  Daughter is in charge of the finances.  Appetite is fairly good, denies trouble swallowing.  She denies any recent falls.  She sustained a head injury in October 2016 after falling in the parking lot of her [employer], with loss of consciousness but with no reported retroactive amnesia, with intraventricular hemorrhage needing hospitalization.  She had posttraumatic amnestic period of less than 4 hours.  Denies headaches, double vision, dizziness, focal numbness or tingling, unilateral weakness or tremors or anosmia. No history of seizures. Denies urine incontinence, retention, constipation or diarrhea.  Denies OSA, ETOH or Tobacco. Family History strong for Alzheimer's disease in sister and mother.  She is a retired Runner, broadcasting/film/video    CT scan performed in July  2016 reportedly revealed a linear hyperdensity within the posterior aspect of the lateral ventricles consistent with a small amount of intraventricular hemorrhage. There was also minimal blood products layering in the posterior horns of the lateral ventricles. Moreover, there was a 2 mm subdural hematoma lateral to the right temporal lobe. It was purportedly not hyperdense and therefore could be subacute. Focal hyperdensity in the right sylvian fissure  which possibly represent a minimal amount of traumatic subarachnoid hemorrhage.  A brain MRI scan performed in July 2016 reportedly revealed global parenchymal brain volume loss mildly advanced for age and mild white matter changes compatible with chronic small vessel ischemic disease.   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 An additional impairment was exhibited across  cognitive flexibility, while relative weaknesses were exhibited across processing speed and semantic fluency. Variability was seen across confrontation naming. Relative to her previous evaluation in April 2023, mild decline was exhibited across aspects of learning and memory. Mild declines were also exhibited across processing speed, complex attention, cognitive flexibility, sentence repetition, and semantic fluency. Areas of decline do appear somewhat more pronounced when compared to testing performed in April 2022. I continue to have primary concerns surrounding an underlying neurodegenerative illness, namely Alzheimer's disease. Across memory testing, retention rates ranged from 0% to 50% and she did exhibit some weaker performances across recognition trials. This continues to suggest evidence for rapid  forgetting and an evolving storage impairment, both of which are the hallmark characteristics of this illness. These testing patterns continue to align with her and her daughter's description of worsening repetition and short-term memory loss in her day-to-day life. Additional weakness surrounding semantic fluency, confrontation naming, and cognitive flexibility do follow fairly traditional disease progression, further escalating concerns.   PREVIOUS MEDICATIONS:   CURRENT MEDICATIONS:  Outpatient Encounter Medications as of 07/22/2022  Medication Sig   ARIPiprazole (ABILIFY) 2 MG tablet Take 1 tablet (2 mg total) by mouth daily.   buPROPion (WELLBUTRIN XL) 150 MG 24 hr tablet Take 150 mg by mouth daily.   clobetasol ointment (TEMOVATE) 0.05 % Apply 1 Application topically as needed.   lactose free nutrition (BOOST) LIQD Take 237 mLs by mouth every morning.   melatonin 1 MG TABS tablet Take 1 mg by mouth at bedtime.   metFORMIN (GLUCOPHAGE) 500 MG tablet Take 0.5 tablets (250 mg total) by mouth 2 (two) times daily with a meal.   rivastigmine (EXELON) 9.5 mg/24hr Place 9.5 mg onto the skin daily.   sertraline (ZOLOFT) 50 MG tablet Take 25 mg by mouth at bedtime.   No facility-administered encounter medications on file as of 07/22/2022.       07/20/2021    1:54 PM  MMSE - Mini Mental State Exam  Orientation to time 4  Orientation to Place 4  Registration 3  Attention/ Calculation 1  Recall 3  Language- name 2 objects 2  Language- repeat 1  Language- follow 3 step command 3  Language- read & follow direction 1  Write a sentence 1  Copy design 1  Total score 24      03/05/2021    6:00 AM  Montreal Cognitive Assessment   Visuospatial/ Executive (0/5) 5  Naming (0/3) 2  Attention: Read list of digits (0/2) 2  Attention: Read list of letters (0/1) 1  Attention: Serial 7 subtraction starting at 100 (0/3) 3  Language: Repeat phrase (0/2) 1  Language : Fluency (0/1) 1  Abstraction (0/2)  1  Delayed Recall (0/5) 3  Orientation (0/6) 6  Total 25  Adjusted Score (based on education) 25    Objective:     PHYSICAL EXAMINATION:    VITALS:   Vitals:   07/22/22 1137  BP: 136/75  Pulse: 92  Resp: 18  SpO2: 95%  Weight: 153 lb (69.4 kg)  Height: 5\' 5"  (1.651 m)    GEN:  The patient appears stated age and is in NAD. HEENT:  Normocephalic, atraumatic.   Neurological examination:  General: NAD, well-groomed, appears stated age. Orientation: The patient is alert. Oriented to person, place and not to date Cranial nerves: There is good facial symmetry.The speech is fluent and clear. No aphasia or dysarthria. Fund of knowledge is reduced. Recent and remote memory are impaired. Attention and  concentration are reduced.  Able to name objects and repeat phrases.  Hearing is intact to conversational tone.   Sensation: Sensation is intact to light touch throughout Motor: Strength is at least antigravity x4. DTR's 2/4 in UE/LE     Movement examination: Tone: There is normal tone in the UE/LE Abnormal movements:  no tremor.  No myoclonus.  No asterixis.   Coordination:  There is no decremation with RAM's. Normal finger to nose  Gait and Station: The patient has no difficulty arising out of a deep-seated chair without the use of the hands. The patient's stride length is good.  Gait is cautious and narrow.    Thank you for allowing [place] the opportunity to participate in the care of this nice patient. Please do not hesitate to contact us for any questions or concerns.   Total time spent on today's visit was 26 minutes dedicated to this patient today, preparing to see patient, examining the patient, ordering tests and/or medications and counseling the patient, documenting clinical information in the EHR or other health record, independently interpreting results and communicating results to the patient/family, discussing treatment and goals, answering patient's questions and  coordinating care.  Cc:  Jodi Gammon, MD  Marlowe Kays 07/22/2022 12:45 PM

## 2022-08-03 ENCOUNTER — Encounter: Payer: Self-pay | Admitting: Internal Medicine

## 2022-08-03 ENCOUNTER — Non-Acute Institutional Stay: Payer: Medicare PPO | Admitting: Internal Medicine

## 2022-08-03 VITALS — BP 122/68 | HR 82 | Temp 97.9°F | Resp 17 | Ht 65.0 in | Wt 154.2 lb

## 2022-08-03 DIAGNOSIS — E118 Type 2 diabetes mellitus with unspecified complications: Secondary | ICD-10-CM

## 2022-08-03 DIAGNOSIS — G4719 Other hypersomnia: Secondary | ICD-10-CM

## 2022-08-03 DIAGNOSIS — G301 Alzheimer's disease with late onset: Secondary | ICD-10-CM

## 2022-08-03 DIAGNOSIS — F02B3 Dementia in other diseases classified elsewhere, moderate, with mood disturbance: Secondary | ICD-10-CM

## 2022-08-03 DIAGNOSIS — F4321 Adjustment disorder with depressed mood: Secondary | ICD-10-CM

## 2022-08-03 NOTE — Progress Notes (Signed)
Location: [facility] features editor of Service:  Clinic (12)  Provider:   Code Status: Full Code Goals of Care:     08/03/2022    9:16 AM  Advanced Directives  Does Patient Have a Medical Advance Directive? Yes  Type of Estate agent of [place];Living will  Copy of Healthcare Power of Attorney in Chart? Yes - validated most recent copy scanned in chart (See row information)     Chief Complaint  Patient presents with   Medical Management of Chronic Issues    3 week follow up   Immunizations    Discuss the need for Shingles, Tdap, Covid vaccine   Health Maintenance    Patient is due for Eye exam, AWV, microalbumin, hep c screening, and bone density    HPI: Patient is a 82 y.o. female seen today for an acute visit for Follow up   [facility] in [facility]   She has h/o HLD Did not tolerate Statin H/o Breast Cancer s/p Right Mastectomy S/P Right hip Arthroplasty  in 9/21 H/o Intraventricular Hemorrhage in 10/16 after a fall Diabetes Type 2  Depression Insomnia  Dementia Alzheimer's dementia last MMSE 25 out of 30  Per nurses still refuses Showers Getting up in the morning more I had Changed her Melatonin to lower dose  No Depression or Paranoia Did tolerate increasing Exelon patch Wt Readings from Last 3 Encounters:  08/03/22 154 lb 3.2 oz (69.9 kg)  07/22/22 153 lb (69.4 kg)  07/13/22 152 lb 12.8 oz (69.3 kg)      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   History of COVID-19 2024   History of hysterectomy 03/18/2021   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    Hyperlipidemia 11/30/2018   Hyponatremia 09/01/2021   Knee pain 02/22/2017   Lactic acidosis 09/01/2021   Leukocytosis 09/01/2021   Lichen sclerosus 03/18/2021   Low back pain 02/22/2017   Malignant neoplasm of upper-outer quadrant of right female breast 1999   Mild dementia, concerns for Alzheimer's disease 06/22/2022   Orthostatic hypotension 09/01/2021   Pain in toe 12/06/2019   Personal history of chemotherapy    Personal history of radiation therapy    Right wrist sprain 09/01/2021   Syncope and collapse 09/01/2021   Unilateral primary osteoarthritis, 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    Past Surgical History:  Procedure Laterality Date   ABDOMINAL HYSTERECTOMY  1998   fibroid   BREAST LUMPECTOMY Right 1999   CHOLECYSTECTOMY     JOINT REPLACEMENT     L knee replacement   TONSILLECTOMY     TOTAL HIP ARTHROPLASTY Right 10/26/2019   Procedure: RIGHT TOTAL HIP ARTHROPLASTY ANTERIOR APPROACH;  Surgeon: Kathryne Hitch, MD;  Location: [facility];  Service: Orthopedics;  Laterality: Right;   WISDOM TOOTH EXTRACTION      No Known Allergies  Outpatient Encounter Medications as of 08/03/2022  Medication Sig   ARIPiprazole (ABILIFY) 2 MG tablet Take 1 tablet (2 mg total) by mouth daily.   buPROPion (WELLBUTRIN XL) 150 MG 24 hr tablet Take 150 mg by mouth daily.   clobetasol ointment (TEMOVATE) 0.05 % Apply  1 Application topically as needed.   lactose free nutrition (BOOST) LIQD Take 237 mLs by mouth every morning.   melatonin 1 MG TABS tablet Take 1 mg by mouth at bedtime.   metFORMIN (GLUCOPHAGE) 500 MG tablet Take 0.5 tablets (250 mg total) by mouth 2 (two) times daily with a meal.   rivastigmine (EXELON) 9.5 mg/24hr Place 9.5 mg onto the skin daily.   sertraline (ZOLOFT) 50 MG tablet Take 25 mg by mouth at bedtime.   No facility-administered encounter medications on file as of 08/03/2022.    Review of Systems:  Review of Systems  Constitutional:  Negative for activity change and  appetite change.  HENT: Negative.    Respiratory:  Negative for cough and shortness of breath.   Cardiovascular:  Negative for leg swelling.  Gastrointestinal:  Negative for constipation.  Genitourinary: Negative.   Musculoskeletal:  Negative for arthralgias, gait problem and myalgias.  Skin: Negative.   Neurological:  Negative for dizziness and weakness.  Psychiatric/Behavioral:  Positive for confusion and sleep disturbance. Negative for dysphoric mood.     Health Maintenance  Topic Date Due   Medicare Annual Wellness (AWV)  Never done   Diabetic kidney evaluation - Urine ACR  Never done   Hepatitis C Screening  Never done   DTaP/Tdap/Td (1 - Tdap) Never done   Zoster Vaccines- Shingrix (1 of 2) Never done   DEXA SCAN  Never done   COVID-19 Vaccine (4 - 2023-24 season) 02/15/2022   OPHTHALMOLOGY EXAM  08/03/2022 (Originally 12/10/1950)   HEMOGLOBIN A1C  09/02/2022   INFLUENZA VACCINE  09/16/2022   FOOT EXAM  12/02/2022   Pneumonia Vaccine 30+ Years old (2 of 2 - PCV) 12/04/2022   Diabetic kidney evaluation - eGFR measurement  03/05/2023   HPV VACCINES  Aged Out    Physical Exam: Vitals:   08/03/22 1006  BP: 122/68  Pulse: 82  Resp: 17  Temp: 97.9 F (36.6 C)  TempSrc: Temporal  SpO2: 96%  Weight: 154 lb 3.2 oz (69.9 kg)  Height: 5\' 5"  (1.651 m)   Body mass index is 25.66 kg/m. Physical Exam Vitals reviewed.  Constitutional:      Appearance: Normal appearance.  HENT:     Head: Normocephalic.     Nose: Nose normal.     Mouth/Throat:     Mouth: Mucous membranes are moist.     Pharynx: Oropharynx is clear.  Eyes:     Pupils: Pupils are equal, round, and reactive to light.  Cardiovascular:     Rate and Rhythm: Normal rate.     Pulses: Normal pulses.     Heart sounds: No murmur heard. Pulmonary:     Effort: Pulmonary effort is normal.     Breath sounds: Normal breath sounds.  Abdominal:     General: Abdomen is flat. Bowel sounds are normal.     Palpations:  Abdomen is soft.  Musculoskeletal:        General: No swelling.     Cervical back: Neck supple.  Skin:    General: Skin is warm.  Neurological:     General: No focal deficit present.     Mental Status: She is alert.  Psychiatric:        Mood and Affect: Mood normal.        Thought Content: Thought content normal.    Labs reviewed: Basic Metabolic Panel: Recent Labs    08/31/21 2312 09/01/21 0813 09/02/21 0042 09/08/21 0000 09/28/21 1615 10/05/21 0000 03/04/22 0000  NA 130* 134* 132*   < > 136 133* 135*  K 3.6 3.5 3.4*   < > 2.8* 4.5 4.1  CL 94* 98 98   < > 109 98* 100  CO2 25 24 25    < > 22 24* 22  GLUCOSE 167* 160* 118*  --  118*  --   --   BUN 6* 7* 5*   < > 9 8 11   CREATININE 0.85 0.71 0.63   < > 0.59 0.7 0.6  CALCIUM 8.8* 9.1 8.9   < > 7.4* 9.5 9.3  TSH 2.192  --   --   --   --   --  1.82   < > = values in this interval not displayed.   Liver Function Tests: Recent Labs    08/31/21 2312 09/02/21 0042 10/05/21 0000 03/04/22 0000  AST 33 25 33 26  ALT 45* 35 36* 26  ALKPHOS 59 55 74 69  BILITOT 0.2* 0.9  --   --   PROT 6.9 6.2*  --   --   ALBUMIN 3.8 3.5 4.2 3.9   No results for input(s): "LIPASE", "AMYLASE" in the last 8760 hours. No results for input(s): "AMMONIA" in the last 8760 hours. CBC: Recent Labs    08/31/21 2312 09/01/21 0631 09/02/21 0245 09/08/21 0000 09/28/21 1615 03/04/22 0000  WBC 12.6* 12.9* 8.5 8.3 8.1 7.8  NEUTROABS 10.0* 10.1*  --   --   --   --   HGB 12.5 14.4 11.9* 13.6 13.3 13.4  HCT 36.8 40.9 34.1* 39 39.5 39  MCV 90.2 88.3 88.6  --  91.4  --   PLT 238 226 194 138* 224 242   Lipid Panel: Recent Labs    03/04/22 0000  CHOL 201*  HDL 54  LDLCALC 133  TRIG 244   Lab Results  Component Value Date   HGBA1C 6.7 03/04/2022    Procedures since last visit: No results found.  Assessment/Plan 1. Moderate late onset Alzheimer's dementia with mood disturbance (HCC) Stable in AL Nurses c/o her Refusing Showers We  will Continue her on Exelon Same dose Tolerating well Will try to schedule time for shower to get consistency  2. Controlled type 2 diabetes mellitus with complication, without long-term current use of insulin (HCC) A1C 6.4 On low dose of Metformin  3. Situational depression Still does not sleep well atnight Will Start her on Remeron 7.5 mg Also Discontinue Abilfy for now as not paranoid anymore  4. Excessive daytime sleepiness Trying to add Remeron to see if it will help her rest more at night  Also on Melatonin    Labs/tests ordered:  * No order type specified * Next appt:  08/16/2022

## 2022-08-16 ENCOUNTER — Encounter: Payer: Medicare PPO | Admitting: Adult Health

## 2022-08-30 ENCOUNTER — Encounter: Payer: Self-pay | Admitting: Adult Health

## 2022-08-30 ENCOUNTER — Non-Acute Institutional Stay (INDEPENDENT_AMBULATORY_CARE_PROVIDER_SITE_OTHER): Payer: Medicare PPO | Admitting: Adult Health

## 2022-08-30 VITALS — BP 134/66 | HR 105 | Temp 97.6°F | Resp 17 | Wt 156.6 lb

## 2022-08-30 DIAGNOSIS — Z0000 Encounter for general adult medical examination without abnormal findings: Secondary | ICD-10-CM | POA: Diagnosis not present

## 2022-08-30 NOTE — Progress Notes (Signed)
Subjective:   Jodi Summers is a 82 y.o. female who presents for Medicare Annual (Subsequent) preventive examination at [facility].   Visit Complete: In person  Patient Medicare AWV questionnaire was completed by the patient on 08/30/22; I have confirmed that all information answered by patient is correct and no changes since this date.  Review of Systems           Objective:    Today's Vitals   08/30/22 1527  BP: 134/66  Pulse: (!) 105  Resp: 17  Temp: 97.6 F (36.4 C)  TempSrc: Temporal  SpO2: 96%  Weight: 156 lb 9.6 oz (71 kg)   Body mass index is 26.06 kg/m.     08/30/2022    3:26 PM 08/03/2022    9:16 AM 07/22/2022   11:41 AM 07/13/2022   11:10 AM 07/13/2022   11:07 AM 03/08/2022    1:54 PM 01/13/2022   11:42 AM  Advanced Directives  Does Patient Have a Medical Advance Directive? Yes Yes Yes Yes Yes Yes Yes  Type of Estate agent of [place];Living will Healthcare Power of [place];Living will Healthcare Power of [employer] of [place];Living will  Living will;Healthcare Power of [place];Out of facility DNR (pink MOST or yellow form)   Does patient want to make changes to medical advance directive? No - Patient declined  No - Patient declined No - Patient declined No - Patient declined No - Patient declined   Copy of Healthcare Power of Attorney in Chart? Yes - validated most recent copy scanned in chart (See row information) Yes - validated most recent copy scanned in chart (See row information) No - copy requested        Current Medications (verified) Outpatient Encounter Medications as of 08/30/2022  Medication Sig   buPROPion (WELLBUTRIN XL) 150 MG 24 hr tablet Take 150 mg by mouth daily.   clobetasol ointment (TEMOVATE) 0.05 % Apply 1 Application topically as needed.   lactose free nutrition (BOOST) LIQD Take 237 mLs by mouth every morning.   melatonin 1 MG TABS tablet Take 1 mg by mouth  at bedtime.   metFORMIN (GLUCOPHAGE) 500 MG tablet Take 0.5 tablets (250 mg total) by mouth 2 (two) times daily with a meal.   mirtazapine (REMERON) 7.5 MG tablet Take 7.5 mg by mouth at bedtime.   rivastigmine (EXELON) 9.5 mg/24hr Place 9.5 mg onto the skin daily.   sertraline (ZOLOFT) 50 MG tablet Take 25 mg by mouth at bedtime.   No facility-administered encounter medications on file as of 08/30/2022.    Allergies (verified) Patient has no known allergies.   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   Hyperlipidemia 11/30/2018   Hyponatremia 09/01/2021   Knee pain 02/22/2017   Lactic acidosis 09/01/2021   Leukocytosis 09/01/2021   Lichen sclerosus 03/18/2021   Low back pain 02/22/2017   Malignant neoplasm of upper-outer quadrant of right female breast 1999   Mild dementia, concerns for Alzheimer's disease 06/22/2022   Orthostatic hypotension 09/01/2021  Pain in toe 12/06/2019   Personal history of chemotherapy    Personal history of radiation therapy    Right wrist sprain 09/01/2021   Syncope and collapse 09/01/2021   Unilateral primary osteoarthritis, 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   Past Surgical History:  Procedure Laterality Date   ABDOMINAL HYSTERECTOMY  1998   fibroid   BREAST LUMPECTOMY Right 1999   CHOLECYSTECTOMY     JOINT REPLACEMENT     L knee replacement   TONSILLECTOMY     TOTAL HIP ARTHROPLASTY Right 10/26/2019   Procedure: RIGHT TOTAL HIP ARTHROPLASTY ANTERIOR APPROACH;  Surgeon: Kathryne Hitch, MD;  Location: [facility];  Service: Orthopedics;  Laterality: Right;   WISDOM TOOTH EXTRACTION     Family History  Problem Relation Age of Onset   Alzheimer's disease Mother    Alzheimer's disease Sister    Social History   Socioeconomic History   Marital status: Widowed    Spouse name: Not on file   Number of children: Not on file   Years of education: 16   Highest education level: Bachelor's degree (e.g., BA, AB, BS)  Occupational History   Occupation: Retired    Comment: Runner, broadcasting/film/video  Tobacco Use   Smoking status: Never   Smokeless tobacco: Never  Vaping Use   Vaping status: Never Used  Substance and Sexual Activity   Alcohol use: Not Currently    Comment: occ   Drug use: Never   Sexual activity: Not on file    Comment: Hysterectomy  Other Topics Concern   Not on file  Social History Narrative   Right handed   No caffeine   Lives [facility]   Social Determinants of Health   Financial Resource Strain: Not on file  Food Insecurity: Not on file  Transportation Needs: Not on file  Physical Activity: Not on file  Stress: Not on file  Social Connections: Not on file    Tobacco Counseling Counseling given: Not Answered   Clinical Intake:     Pain : No/denies pain     BMI - recorded: 26.06 Nutritional Status: BMI 25 -29 Overweight Nutritional Risks: None Diabetes: No  How often do you need to have someone help you when you read instructions, pamphlets, or other written materials from your doctor or pharmacy?: 1 - Never         Activities of Daily Living    08/30/2022    3:26 PM  In your present state of health, do you have any difficulty performing the following activities:  Hearing? 1  Vision? 0  Difficulty concentrating or making decisions? 1  Walking or climbing stairs? 0  Dressing or bathing? 0  Doing errands, shopping? 0    Patient Care Team: [name], MD as PCP - General (Internal Medicine)  Indicate any recent Medical  Services you may have received from other than [facility] providers in the past year (date may be approximate).     Assessment:   This is a routine wellness examination for Shaquayla.  Hearing/Vision screen No results found.  Dietary issues and exercise activities discussed:     Goals Addressed             This Visit's Progress    Increase physical activity       Attend class in auditorium       Depression Screen    08/30/2022    3:26 PM 08/03/2022   10:09 AM 07/13/2022   11:07 AM 12/01/2021   10:03 AM  04/08/2021   11:18 AM 03/17/2021    2:19 PM  PHQ 2/9 Scores  PHQ - 2 Score 0 0 0 0 0 0    Fall Risk    08/30/2022    3:25 PM 08/03/2022   10:09 AM 07/22/2022   11:41 AM 07/13/2022   11:07 AM 03/08/2022    1:54 PM  Fall Risk   Falls in the past year? 0 0 1 1 0  Number falls in past yr: 0 0 0 0 0  Injury with Fall? 0 0 0 1 0  Risk for fall due to : No Fall Risks No Fall Risks  History of fall(s) No Fall Risks;Impaired balance/gait;Impaired mobility  Follow up Falls evaluation completed Falls evaluation completed Falls evaluation completed Falls evaluation completed Falls evaluation completed    MEDICARE RISK AT HOME:  Medicare Risk at Home - 08/30/22 1601     Any stairs in or around the home? No    If so, are there any without handrails? No    Home free of loose throw rugs in walkways, pet beds, electrical cords, etc? Yes    Adequate lighting in your home to reduce risk of falls? Yes    Life alert? Yes    Use of a cane, walker or w/c? No    Grab bars in the bathroom? Yes    Shower chair or bench in shower? Yes    Elevated toilet seat or a handicapped toilet? No             TIMED UP AND GO:  Was the test performed?  Yes  Length of time to ambulate 10 feet: 13 sec Gait slow and steady without use of assistive device    Cognitive Function:    08/30/2022    3:33 PM 07/20/2021    1:54 PM  MMSE - Mini Mental State Exam  Orientation to time 1 4  Orientation to Place 5  4  Registration 3 3  Attention/ Calculation 5 1  Recall 3 3  Language- name 2 objects 2 2  Language- repeat 1 1  Language- follow 3 step command 3 3  Language- read & follow direction 1 1  Write a sentence 1 1  Copy design 1 1  Total score 26 24      03/05/2021    6:00 AM  Montreal Cognitive Assessment   Visuospatial/ Executive (0/5) 5  Naming (0/3) 2  Attention: Read list of digits (0/2) 2  Attention: Read list of letters (0/1) 1  Attention: Serial 7 subtraction starting at 100 (0/3) 3  Language: Repeat phrase (0/2) 1  Language : Fluency (0/1) 1  Abstraction (0/2) 1  Delayed Recall (0/5) 3  Orientation (0/6) 6  Total 25  Adjusted Score (based on education) 25      Immunizations Immunization History  Administered Date(s) Administered   Fluad Quad(high Dose 65+) 11/20/2021   Moderna SARS-COV2 Booster Vaccination 01/01/2020   Moderna Sars-Covid-2 Vaccination 02/14/2019, 03/14/2019   Pneumococcal Polysaccharide-23 12/03/2021   Unspecified SARS-COV-2 Vaccination 12/21/2021    TDAP status: Due, Education has been provided regarding the importance of this vaccine. Advised may receive this vaccine at local pharmacy or Health Dept. Aware to provide a copy of the vaccination record if obtained from local pharmacy or Health Dept. Verbalized acceptance and understanding.  Flu Vaccine status: Up to date  Pneumococcal vaccine status: Up to date  Covid-19 vaccine status: Completed vaccines  Qualifies for Shingles Vaccine? Yes   Zostavax completed No  Shingrix Completed?: No.    Education has been provided regarding the importance of this vaccine. Patient has been advised to call insurance company to determine out of pocket expense if they have not yet received this vaccine. Advised may also receive vaccine at local pharmacy or Health Dept. Verbalized acceptance and understanding.  Screening Tests Health Maintenance  Topic Date Due   OPHTHALMOLOGY EXAM  Never done   Diabetic  kidney evaluation - Urine ACR  Never done   DTaP/Tdap/Td (1 - Tdap) Never done   Zoster Vaccines- Shingrix (1 of 2) Never done   DEXA SCAN  Never done   COVID-19 Vaccine (4 - 2023-24 season) 02/15/2022   Hepatitis C Screening  08/16/2023 (Originally 12/10/1958)   INFLUENZA VACCINE  09/16/2022   FOOT EXAM  12/02/2022   Pneumonia Vaccine 23+ Years old (2 of 2 - PCV) 12/04/2022   HEMOGLOBIN A1C  01/15/2023   Diabetic kidney evaluation - eGFR measurement  07/15/2023   Medicare Annual Wellness (AWV)  08/30/2023   HPV VACCINES  Aged Out    Health Maintenance  Health Maintenance Due  Topic Date Due   OPHTHALMOLOGY EXAM  Never done   Diabetic kidney evaluation - Urine ACR  Never done   DTaP/Tdap/Td (1 - Tdap) Never done   Zoster Vaccines- Shingrix (1 of 2) Never done   DEXA SCAN  Never done   COVID-19 Vaccine (4 - 2023-24 season) 02/15/2022    Colorectal cancer screening: No longer required.   Mammogram status: Completed 04/2022. Repeat every year  Bone Density status: Ordered 09/02/22. Pt provided with contact info and advised to call to schedule appt.  Lung Cancer Screening: (Low Dose CT Chest recommended if Age 66-80 years, 20 pack-year currently smoking OR have quit w/in 15years.) does not qualify.   Lung Cancer Screening Referral: na  Additional Screening:  Hepatitis C Screening: does not qualify; Completed na  Vision Screening: Recommended annual ophthalmology exams for early detection of glaucoma and other disorders of the eye. Is the patient up to date with their annual eye exam?  Yes  Who is the provider or what is the name of the office in which the patient attends annual eye exams? [facility] If pt is not established with a provider, would they like to be referred to a provider to establish care? No .   Dental Screening: Recommended annual dental exams for proper oral hygiene  Diabetic Foot Exam: Diabetic Foot Exam: Completed 12/02/22  Community Resource Referral /  Chronic Care Management: CRR required this visit?  No   CCM required this visit?  No     Plan:     I have personally reviewed and noted the following in the patient's chart:   Medical and social history Use of alcohol, tobacco or illicit drugs  Current medications and supplements including opioid prescriptions. Patient is not currently taking opioid prescriptions. Functional ability and status Nutritional status Physical activity Advanced directives List of other physicians Hospitalizations, surgeries, and ER visits in previous 12 months Vitals Screenings to include cognitive, depression, and falls Referrals and appointments  In addition, I have reviewed and discussed with patient certain preventive protocols, quality metrics, and best practice recommendations. A written personalized care plan for preventive services as well as general preventive health recommendations were provided to patient.     Fletcher Anon, NP   08/30/2022   After Visit Summary:  handed to pt  Nurse Notes: sees ophthalmology. Need eye exam note to complete metric. CMA to attempt to  obtain. Also will order urine micro albumin.

## 2022-08-30 NOTE — Patient Instructions (Signed)
Jodi Summers , Thank you for taking time to come for your Medicare Wellness Visit. I appreciate your ongoing commitment to your health goals. Please review the following plan we discussed and let me know if I can assist you in the future.   Screening recommendations/referrals: Colonoscopy aged out  Mammogram done March 2024 Bone Density due and ordered Recommended yearly ophthalmology/optometry visit for glaucoma screening and checkup Recommended yearly dental visit for hygiene and checkup  Vaccinations: Influenza vaccine- due annually in September/October Pneumococcal vaccine up to date Tdap vaccine ordered  Shingles vaccine discussed, will hold off, consider at next interval    Advanced directives: reviewed  Conditions/risks identified: fall   Next appointment: 1 year   Preventive Care 80 Years and Older, Female Preventive care refers to lifestyle choices and visits with your health care provider that can promote health and wellness. What does preventive care include? A yearly physical exam. This is also called an annual well check. Dental exams once or twice a year. Routine eye exams. Ask your health care provider how often you should have your eyes checked. Personal lifestyle choices, including: Daily care of your teeth and gums. Regular physical activity. Eating a healthy diet. Avoiding tobacco and drug use. Limiting alcohol use. Practicing safe sex. Taking low-dose aspirin every day. Taking vitamin and mineral supplements as recommended by your health care provider. What happens during an annual well check? The services and screenings done by your health care provider during your annual well check will depend on your age, overall health, lifestyle risk factors, and family history of disease. Counseling  Your health care provider may ask you questions about your: Alcohol use. Tobacco use. Drug use. Emotional well-being. Home and relationship well-being. Sexual  activity. Eating habits. History of falls. Memory and ability to understand (cognition). Work and work Astronomer. Reproductive health. Screening  You may have the following tests or measurements: Height, weight, and BMI. Blood pressure. Lipid and cholesterol levels. These may be checked every 5 years, or more frequently if you are over 70 years old. Skin check. Lung cancer screening. You may have this screening every year starting at age 32 if you have a 30-pack-year history of smoking and currently smoke or have quit within the past 15 years. Fecal occult blood test (FOBT) of the stool. You may have this test every year starting at age 46. Flexible sigmoidoscopy or colonoscopy. You may have a sigmoidoscopy every 5 years or a colonoscopy every 10 years starting at age 28. Hepatitis C blood test. Hepatitis B blood test. Sexually transmitted disease (STD) testing. Diabetes screening. This is done by checking your blood sugar (glucose) after you have not eaten for a while (fasting). You may have this done every 1-3 years. Bone density scan. This is done to screen for osteoporosis. You may have this done starting at age 95. Mammogram. This may be done every 1-2 years. Talk to your health care provider about how often you should have regular mammograms. Talk with your health care provider about your test results, treatment options, and if necessary, the need for more tests. Vaccines  Your health care provider may recommend certain vaccines, such as: Influenza vaccine. This is recommended every year. Tetanus, diphtheria, and acellular pertussis (Tdap, Td) vaccine. You may need a Td booster every 10 years. Zoster vaccine. You may need this after age 53. Pneumococcal 13-valent conjugate (PCV13) vaccine. One dose is recommended after age 46. Pneumococcal polysaccharide (PPSV23) vaccine. One dose is recommended after age 43. Talk to  your health care provider about which screenings and vaccines  you need and how often you need them. This information is not intended to replace advice given to you by your health care provider. Make sure you discuss any questions you have with your health care provider. Document Released: 02/28/2015 Document Revised: 10/22/2015 Document Reviewed: 12/03/2014 Elsevier Interactive Patient Education  2017 [employer].  Fall Prevention in the Home Falls can cause injuries. They can happen to people of all ages. There are many things you can do to make your home safe and to help prevent falls. What can I do on the outside of my home? Regularly fix the edges of walkways and driveways and fix any cracks. Remove anything that might make you trip as you walk through a door, such as a raised step or threshold. Trim any bushes or trees on the path to your home. Use bright outdoor lighting. Clear any walking paths of anything that might make someone trip, such as rocks or tools. Regularly check to see if handrails are loose or broken. Make sure that both sides of any steps have handrails. Any raised decks and porches should have guardrails on the edges. Have any leaves, snow, or ice cleared regularly. Use sand or salt on walking paths during winter. Clean up any spills in your garage right away. This includes oil or grease spills. What can I do in the bathroom? Use night lights. Install grab bars by the toilet and in the tub and shower. Do not use towel bars as grab bars. Use non-skid mats or decals in the tub or shower. If you need to sit down in the shower, use a plastic, non-slip stool. Keep the floor dry. Clean up any water that spills on the floor as soon as it happens. Remove soap buildup in the tub or shower regularly. Attach bath mats securely with double-sided non-slip rug tape. Do not have throw rugs and other things on the floor that can make you trip. What can I do in the bedroom? Use night lights. Make sure that you have a light by your bed that  is easy to reach. Do not use any sheets or blankets that are too big for your bed. They should not hang down onto the floor. Have a firm chair that has side arms. You can use this for support while you get dressed. Do not have throw rugs and other things on the floor that can make you trip. What can I do in the kitchen? Clean up any spills right away. Avoid walking on wet floors. Keep items that you use a lot in easy-to-reach places. If you need to reach something above you, use a strong step stool that has a grab bar. Keep electrical cords out of the way. Do not use floor polish or wax that makes floors slippery. If you must use wax, use non-skid floor wax. Do not have throw rugs and other things on the floor that can make you trip. What can I do with my stairs? Do not leave any items on the stairs. Make sure that there are handrails on both sides of the stairs and use them. Fix handrails that are broken or loose. Make sure that handrails are as long as the stairways. Check any carpeting to make sure that it is firmly attached to the stairs. Fix any carpet that is loose or worn. Avoid having throw rugs at the top or bottom of the stairs. If you do have throw rugs, attach  them to the floor with carpet tape. Make sure that you have a light switch at the top of the stairs and the bottom of the stairs. If you do not have them, ask someone to add them for you. What else can I do to help prevent falls? Wear shoes that: Do not have high heels. Have rubber bottoms. Are comfortable and fit you well. Are closed at the toe. Do not wear sandals. If you use a stepladder: Make sure that it is fully opened. Do not climb a closed stepladder. Make sure that both sides of the stepladder are locked into place. Ask someone to hold it for you, if possible. Clearly mark and make sure that you can see: Any grab bars or handrails. First and last steps. Where the edge of each step is. Use tools that help you  move around (mobility aids) if they are needed. These include: Canes. Walkers. Scooters. Crutches. Turn on the lights when you go into a dark area. Replace any light bulbs as soon as they burn out. Set up your furniture so you have a clear path. Avoid moving your furniture around. If any of your floors are uneven, fix them. If there are any pets around you, be aware of where they are. Review your medicines with your doctor. Some medicines can make you feel dizzy. This can increase your chance of falling. Ask your doctor what other things that you can do to help prevent falls. This information is not intended to replace advice given to you by your health care provider. Make sure you discuss any questions you have with your health care provider. Document Released: 11/28/2008 Document Revised: 07/10/2015 Document Reviewed: 03/08/2014 Elsevier Interactive Patient Education  2017 [employer].

## 2022-09-02 ENCOUNTER — Ambulatory Visit
Admission: RE | Admit: 2022-09-02 | Discharge: 2022-09-02 | Disposition: A | Discharge status: Home / Self Care | Payer: Medicare PPO | Source: Ambulatory Visit | Attending: Adult Health | Admitting: Adult Health

## 2022-09-02 DIAGNOSIS — M1990 Unspecified osteoarthritis, unspecified site: Secondary | ICD-10-CM | POA: Diagnosis not present

## 2022-09-02 DIAGNOSIS — Z853 Personal history of malignant neoplasm of breast: Secondary | ICD-10-CM | POA: Diagnosis not present

## 2022-09-02 DIAGNOSIS — E349 Endocrine disorder, unspecified: Secondary | ICD-10-CM | POA: Diagnosis not present

## 2022-09-02 DIAGNOSIS — N958 Other specified menopausal and perimenopausal disorders: Secondary | ICD-10-CM | POA: Diagnosis not present

## 2022-09-02 DIAGNOSIS — M8588 Other specified disorders of bone density and structure, other site: Secondary | ICD-10-CM | POA: Diagnosis not present

## 2022-09-02 DIAGNOSIS — E2839 Other primary ovarian failure: Secondary | ICD-10-CM

## 2022-09-07 DIAGNOSIS — E119 Type 2 diabetes mellitus without complications: Secondary | ICD-10-CM | POA: Diagnosis not present

## 2022-09-07 LAB — PROTEIN / CREATININE RATIO, URINE: Creatinine, Urine: <1.2

## 2022-09-07 LAB — MICROALBUMIN, URINE: Microalb, Ur: <1.2

## 2022-09-14 ENCOUNTER — Encounter: Payer: Medicare PPO | Admitting: Internal Medicine

## 2022-09-14 ENCOUNTER — Non-Acute Institutional Stay: Payer: Medicare PPO | Admitting: Internal Medicine

## 2022-09-14 ENCOUNTER — Encounter: Payer: Self-pay | Admitting: Internal Medicine

## 2022-09-14 VITALS — BP 148/82 | HR 107 | Temp 97.8°F | Resp 17 | Ht 65.0 in | Wt 159.0 lb

## 2022-09-14 DIAGNOSIS — M810 Age-related osteoporosis without current pathological fracture: Secondary | ICD-10-CM

## 2022-09-14 DIAGNOSIS — E118 Type 2 diabetes mellitus with unspecified complications: Secondary | ICD-10-CM | POA: Diagnosis not present

## 2022-09-14 DIAGNOSIS — F02B3 Dementia in other diseases classified elsewhere, moderate, with mood disturbance: Secondary | ICD-10-CM | POA: Diagnosis not present

## 2022-09-14 DIAGNOSIS — F5101 Primary insomnia: Secondary | ICD-10-CM | POA: Diagnosis not present

## 2022-09-14 DIAGNOSIS — F4321 Adjustment disorder with depressed mood: Secondary | ICD-10-CM

## 2022-09-14 DIAGNOSIS — G301 Alzheimer's disease with late onset: Secondary | ICD-10-CM | POA: Diagnosis not present

## 2022-09-14 DIAGNOSIS — W19XXXD Unspecified fall, subsequent encounter: Secondary | ICD-10-CM | POA: Diagnosis not present

## 2022-09-14 NOTE — Progress Notes (Signed)
Location: [facility] editor of Service:  Clinic (12)  Provider:   Code Status:  Goals of Care:     09/14/2022   11:44 AM  Advanced Directives  Does Patient Have a Medical Advance Directive? Yes  Type of Estate agent of [place];Living will  Does patient want to make changes to medical advance directive? No - Patient declined  Copy of Healthcare Power of Attorney in Chart? Yes - validated most recent copy scanned in chart (See row information)     Chief Complaint  Patient presents with   Medical Management of Chronic Issues    Patient is being  seen for 8 week follow up    Immunizations    Patient is due for Shingles, Tdap, Pneumonia and updated covid vaccine   Health Maintenance    Discuss the need for Eye exam and kidney evaluation .    HPI: Patient is a 82 y.o. female seen today for an acute visit for Follow up and meet family  [facility]   She has h/o HLD Did not tolerate Statin H/o Breast Cancer s/p Right Mastectomy S/P Right hip Arthroplasty  in 9/21 H/o Intraventricular Hemorrhage in 10/16 after a fall Diabetes Type 2  Depression Insomnia   Dementia Alzheimer's dementia last MMSE 25 out of 30  Per nurses still refuses Showers Getting up in the morning more I had Changed her Melatonin to lower dose  No Depression or Paranoia Did tolerate increasing Exelon patch  Also now on Remeron to help sleep atnight Little better Coming out of room better Per nurses still likes to sleep in the morning Hard for her to go to Sleep. 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   History of COVID-19 2024   History of hysterectomy 03/18/2021   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   Hyperlipidemia 11/30/2018   Hyponatremia 09/01/2021   Knee pain 02/22/2017   Lactic acidosis 09/01/2021   Leukocytosis 09/01/2021   Lichen sclerosus 03/18/2021   Low back pain 02/22/2017   Malignant neoplasm of upper-outer quadrant of right female breast 1999   Mild dementia, concerns for Alzheimer's disease 06/22/2022   Orthostatic hypotension 09/01/2021   Pain in toe 12/06/2019   Personal history of chemotherapy    Personal history of radiation therapy    Right wrist sprain 09/01/2021   Syncope and collapse 09/01/2021   Unilateral primary osteoarthritis, 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    Past Surgical History:  Procedure Laterality Date   ABDOMINAL HYSTERECTOMY  1998   fibroid   BREAST LUMPECTOMY Right 1999   CHOLECYSTECTOMY     JOINT REPLACEMENT     L knee replacement   TONSILLECTOMY     TOTAL HIP ARTHROPLASTY Right 10/26/2019   Procedure: RIGHT TOTAL HIP ARTHROPLASTY ANTERIOR APPROACH;  Surgeon: Kathryne Hitch, MD;  Location: [facility];  Service: Orthopedics;  Laterality: Right;   WISDOM TOOTH EXTRACTION      No Known Allergies  Outpatient Encounter Medications as of 09/14/2022  Medication Sig   buPROPion (WELLBUTRIN XL) 150 MG 24 hr tablet Take 150 mg by mouth daily.   clobetasol ointment (TEMOVATE) 0.05 % Apply 1 Application topically as needed.   lactose free nutrition (BOOST) LIQD Take 237 mLs by mouth every morning.   melatonin 1 MG TABS tablet Take 1 mg by mouth at bedtime.   metFORMIN (GLUCOPHAGE) 500 MG tablet Take 0.5 tablets (250 mg total) by mouth 2 (two) times daily with a meal.    mirtazapine (REMERON) 7.5 MG tablet Take 7.5 mg by mouth at bedtime.   rivastigmine (EXELON) 9.5 mg/24hr Place 9.5 mg onto the skin daily.   sertraline (ZOLOFT) 50 MG tablet Take 25 mg by mouth at bedtime.   No facility-administered encounter medications on file as of 09/14/2022.    Review of Systems:  Review of Systems  Unable to perform ROS: Dementia    Health Maintenance  Topic Date Due   OPHTHALMOLOGY EXAM  Never done   Diabetic kidney evaluation - Urine ACR  Never done   DTaP/Tdap/Td (1 - Tdap) Never done   Zoster Vaccines- Shingrix (1 of 2) Never done   COVID-19 Vaccine (4 - 2023-24 season) 02/15/2022   Pneumonia Vaccine 6+ Years old (2 of 2 - PCV) 12/04/2022   Hepatitis C Screening  08/16/2023 (Originally 12/10/1958)   INFLUENZA VACCINE  09/16/2022   FOOT EXAM  12/02/2022   HEMOGLOBIN A1C  01/15/2023   Diabetic kidney evaluation - eGFR measurement  07/15/2023   Medicare Annual Wellness (AWV)  08/30/2023   DEXA SCAN  Completed   HPV VACCINES  Aged Out    Physical Exam: Vitals:   09/14/22 1303  BP: (!) 148/82  Pulse: (!) 107  Resp: 17  Temp: 97.8 F (36.6 C)  TempSrc: Temporal  SpO2: 97%  Weight: 159 lb (72.1 kg)  Height: 5\' 5"  (1.651 m)   Body mass index is 26.46 kg/m. Physical Exam Vitals reviewed.  Constitutional:      Appearance: Normal appearance.  HENT:     Head: Normocephalic.     Nose: Nose normal.     Mouth/Throat:     Mouth: Mucous membranes are moist.     Pharynx: Oropharynx is clear.  Eyes:     Pupils: Pupils are equal, round, and reactive to light.  Cardiovascular:     Rate and Rhythm: Normal rate and regular rhythm.     Pulses: Normal pulses.     Heart sounds: Normal heart sounds. No murmur heard. Pulmonary:     Effort: Pulmonary effort is normal.     Breath sounds: Normal breath sounds.  Abdominal:     General: Abdomen is flat. Bowel sounds are normal.     Palpations: Abdomen is soft.  Musculoskeletal:        General: No  swelling.     Cervical back: Neck supple.  Skin:    General: Skin is warm.  Neurological:     General: No focal deficit present.     Mental Status: She is alert.  Psychiatric:        Mood and Affect: Mood normal.        Thought Content: Thought content normal.     Labs reviewed: Basic Metabolic Panel: Recent Labs    09/28/21 1615 10/05/21 0000 03/04/22 0000 07/15/22 0000  NA 136 133* 135* 138  138  K 2.8* 4.5 4.1 4.2  4.2  CL 109 98* 100 103  103  CO2 22 24* 22 23*  23*  GLUCOSE 118*  --   --   --   BUN 9 8 11 9  9   CREATININE 0.59 0.7 0.6 0.7  0.7  CALCIUM 7.4* 9.5 9.3 9.4  9.4  TSH  --   --  1.82  --    Liver Function Tests: Recent Labs    10/05/21 0000 03/04/22 0000 07/15/22 0000  AST 33 26 24  24   ALT 36* 26 22  22   ALKPHOS 74 69 62  62  ALBUMIN 4.2 3.9 4.0  4.0   No results for input(s): "LIPASE", "AMYLASE" in the last 8760 hours. No results for input(s): "AMMONIA" in the last 8760 hours. CBC: Recent Labs    09/28/21 1615 03/04/22 0000 07/15/22 0000  WBC 8.1 7.8 9.6  9.6  HGB 13.3 13.4 13.2  13.2  HCT 39.5 39 39  39  MCV 91.4  --   --   PLT 224 242 114*  114*   Lipid Panel: Recent Labs    03/04/22 0000  CHOL 201*  HDL 54  LDLCALC 133  TRIG 161   Lab Results  Component Value Date   HGBA1C 6.4 07/15/2022    Procedures since last visit: DG Bone Density  Result Date: 09/02/2022 EXAM: DUAL X-RAY ABSORPTIOMETRY (DXA) FOR BONE MINERAL DENSITY IMPRESSION: Referring Physician:  Fletcher Anon Your patient completed a bone mineral density test using GE Lunar iDXA system (analysis version: 16). Technologist:     lmn PATIENT: Name: Jodi Summers, Jodi Summers Patient ID: [id number] Birth Date: Dec 16, 1940 Height: 63.8 in. Sex: Female Measured: 09/02/2022 Weight: 155.2 lbs. Indications: Advanced Age, Breast Cancer History, Diabetic non insulin, Estrogen Deficient, Hysterectomy, osteoarthritis, Postmenopausal, Remeron, Zoloft Fractures: Treatments:  ASSESSMENT: The BMD measured at Forearm Radius 33% is 0.642 g/cm2 with a T-score of -2.7. This patient is considered osteoporotic according to World Health Organization [facility]) criteria. The quality of the exam is good. The lumbar spine was excluded due to degenerative changes. Right hip excluded due to surgical hardware. Site Region Measured Date Measured Age YA BMD Significant CHANGE T-score Left Forearm Radius 33% 09/02/2022 79.7 -2.7 0.642 g/cm2 Left Femur Neck 09/02/2022 79.7 -1.4 0.847 g/cm2 World Health Organization [facility]) criteria for post-menopausal, Caucasian Women: Normal       T-score at or above -1 SD Osteopenia   T-score between -1 and -2.5 SD Osteoporosis T-score at or below -2.5 SD RECOMMENDATION: 1. All patients should optimize calcium and vitamin D intake. 2. Consider FDA-approved medical therapies in postmenopausal women and men aged 77 years and older, based on the following: a. A hip or vertebral (clinical or morphometric) fracture. b. T-score = -2.5 at the femoral neck or spine after appropriate evaluation to exclude secondary causes. c. Low bone mass (T-score between -1.0 and -2.5 at the femoral neck or spine) and a 10-year probability of a hip fracture = 3% or a 10-year probability of a major osteoporosis-related fracture = 20% based on the [place]-adapted WHO algorithm. d. Clinician judgment and/or patient preferences may indicate treatment for people with 10-year fracture probabilities above or below these levels. FOLLOW-UP: Patients with diagnosis of osteoporosis or at high risk for fracture should have regular bone mineral density tests.? Patients eligible for Medicare are allowed routine testing every 2 years.? The testing frequency can be increased to one year for patients who have rapidly progressing disease, are receiving or  discontinuing medical therapy to restore bone mass, or have additional risk factors. I have reviewed this study and agree with the findings. [facility]  Electronically Signed   By: Romona Curls M.D.   On: 09/02/2022 14:49    Assessment/Plan 1. 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 Controlled type 2 diabetes mellitus with complication, without long-term current use of insulin (HCC) A1C 6.4 07/15/22 No Meds 3. Situational depression Increase Remeron to 15 mg   4. Primary insomnia Remeron is helping Change the dose to 15 mg   5. Age-related osteoporosis without current pathological fracture Radius -2.7 Left Femur -1.4 Calcium and Vit D for now  6. Fall, subsequent encounter N o injuries Her gait is stable    Labs/tests ordered:   Next appt:  Visit date not found

## 2022-10-25 DIAGNOSIS — Z961 Presence of intraocular lens: Secondary | ICD-10-CM | POA: Diagnosis not present

## 2022-12-17 NOTE — Progress Notes (Unsigned)
Location:  [facility]  POS: Clinic  Provider: Fletcher Anon, ANP  Code Status:  Goals of Care:     09/14/2022   11:44 AM  Advanced Directives  Does Patient Have a Medical Advance Directive? Yes  Type of Estate agent of [place];Living will  Does patient want to make changes to medical advance directive? No - Patient declined  Copy of Healthcare Power of Attorney in Chart? Yes - validated most recent copy scanned in chart (See row information)     No chief complaint on file.   HPI: Patient is a 82 y.o. female seen today for medical management of chronic diseases.    PMH signficant for post right mastectomy chemo/XRT, arthritis, history of intraventricular hemorrhage after a fall,  DM2, depression, insomnia, hypertension, hyperlipidemia (did not tolerate statin), Alz dementia, and depression   Alzheimer's dementia followed by neurology MOCA 03/10/21 25/30, MMSE 28/30 12/2021.  Off aricept due to possible s/e. Now on exelon.   Did have orthostatic hypotension which resolved ?aricept vs dehydration  Hx of anxiety/depression for years.   [place] there was some signs of paranoia, depression, worsening memory loss.  Currently on zoloft, abilify,  and wellbutrin. Also on Remeron started June. Was refusing showers   DM : on metformin  Lab Results  Component Value Date   HGBA1C 6.4 07/15/2022    HLD: did not tolerate statin, was on repatha at one time.   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    History of COVID-19 2024   History of hysterectomy 03/18/2021   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   Hyperlipidemia 11/30/2018   Hyponatremia 09/01/2021   Knee pain 02/22/2017   Lactic acidosis 09/01/2021   Leukocytosis 09/01/2021   Lichen sclerosus 03/18/2021   Low back pain 02/22/2017   Malignant neoplasm of upper-outer quadrant of right female breast 1999   Mild dementia, concerns for Alzheimer's disease 06/22/2022   Orthostatic hypotension 09/01/2021   Pain in toe 12/06/2019   Personal history of chemotherapy    Personal history of radiation therapy    Right wrist sprain 09/01/2021   Syncope and collapse 09/01/2021   Unilateral primary osteoarthritis, 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    Past Surgical History:  Procedure Laterality Date   ABDOMINAL HYSTERECTOMY  1998   fibroid   BREAST LUMPECTOMY Right 1999   CHOLECYSTECTOMY     JOINT REPLACEMENT     L knee replacement   TONSILLECTOMY     TOTAL HIP ARTHROPLASTY Right 10/26/2019   Procedure: RIGHT TOTAL HIP ARTHROPLASTY ANTERIOR APPROACH;  Surgeon: Kathryne Hitch, MD;  Location: [facility];  Service: Orthopedics;  Laterality: Right;   WISDOM TOOTH EXTRACTION      No Known Allergies  Outpatient Encounter Medications as of 12/20/2022  Medication Sig   buPROPion (WELLBUTRIN XL) 150 MG 24 hr tablet Take 150 mg by mouth daily.   calcium carbonate (OSCAL) 1500 (600 Ca) MG TABS tablet Take 600 mg of elemental calcium by mouth daily with breakfast.   cholecalciferol (VITAMIN D3) 25 MCG (1000 UNIT) tablet Take  1,000 Units by mouth daily.   clobetasol ointment (TEMOVATE) 0.05 % Apply 1 Application topically as needed.   lactose free nutrition (BOOST) LIQD Take 237 mLs by mouth every morning.   melatonin 1 MG TABS tablet Take 1 mg by mouth at bedtime.   memantine (NAMENDA) 5 MG tablet Take 5 mg by mouth 2 (two) times daily.   metFORMIN (GLUCOPHAGE) 500 MG tablet Take 0.5 tablets (250 mg total) by mouth 2 (two) times daily with  a meal.   mirtazapine (REMERON) 7.5 MG tablet Take 15 mg by mouth at bedtime.   rivastigmine (EXELON) 9.5 mg/24hr Place 9.5 mg onto the skin daily.   sertraline (ZOLOFT) 50 MG tablet Take 25 mg by mouth at bedtime.   No facility-administered encounter medications on file as of 12/20/2022.    Review of Systems:  Review of Systems  Constitutional:  Negative for activity change, appetite change, chills, diaphoresis, fatigue, fever and unexpected weight change.  HENT:  Negative for congestion.   Respiratory:  Negative for cough, shortness of breath and wheezing.   Cardiovascular:  Negative for chest pain, palpitations and leg swelling.  Gastrointestinal:  Negative for abdominal distention, abdominal pain, constipation and diarrhea.  Genitourinary:  Negative for difficulty urinating and dysuria.  Musculoskeletal:  Positive for gait problem (uses walker). Negative for arthralgias, back pain, joint swelling and myalgias.  Neurological:  Negative for dizziness, tremors, seizures, syncope, facial asymmetry, speech difficulty, weakness, light-headedness, numbness and headaches.  Psychiatric/Behavioral:  Positive for confusion. Negative for agitation, behavioral problems, dysphoric mood and sleep disturbance. The patient is not nervous/anxious.        Memory loss     Health Maintenance  Topic Date Due   OPHTHALMOLOGY EXAM  Never done   Diabetic kidney evaluation - Urine ACR  Never done   DTaP/Tdap/Td (1 - Tdap) Never done   Zoster Vaccines- Shingrix (1 of 2) Never done   INFLUENZA VACCINE  09/16/2022   COVID-19 Vaccine (4 - 2023-24 season) 10/17/2022   FOOT EXAM  12/02/2022   Pneumonia Vaccine 61+ Years old (2 of 2 - PCV) 12/04/2022   HEMOGLOBIN A1C  01/15/2023   Diabetic kidney evaluation - eGFR measurement  07/15/2023   Medicare Annual Wellness (AWV)  08/30/2023   DEXA SCAN  Completed   HPV VACCINES  Aged Out    Physical Exam: There were no vitals filed for this visit.  There is no  height or weight on file to calculate BMI. Wt Readings from Last 3 Encounters:  09/14/22 159 lb (72.1 kg)  08/30/22 156 lb 9.6 oz (71 kg)  08/03/22 154 lb 3.2 oz (69.9 kg)    Physical Exam Vitals and nursing note reviewed.  Constitutional:      General: She is not in acute distress.    Appearance: She is not diaphoretic.  HENT:     Head: Normocephalic and atraumatic.     Right Ear: Tympanic membrane normal. There is no impacted cerumen.     Left Ear: Tympanic membrane normal. There is no impacted cerumen.     Nose: Nose normal.     Mouth/Throat:     Mouth: Mucous membranes are moist.     Pharynx: Oropharynx is clear.  Eyes:     Conjunctiva/sclera: Conjunctivae normal.     Pupils: Pupils are equal, round, and reactive to light.  Neck:     Vascular: No JVD.  Cardiovascular:     Rate and Rhythm: Normal rate and regular rhythm.  Heart sounds: No murmur heard. Pulmonary:     Effort: Pulmonary effort is normal. No respiratory distress.     Breath sounds: Normal breath sounds. No wheezing.  Abdominal:     General: Bowel sounds are normal. There is no distension.     Palpations: Abdomen is soft.  Musculoskeletal:     Cervical back: No rigidity or tenderness.     Right lower leg: No edema.     Left lower leg: No edema.  Lymphadenopathy:     Cervical: No cervical adenopathy.  Skin:    General: Skin is warm and dry.  Neurological:     Mental Status: She is alert and oriented to person, place, and time.  Psychiatric:        Mood and Affect: Mood normal.     Labs reviewed: Basic Metabolic Panel: Recent Labs    03/04/22 0000 07/15/22 0000  NA 135* 138  138  K 4.1 4.2  4.2  CL 100 103  103  CO2 22 23*  23*  BUN 11 9  9   CREATININE 0.6 0.7  0.7  CALCIUM 9.3 9.4  9.4  TSH 1.82  --    Liver Function Tests: Recent Labs    03/04/22 0000 07/15/22 0000  AST 26 24  24   ALT 26 22  22   ALKPHOS 69 62  62  ALBUMIN 3.9 4.0  4.0   No results for input(s):  "LIPASE", "AMYLASE" in the last 8760 hours. No results for input(s): "AMMONIA" in the last 8760 hours. CBC: Recent Labs    03/04/22 0000 07/15/22 0000  WBC 7.8 9.6  9.6  HGB 13.4 13.2  13.2  HCT 39 39  39  PLT 242 114*  114*   Lipid Panel: Recent Labs    03/04/22 0000  CHOL 201*  HDL 54  LDLCALC 133  TRIG 528   Lab Results  Component Value Date   HGBA1C 6.4 07/15/2022    Procedures since last visit: No results found.  Assessment/Plan  1. Situational depression She has adjusted to [facility] and her medication regimen Is effective at this time Would not taper due to risk of increased symptoms of depression/psychosis.  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 Mixed hyperlipidemia Lab Results  Component Value Date   LDLCALC 133 03/04/2022   Off repatha Continue to monitor and consider starting statin if worsening.    4. Controlled type 2 diabetes mellitus with complication, without long-term current use of insulin (HCC) A1C at goal  Continue metformin Has apt with ophthalmology  Urine micro ordered   5. Estrogen deficiency  - DG Bone Density; Future  6. Encounter for screening mammogram for malignant neoplasm of breast Hx of breast ca  - MM Digital Screening; Future  Ordered shingrix vaccine   Labs/tests ordered:  BMP A1C prior to next apt Next appt:     Total time :  time greater than 50% of total time spent doing pt counseling and coordination of care

## 2022-12-20 ENCOUNTER — Encounter: Payer: Self-pay | Admitting: Adult Health

## 2022-12-20 ENCOUNTER — Non-Acute Institutional Stay: Payer: Medicare PPO | Admitting: Adult Health

## 2022-12-20 VITALS — BP 138/82 | HR 114 | Temp 97.8°F | Resp 17 | Ht 65.0 in | Wt 165.0 lb

## 2022-12-20 DIAGNOSIS — F5101 Primary insomnia: Secondary | ICD-10-CM | POA: Diagnosis not present

## 2022-12-20 DIAGNOSIS — R000 Tachycardia, unspecified: Secondary | ICD-10-CM

## 2022-12-20 DIAGNOSIS — M810 Age-related osteoporosis without current pathological fracture: Secondary | ICD-10-CM | POA: Diagnosis not present

## 2022-12-20 DIAGNOSIS — E118 Type 2 diabetes mellitus with unspecified complications: Secondary | ICD-10-CM

## 2022-12-20 DIAGNOSIS — F4321 Adjustment disorder with depressed mood: Secondary | ICD-10-CM | POA: Diagnosis not present

## 2022-12-20 DIAGNOSIS — F02B3 Dementia in other diseases classified elsewhere, moderate, with mood disturbance: Secondary | ICD-10-CM | POA: Diagnosis not present

## 2022-12-20 DIAGNOSIS — G301 Alzheimer's disease with late onset: Secondary | ICD-10-CM | POA: Diagnosis not present

## 2022-12-21 DIAGNOSIS — E119 Type 2 diabetes mellitus without complications: Secondary | ICD-10-CM | POA: Diagnosis not present

## 2022-12-21 DIAGNOSIS — R000 Tachycardia, unspecified: Secondary | ICD-10-CM | POA: Diagnosis not present

## 2022-12-21 LAB — CBC AND DIFFERENTIAL
HCT: 38 (ref 36–46)
Hemoglobin: 13.1 (ref 12.0–16.0)
Platelets: 156 10*3/uL (ref 150–400)
WBC: 8.3

## 2022-12-21 LAB — HEMOGLOBIN A1C: Hemoglobin A1C: 7.5

## 2022-12-21 LAB — CBC: RBC: 4.23 (ref 3.87–5.11)

## 2023-01-02 DIAGNOSIS — E860 Dehydration: Secondary | ICD-10-CM | POA: Diagnosis not present

## 2023-01-03 ENCOUNTER — Telehealth: Payer: Self-pay | Admitting: Nurse Practitioner

## 2023-01-03 ENCOUNTER — Encounter: Payer: Self-pay | Admitting: Internal Medicine

## 2023-01-03 ENCOUNTER — Non-Acute Institutional Stay: Payer: Medicare PPO | Admitting: Internal Medicine

## 2023-01-03 DIAGNOSIS — J9811 Atelectasis: Secondary | ICD-10-CM | POA: Diagnosis not present

## 2023-01-03 DIAGNOSIS — F02B3 Dementia in other diseases classified elsewhere, moderate, with mood disturbance: Secondary | ICD-10-CM | POA: Diagnosis not present

## 2023-01-03 DIAGNOSIS — R55 Syncope and collapse: Secondary | ICD-10-CM

## 2023-01-03 DIAGNOSIS — D729 Disorder of white blood cells, unspecified: Secondary | ICD-10-CM | POA: Diagnosis not present

## 2023-01-03 DIAGNOSIS — G301 Alzheimer's disease with late onset: Secondary | ICD-10-CM | POA: Diagnosis not present

## 2023-01-03 DIAGNOSIS — E860 Dehydration: Secondary | ICD-10-CM | POA: Diagnosis not present

## 2023-01-03 DIAGNOSIS — R8290 Unspecified abnormal findings in urine: Secondary | ICD-10-CM | POA: Diagnosis not present

## 2023-01-03 DIAGNOSIS — J189 Pneumonia, unspecified organism: Secondary | ICD-10-CM | POA: Diagnosis not present

## 2023-01-03 DIAGNOSIS — J8410 Pulmonary fibrosis, unspecified: Secondary | ICD-10-CM | POA: Diagnosis not present

## 2023-01-03 DIAGNOSIS — D72829 Elevated white blood cell count, unspecified: Secondary | ICD-10-CM | POA: Diagnosis not present

## 2023-01-03 NOTE — Telephone Encounter (Signed)
Staff called 01/02/23, reported the patient's near syncope/twitching motions, normal VS, declined ED evaluation, stat labs showed Na 131, wbc 12.1. Had Shingrix 01/01/23. Recommended UA C/S and CXR, observe the patient.

## 2023-01-03 NOTE — Progress Notes (Signed)
Location:  [facility] Room Number: [phone] Place of Service:  ALF [phone]) Provider:  [name], MD   [name], MD  Patient Care Team: [name], MD as PCP - General (Internal Medicine)  Extended Emergency Contact Information Primary Emergency Contact: Nolon Stalls, [place] [address] [place] Home Phone: [phone] Mobile Phone: [phone] Relation: Daughter  Code Status:  Full Code  Goals of care: Advanced Directive information    01/03/2023   11:26 AM  Advanced Directives  Does Patient Have a Medical Advance Directive? Yes  Type of Advance Directive Living will;Healthcare Power of Attorney  Does patient want to make changes to medical advance directive? No - Patient declined  Copy of Healthcare Power of Attorney in Chart? Yes - validated most recent copy scanned in chart (See row information)     Chief Complaint  Patient presents with   Acute Visit    Patient is being seen for an acute visit     HPI:  Pt is a 82 y.o. female seen today for an acute visit for Follow up of her Syncopal Episode  atient lives in [facility]F in [facility]   She has h/o HLD Did not tolerate Statin H/o Breast Cancer s/p Right Mastectomy S/P Right hip Arthroplasty  in 9/21 H/o Intraventricular Hemorrhage in 10/16 after a fall Diabetes Type 2  Depression Insomnia  Dementia Alzheimer's dementia last MMSE 25 out of 30  Insomnia   Patient had Shingles shot 2 days ago followed by Episode of Vomiting Then yesterday she came out of her Room and Then Blacked out Nurses also Noticed Twitching and shaking ? Unresponsiveness And staring in the ceiling But She started responding after few seconds. 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   History of COVID-19 2024   History of hysterectomy 03/18/2021   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   Hyperlipidemia 11/30/2018   Hyponatremia 09/01/2021   Knee pain 02/22/2017   Lactic acidosis 09/01/2021   Leukocytosis 09/01/2021   Lichen sclerosus 03/18/2021   Low back pain 02/22/2017   Malignant neoplasm of upper-outer quadrant of right female breast 1999   Mild dementia, concerns for Alzheimer's disease 06/22/2022   Orthostatic hypotension 09/01/2021   Pain in toe 12/06/2019   Personal history of chemotherapy    Personal history of radiation therapy    Right wrist sprain 09/01/2021   Syncope and collapse 09/01/2021   Unilateral primary osteoarthritis, 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   Past Surgical History:  Procedure Laterality Date   ABDOMINAL HYSTERECTOMY  1998   fibroid   BREAST LUMPECTOMY Right 1999   CHOLECYSTECTOMY     JOINT REPLACEMENT     L knee replacement   TONSILLECTOMY     TOTAL HIP ARTHROPLASTY Right 10/26/2019   Procedure: RIGHT TOTAL HIP ARTHROPLASTY ANTERIOR APPROACH;  Surgeon: Kathryne Hitch,  MD;  Location: [facility];  Service: Orthopedics;  Laterality: Right;   WISDOM TOOTH EXTRACTION      No Known Allergies  Outpatient Encounter Medications as of 01/03/2023  Medication Sig   buPROPion (WELLBUTRIN XL) 150 MG 24 hr tablet Take 150 mg by mouth daily.   calcium carbonate (OSCAL) 1500 (600 Ca) MG TABS tablet Take 600 mg of elemental calcium by mouth daily with breakfast.   cholecalciferol (VITAMIN D3) 25 MCG (1000 UNIT) tablet Take 1,000 Units by mouth daily.    lactose free nutrition (BOOST) LIQD Take 237 mLs by mouth every morning.   melatonin 1 MG TABS tablet Take 1 mg by mouth at bedtime.   memantine (NAMENDA) 5 MG tablet Take 5 mg by mouth 2 (two) times daily.   metFORMIN (GLUCOPHAGE) 500 MG tablet Take 0.5 tablets (250 mg total) by mouth 2 (two) times daily with a meal.   mirtazapine (REMERON) 7.5 MG tablet Take 15 mg by mouth at bedtime.   rivastigmine (EXELON) 9.5 mg/24hr Place 9.5 mg onto the skin daily.   sertraline (ZOLOFT) 50 MG tablet Take 25 mg by mouth at bedtime.   No facility-administered encounter medications on file as of 01/03/2023.    Review of Systems  Constitutional:  Positive for activity change. Negative for appetite change.  HENT: Negative.    Respiratory:  Negative for cough and shortness of breath.   Cardiovascular:  Negative for leg swelling.  Gastrointestinal:  Negative for constipation.  Genitourinary: Negative.   Musculoskeletal:  Negative for arthralgias, gait problem and myalgias.  Skin: Negative.   Neurological:  Negative for dizziness and weakness.  Psychiatric/Behavioral:  Negative for dysphoric mood and sleep disturbance.     Immunization History  Administered Date(s) Administered   Fluad Quad(high Dose 65+) 11/20/2021, 12/07/2022   Moderna Covid-19 Vaccine Bivalent Booster 20yrs & up 12/07/2022   Moderna SARS-COV2 Booster Vaccination 01/01/2020   Moderna Sars-Covid-2 Vaccination 02/14/2019, 03/14/2019   Pneumococcal Polysaccharide-23 12/03/2021   Unspecified SARS-COV-2 Vaccination 12/21/2021   Pertinent  Health Maintenance Due  Topic Date Due   HEMOGLOBIN A1C  01/15/2023   OPHTHALMOLOGY EXAM  10/25/2023   FOOT EXAM  12/20/2023   INFLUENZA VACCINE  Completed   DEXA SCAN  Completed      07/22/2022   11:41 AM 08/03/2022   10:09 AM 08/30/2022    3:25 PM 09/14/2022    1:04 PM 12/20/2022    2:11 PM  Fall Risk  Falls in the past year? 1 0 0 1 0  Was there an injury with Fall? 0 0 0 0 0  Fall Risk  Category Calculator 1 0 0 1 0  Patient at Risk for Falls Due to  No Fall Risks No Fall Risks History of fall(s) No Fall Risks  Fall risk Follow up Falls evaluation completed Falls evaluation completed Falls evaluation completed Falls evaluation completed Falls evaluation completed   Functional Status Survey:    Vitals:   01/03/23 1123  BP: 129/75  Pulse: 77  Resp: 18  Temp: (!) 97.2 F (36.2 C)  TempSrc: Temporal  SpO2: 95%  Weight: 162 lb 6.4 oz (73.7 kg)  Height: 5\' 5"  (1.651 m)   Body mass index is 27.02 kg/m. Physical Exam Vitals reviewed.  Constitutional:      Appearance: Normal appearance.  HENT:     Head: Normocephalic.     Nose: Nose normal.     Mouth/Throat:     Mouth: Mucous membranes are moist.     Pharynx:  Oropharynx is clear.  Eyes:     Pupils: Pupils are equal, round, and reactive to light.  Cardiovascular:     Rate and Rhythm: Normal rate and regular rhythm.     Pulses: Normal pulses.     Heart sounds: Normal heart sounds. No murmur heard. Pulmonary:     Effort: Pulmonary effort is normal.     Breath sounds: Normal breath sounds.  Abdominal:     General: Abdomen is flat. Bowel sounds are normal.     Palpations: Abdomen is soft.  Musculoskeletal:        General: No swelling.     Cervical back: Neck supple.  Skin:    General: Skin is warm.  Neurological:     General: No focal deficit present.     Mental Status: She is alert.     Comments: Patient Stood up with No assist Not Dizzy  Psychiatric:        Mood and Affect: Mood normal.        Thought Content: Thought content normal.     Labs reviewed: Recent Labs    03/04/22 0000 07/15/22 0000  NA 135* 138  138  K 4.1 4.2  4.2  CL 100 103  103  CO2 22 23*  23*  BUN 11 9  9   CREATININE 0.6 0.7  0.7  CALCIUM 9.3 9.4  9.4   Recent Labs    03/04/22 0000 07/15/22 0000  AST 26 24  24   ALT 26 22  22   ALKPHOS 69 62  62  ALBUMIN 3.9 4.0  4.0   Recent Labs    03/04/22 0000  07/15/22 0000  WBC 7.8 9.6  9.6  HGB 13.4 13.2  13.2  HCT 39 39  39  PLT 242 114*  114*   Lab Results  Component Value Date   TSH 1.82 03/04/2022   Lab Results  Component Value Date   HGBA1C 6.4 07/15/2022   Lab Results  Component Value Date   CHOL 201 (A) 03/04/2022   HDL 54 03/04/2022   LDLCALC 133 03/04/2022   TRIG 109 03/04/2022    Significant Diagnostic Results in last 30 days:  No results found.  Assessment/Plan 1. Syncope and collapse Discussed with the daughter Patient is back to her Baseline Mildily Orthostatic Could be due to reaction to Shingrix Will Continue to Monitor for now If has any more episodes Consider ED eval /Neurology  2. Dehydration Can be due to her Vomiting after Shingrix Encourage PO Fluids  3. 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

## 2023-01-18 ENCOUNTER — Encounter: Payer: Medicare PPO | Admitting: Internal Medicine

## 2023-01-25 ENCOUNTER — Encounter: Payer: Self-pay | Admitting: Physician Assistant

## 2023-01-25 ENCOUNTER — Ambulatory Visit: Payer: Medicare PPO | Admitting: Physician Assistant

## 2023-01-25 VITALS — Resp 20 | Ht 65.0 in

## 2023-01-25 DIAGNOSIS — G309 Alzheimer's disease, unspecified: Secondary | ICD-10-CM | POA: Diagnosis not present

## 2023-01-25 DIAGNOSIS — F02A0 Dementia in other diseases classified elsewhere, mild, without behavioral disturbance, psychotic disturbance, mood disturbance, and anxiety: Secondary | ICD-10-CM | POA: Diagnosis not present

## 2023-01-25 NOTE — Progress Notes (Signed)
Assessment/Plan:   Dementia likely due to Alzheimer's disease without behavioral disturbance, late onset  Jodi Summers is a very pleasant 82 y.o. RH female with a history of breast cancer status post right mastectomy chemo/XRT, arthritis, history of intraventricular hemorrhage in October 2016 after a fall,  DM2, depression, insomnia, hypertension, hyperlipidemia, and situational depression diagnoses mild Alzheimer's dementia without behavioral disturbance per neuropsychological evaluation on 06/22/2022 seen today in follow up for memory loss. Patient is currently on rivastigmine patch 9.5 mg TD and memantine 5 mg twice daily.  She is able to participate on her ADLs at the ALF.  She no longer drives. Memory is stable. Mood is controlled.     Follow up in  6 months. Continue memantine 5 mg twice daily Continue rivastigmine 9.5 mg patch daily as per PCP, side effects discussed Recommend good control of her cardiovascular risk factors Continue to control mood as per PCP, she is on Wellbutrin, Zoloft, Abilify     Subjective:    This patient is accompanied in the office by her daughter who supplements the history.  Previous records as well as any outside records available were reviewed prior to todays visit. Patient was last seen on 07/22/2022   Any changes in memory since last visit? "About the same".  Daughter reports that she has difficulty with words but patient is not aware of these changes.  Sometimes she cannot remember her grandchildren's names, appointments or recent conversations.  She enjoys doing crossword puzzles.  At [facility] she enjoys all the activities offered there. repeats oneself?  Endorsed, especially with appointments. Disoriented when walking into a room?  Patient denies    Leaving objects?  May misplace things but not in unusual places   Wandering behavior?  denies "she does not go outside much ". Any personality changes since last visit?  denies    Any worsening depression?:  Denies.   Hallucinations or paranoia?  Denies.   Seizures? denies    Any sleep changes? Once in a while she may have nightmares, not frequent, REM behavior or sleepwalking   Sleep apnea?   Denies.   Any hygiene concerns?  She needs to be reminded.  Independent of bathing and dressing?  She may need some assistance in choosing her clothing.  Does the patient needs help with medications?  Facility is in charge  Who is in charge of the finances?  Daughter she is in charge    Any changes in appetite?  Denies, gained some weight    Patient have trouble swallowing? Denies.   Does the patient cook? No Any headaches?   denies   Chronic back pain  denies   Ambulates with difficulty?  She is not very active lately . Recent falls or head injuries?  She had a syncopal episode in November 2024 with subsequent fall.  She was unresponsive for a few seconds.  She was sent to the ER for further evaluation.  No head injury this was felt to be due to mild orthostatic hypotension and possibly to a reaction to Shingrix Unilateral weakness, numbness or tingling? denies   Any tremors?  Denies   Any anosmia?  Denies   Any incontinence of urine? Denies  Any bowel dysfunction?   Denies      Patient lives with  her daughter Does the patient drive? No longer drives    Initial Visit 03/04/21 The patient is seen in neurologic consultation at the request of Jodi Summers for the  evaluation of memory. The patient is accompanied by [name] who supplements the history. This is a 82 y.o. year old RH  female who has had memory issues for about 3 years.  The patient has moved from [place] during the pandemic to be near her daughter, and moved to [facility], which is reported to be an "exhausting experience, as she was presented with so many things to do and to many people introducing themselves, which appeared to be overwhelming to her ".  Her daughter reports  that this took a toll on her emotional and mental readiness.  Due to a noticeable cognitive decline, her daughter arranged Neuropsych evaluation by Jodi Summers from neuropsychology at [facility]) in 05/27/2020 yielding an normal examination.  Over the last 6 to 8 months, her daughter states that short-term memory is worse, sometimes not remembering that she asked the same question, or told the same story.  She has not been sleeping well either, tried low dose of melatonin without significant improvement.  Denies any vivid dreams, REM behavior, or sleepwalking.  She denies any hallucinations, but she uses a cane "just in case I need to fight somebody that tries to get in the apartment ".  She denies any confusion when coming to her room, or leaving objects in unusual places.  There is some degree of depression due to all this changes.  She enjoys doing crossword puzzles and word finding.  She still trying to adjust to independent living facility.  In October 2022 she had COVID, and was in isolation for about 10 days, which in turn appeared to be therapeutic, as she did not need to interact with other residents.  There are no hygiene concerns, she is independent of bathing and dressing.  Her medications are in a pillbox, occasionally she may forget the dose.  Daughter is in charge of the finances.  Appetite is fairly good, denies trouble swallowing.  She denies any recent falls.  She sustained a head injury in October 2016 after falling in the parking lot of her [employer], with loss of consciousness but with no reported retroactive amnesia, with intraventricular hemorrhage needing hospitalization.  She had posttraumatic amnestic period of less than 4 hours.  Denies headaches, double vision, dizziness, focal numbness or tingling, unilateral weakness or tremors or anosmia. No history of seizures. Denies urine incontinence, retention, constipation or diarrhea.  Denies OSA, ETOH or Tobacco.  Family History strong for Alzheimer's disease in sister and mother.  She is a retired Runner, broadcasting/film/video    CT scan performed in July 2016 reportedly revealed a linear hyperdensity within the posterior aspect of the lateral ventricles consistent with a small amount of intraventricular hemorrhage. There was also minimal blood products layering in the posterior horns of the lateral ventricles. Moreover, there was a 2 mm subdural hematoma lateral to the right temporal lobe. It was purportedly not hyperdense and therefore could be subacute. Focal hyperdensity in the right sylvian fissure  which possibly represent a minimal amount of traumatic subarachnoid hemorrhage.  A brain MRI scan performed in July 2016 reportedly revealed global parenchymal brain volume loss mildly advanced for age and mild white matter changes compatible with chronic small vessel ischemic disease.   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  An additional impairment was exhibited across cognitive flexibility, while relative weaknesses were exhibited across processing speed and semantic fluency. Variability was seen across confrontation naming. Relative to her previous evaluation in April 2023, mild decline was exhibited across aspects of learning and memory. Mild declines were also exhibited across processing speed, complex attention, cognitive flexibility, sentence repetition, and semantic fluency. Areas of decline do appear somewhat more pronounced when compared to testing performed in April 2022. I continue to have primary concerns surrounding an underlying neurodegenerative illness, namely Alzheimer's disease. Across memory testing, retention rates ranged from 0% to 50%  and she did exhibit some weaker performances across recognition trials. This continues to suggest evidence for rapid forgetting and an evolving storage impairment, both of which are the hallmark characteristics of this illness. These testing patterns continue to align with her and her daughter's description of worsening repetition and short-term memory loss in her day-to-day life. Additional weakness surrounding semantic fluency, confrontation naming, and cognitive flexibility do follow fairly traditional disease progression, further escalating concerns.   PREVIOUS MEDICATIONS:   CURRENT MEDICATIONS:  Outpatient Encounter Medications as of 01/25/2023  Medication Sig   buPROPion (WELLBUTRIN XL) 150 MG 24 hr tablet Take 150 mg by mouth daily.   calcium carbonate (OSCAL) 1500 (600 Ca) MG TABS tablet Take 600 mg of elemental calcium by mouth daily with breakfast.   cholecalciferol (VITAMIN D3) 25 MCG (1000 UNIT) tablet Take 1,000 Units by mouth daily.   lactose free nutrition (BOOST) LIQD Take 237 mLs by mouth every morning.   melatonin 1 MG TABS tablet Take 1 mg by mouth at bedtime.   memantine (NAMENDA) 5 MG tablet Take 5 mg by mouth 2 (two) times daily.   metFORMIN (GLUCOPHAGE) 500 MG tablet Take 0.5 tablets (250 mg total) by mouth 2 (two) times daily with a meal.   mirtazapine (REMERON) 15 MG tablet SMARTSIG:1.0 Tablet(s) By Mouth Every Night   mirtazapine (REMERON) 7.5 MG tablet Take 15 mg by mouth at bedtime.   rivastigmine (EXELON) 9.5 mg/24hr Place 9.5 mg onto the skin daily.   sertraline (ZOLOFT) 50 MG tablet Take 25 mg by mouth at bedtime.   No facility-administered encounter medications on file as of 01/25/2023.       08/30/2022    3:33 PM 07/20/2021    1:54 PM  MMSE - Mini Mental State Exam  Orientation to time 1 4  Orientation to Place 5 4  Registration 3 3  Attention/ Calculation 5 1  Recall 3 3  Language- name 2 objects 2 2  Language- repeat 1 1  Language- follow 3 step  command 3 3  Language- read & follow direction 1 1  Write a sentence 1 1  Copy design 1 1  Total score 26 24      03/05/2021    6:00 AM  Montreal Cognitive Assessment   Visuospatial/ Executive (0/5) 5  Naming (0/3) 2  Attention: Read list of digits (0/2) 2  Attention: Read list of letters (0/1) 1  Attention: Serial 7 subtraction starting at 100 (0/3) 3  Language: Repeat phrase (0/2) 1  Language : Fluency (0/1) 1  Abstraction (0/2) 1  Delayed Recall (0/5) 3  Orientation (0/6) 6  Total 25  Adjusted Score (based on education) 25    Objective:     PHYSICAL EXAMINATION:    VITALS:   Vitals:   01/25/23 1133  Resp: 20  Height: 5\' 5"  (1.651 m)    GEN:  The patient appears stated age  and is in NAD. HEENT:  Normocephalic, atraumatic.   Neurological examination:  General: NAD, well-groomed, appears stated age. Orientation: The patient is alert. Oriented to person, place and not to date Cranial nerves: There is good facial symmetry.The speech is fluent and clear. No aphasia or dysarthria. Fund of knowledge is appropriate. Recent and remote memory are impaired. Attention and concentration are reduced.  Able to name objects and repeat phrases.  Hearing is intact to conversational tone.  Sensation: Sensation is intact to light touch throughout Motor: Strength is at least antigravity x4. DTR's 2/4 in UE/LE     Movement examination: Tone: There is normal tone in the UE/LE Abnormal movements:  no tremor.  No myoclonus.  No asterixis.   Coordination:  There is no decremation with RAM's. Normal finger to nose  Gait and Station: The patient has no difficulty arising out of a deep-seated chair without the use of the hands. The patient's stride length is good.  Gait is cautious and narrow.    Thank you for allowing [place] the opportunity to participate in the care of this nice patient. Please do not hesitate to contact us for any questions or concerns.   Total time spent on today's  visit was 32 minutes dedicated to this patient today, preparing to see patient, examining the patient, ordering tests and/or medications and counseling the patient, documenting clinical information in the EHR or other health record, independently interpreting results and communicating results to the patient/family, discussing treatment and goals, answering patient's questions and coordinating care.  Cc:  Jodi Summers  Marlowe Kays 01/25/2023 11:45 AM

## 2023-01-25 NOTE — Patient Instructions (Signed)
It was a pleasure to see you today at our office.   Recommendations:  Follow up June 13 , 11:30  Continue rivastigmine 9.5  mg patch .  Memantine 5 mg twice a day  Continue the other medicines as prescribed       Whom to call:  Memory  decline, memory medications: Call out office [phone]   For psychiatric meds, mood meds: Please have your primary care physician manage these medications.     For assessment of decision of mental capacity and competency:  Call Dr. Erick Blinks, geriatric psychiatrist at [phone]  For guidance in geriatric dementia issues please call [facility] [phone]   If you have any severe symptoms of a stroke, or other severe issues such as confusion,severe chills or fever, etc call 911 or go to the ER as you may need to be evaluate further     RECOMMENDATIONS FOR ALL PATIENTS WITH MEMORY PROBLEMS: 1. Continue to exercise (Recommend 30 minutes of walking everyday, or 3 hours every week) 2. Increase social interactions - continue going to [place] and enjoy social gatherings with friends and family 3. Eat healthy, avoid fried foods and eat more fruits and vegetables 4. Maintain adequate blood pressure, blood sugar, and blood cholesterol level. Reducing the risk of stroke and cardiovascular disease also helps promoting better memory. 5. Avoid stressful situations. Live a simple life and avoid aggravations. Organize your time and prepare for the next day in anticipation. 6. Sleep well, avoid any interruptions of sleep and avoid any distractions in the bedroom that may interfere with adequate sleep quality 7. Avoid sugar, avoid sweets as there is a strong link between excessive sugar intake, diabetes, and cognitive impairment We discussed the Mediterranean diet, which has been shown to help patients reduce the risk of progressive memory disorders and reduces cardiovascular risk. This includes eating fish, eat fruits and green leafy  vegetables, nuts like almonds and hazelnuts, walnuts, and also use olive oil. Avoid fast foods and fried foods as much as possible. Avoid sweets and sugar as sugar use has been linked to worsening of memory function.  There is always a concern of gradual progression of memory problems. If this is the case, then we may need to adjust level of care according to patient needs. Support, both to the patient and caregiver, should then be put into place.    The Alzheimer[employer] Association is here all day, every day for people facing Alzheimer's disease through our free 24/7 Helpline: 5098671203. The Helpline provides reliable information and support to all those who need assistance, such as individuals living with memory loss, Alzheimer's or other dementia, caregivers, health care professionals and the public.  Our highly trained and knowledgeable staff can help you with: Understanding memory loss, dementia and Alzheimer's  Medications and other treatment options  General information about aging and brain health  Skills to provide quality care and to find the best care from professionals  Legal, financial and living-arrangement decisions Our Helpline also features: Confidential care consultation provided by master's level clinicians who can help with decision-making support, crisis assistance and education on issues families face every day  Help in a caller's preferred language using our translation service that features more than 200 languages and dialects  Referrals to local community programs, services and ongoing support     FALL PRECAUTIONS: Be cautious when walking. Scan the area for obstacles that may increase the risk of trips and falls. When getting up in the mornings, sit up  at the edge of the bed for a few minutes before getting out of bed. Consider elevating the bed at the head end to avoid drop of blood pressure when getting up. Walk always in a well-lit room (use night lights in the walls).  Avoid area rugs or power cords from appliances in the middle of the walkways. Use a walker or a cane if necessary and consider physical therapy for balance exercise. Get your eyesight checked regularly.  FINANCIAL OVERSIGHT: Supervision, especially oversight when making financial decisions or transactions is also recommended.  HOME SAFETY: Consider the safety of the kitchen when operating appliances like stoves, microwave oven, and blender. Consider having supervision and share cooking responsibilities until no longer able to participate in those. Accidents with firearms and other hazards in the house should be identified and addressed as well.   ABILITY TO BE LEFT ALONE: If patient is unable to contact 911 operator, consider using LifeLine, or when the need is there, arrange for someone to stay with patients. Smoking is a fire hazard, consider supervision or cessation. Risk of wandering should be assessed by caregiver and if detected at any point, supervision and safe proof recommendations should be instituted.            Mediterranean Diet A Mediterranean diet refers to food and lifestyle choices that are based on the traditions of countries located on the [employer]. This way of eating has been shown to help prevent certain conditions and improve outcomes for people who have chronic diseases, like kidney disease and heart disease. What are tips for following this plan? Lifestyle  Cook and eat meals together with your family, when possible. Drink enough fluid to keep your urine clear or pale yellow. Be physically active every day. This includes: Aerobic exercise like running or swimming. Leisure activities like gardening, walking, or housework. Get 7-8 hours of sleep each night. If recommended by your health care provider, drink red wine in moderation. This means 1 glass a day for nonpregnant women and 2 glasses a day for men. A glass of wine equals 5 oz (150 mL). Reading food  labels  Check the serving size of packaged foods. For foods such as rice and pasta, the serving size refers to the amount of cooked product, not dry. Check the total fat in packaged foods. Avoid foods that have saturated fat or trans fats. Check the ingredients list for added sugars, such as corn syrup. Shopping  At the grocery store, buy most of your food from the areas near the walls of the store. This includes: Fresh fruits and vegetables (produce). Grains, beans, nuts, and seeds. Some of these may be available in unpackaged forms or large amounts (in bulk). Fresh seafood. Poultry and eggs. Low-fat dairy products. Buy whole ingredients instead of prepackaged foods. Buy fresh fruits and vegetables in-season from local farmers markets. Buy frozen fruits and vegetables in resealable bags. If you do not have access to quality fresh seafood, buy precooked frozen shrimp or canned fish, such as tuna, salmon, or sardines. Buy small amounts of raw or cooked vegetables, salads, or olives from the deli or salad bar at your store. Stock your pantry so you always have certain foods on hand, such as olive oil, canned tuna, canned tomatoes, rice, pasta, and beans. Cooking  Cook foods with extra-virgin olive oil instead of using butter or other vegetable oils. Have meat as a side dish, and have vegetables or grains as your main dish. This means having meat in  small portions or adding small amounts of meat to foods like pasta or stew. Use beans or vegetables instead of meat in common dishes like chili or lasagna. Experiment with different cooking methods. Try roasting or broiling vegetables instead of steaming or sauteing them. Add frozen vegetables to soups, stews, pasta, or rice. Add nuts or seeds for added healthy fat at each meal. You can add these to yogurt, salads, or vegetable dishes. Marinate fish or vegetables using olive oil, lemon juice, garlic, and fresh herbs. Meal planning  Plan to eat 1  vegetarian meal one day each week. Try to work up to 2 vegetarian meals, if possible. Eat seafood 2 or more times a week. Have healthy snacks readily available, such as: Vegetable sticks with hummus. Greek yogurt. Fruit and nut trail mix. Eat balanced meals throughout the week. This includes: Fruit: 2-3 servings a day Vegetables: 4-5 servings a day Low-fat dairy: 2 servings a day Fish, poultry, or lean meat: 1 serving a day Beans and legumes: 2 or more servings a week Nuts and seeds: 1-2 servings a day Whole grains: 6-8 servings a day Extra-virgin olive oil: 3-4 servings a day Limit red meat and sweets to only a few servings a month What are my food choices? Mediterranean diet Recommended Grains: Whole-grain pasta. Brown rice. Bulgar wheat. Polenta. Couscous. Whole-wheat bread. Orpah Cobb. Vegetables: Artichokes. Beets. Broccoli. Cabbage. Carrots. Eggplant. Green beans. Chard. Kale. Spinach. Onions. Leeks. Peas. Squash. Tomatoes. Peppers. Radishes. Fruits: Apples. Apricots. Avocado. Berries. Bananas. Cherries. Dates. Figs. Grapes. Lemons. Melon. Oranges. Peaches. Plums. Pomegranate. Meats and other protein foods: Beans. Almonds. Sunflower seeds. Pine nuts. Peanuts. Cod. Salmon. Scallops. Shrimp. Tuna. Tilapia. Clams. Oysters. Eggs. Dairy: Low-fat milk. Cheese. Greek yogurt. Beverages: Water. Red wine. Herbal tea. Fats and oils: Extra virgin olive oil. Avocado oil. Grape seed oil. Sweets and desserts: [place] yogurt with honey. Baked apples. Poached pears. Trail mix. Seasoning and other foods: Basil. Cilantro. Coriander. Cumin. Mint. Parsley. Sage. Rosemary. Tarragon. Garlic. Oregano. Thyme. Pepper. Balsalmic vinegar. Tahini. Hummus. Tomato sauce. Olives. Mushrooms. Limit these Grains: Prepackaged pasta or rice dishes. Prepackaged cereal with added sugar. Vegetables: Deep fried potatoes (french fries). Fruits: Fruit canned in syrup. Meats and other protein foods: Beef. Pork. Lamb.  Poultry with skin. Hot dogs. Tomasa Blase. Dairy: Ice cream. Sour cream. Whole milk. Beverages: Juice. Sugar-sweetened soft drinks. Beer. Liquor and spirits. Fats and oils: Butter. Canola oil. Vegetable oil. Beef fat (tallow). Lard. Sweets and desserts: Cookies. Cakes. Pies. Candy. Seasoning and other foods: Mayonnaise. Premade sauces and marinades. The items listed may not be a complete list. Talk with your dietitian about what dietary choices are right for you. Summary The Mediterranean diet includes both food and lifestyle choices. Eat a variety of fresh fruits and vegetables, beans, nuts, seeds, and whole grains. Limit the amount of red meat and sweets that you eat. Talk with your health care provider about whether it is safe for you to drink red wine in moderation. This means 1 glass a day for nonpregnant women and 2 glasses a day for men. A glass of wine equals 5 oz (150 mL). This information is not intended to replace advice given to you by your health care provider. Make sure you discuss any questions you have with your health care provider.

## 2023-04-14 DIAGNOSIS — E1110 Type 2 diabetes mellitus with ketoacidosis without coma: Secondary | ICD-10-CM | POA: Diagnosis not present

## 2023-04-14 LAB — BASIC METABOLIC PANEL
BUN: 11 (ref 4–21)
CO2: 21 (ref 13–22)
Chloride: 103 (ref 99–108)
Creatinine: 0.7 (ref 0.5–1.1)
Glucose: 215
Potassium: 4.1 meq/L (ref 3.5–5.1)
Sodium: 137 (ref 137–147)

## 2023-04-14 LAB — COMPREHENSIVE METABOLIC PANEL
Calcium: 8.8 (ref 8.7–10.7)
eGFR: 85

## 2023-04-19 ENCOUNTER — Non-Acute Institutional Stay: Payer: Medicare PPO | Admitting: Internal Medicine

## 2023-04-19 ENCOUNTER — Encounter: Payer: Self-pay | Admitting: Internal Medicine

## 2023-04-19 VITALS — BP 136/78 | HR 103 | Temp 98.2°F | Resp 22 | Ht 65.0 in | Wt 165.2 lb

## 2023-04-19 DIAGNOSIS — G301 Alzheimer's disease with late onset: Secondary | ICD-10-CM | POA: Diagnosis not present

## 2023-04-19 DIAGNOSIS — F02B3 Dementia in other diseases classified elsewhere, moderate, with mood disturbance: Secondary | ICD-10-CM

## 2023-04-19 DIAGNOSIS — E118 Type 2 diabetes mellitus with unspecified complications: Secondary | ICD-10-CM | POA: Diagnosis not present

## 2023-04-19 DIAGNOSIS — F5101 Primary insomnia: Secondary | ICD-10-CM | POA: Diagnosis not present

## 2023-04-19 DIAGNOSIS — F4321 Adjustment disorder with depressed mood: Secondary | ICD-10-CM | POA: Diagnosis not present

## 2023-04-19 NOTE — Progress Notes (Signed)
 Location:  [facility] of Service:  Clinic ([id number])  Provider:   Code Status: Full Code Goals of Care:     04/19/2023    9:38 AM  Advanced Directives  Does Patient Have a Medical Advance Directive? Yes  Type of Estate agent of [place];Living will  Does patient want to make changes to medical advance directive? No - Patient declined  Copy of Healthcare Power of Attorney in Chart? Yes - validated most recent copy scanned in chart (See row information)     Chief Complaint  Patient presents with   Medical Management of Chronic Issues    4 month follow up.patient having trouble with sleep     Discuss the need for pneumonia, Tdap and shingles.    HPI: Patient is a 83 y.o. female seen today for medical management of chronic diseases.    Patient lives in ALF in [place]   She has h/o HLD Did not tolerate Statin H/o Breast Cancer s/p Right Mastectomy S/P Right hip Arthroplasty  in 9/21 H/o Intraventricular Hemorrhage in 10/16 after a fall Diabetes Type 2  Depression Insomnia  Dementia Alzheimer's dementia last MMSE 26 out of 30 08/2022 Insomnia   Discussed the use of AI scribe software for clinical note transcription with the patient, who gave verbal consent to proceed.  History of Present Illness   Here with her Daughter Doing well  Needs Cueing for her ADLS Refuses Showers sometimes Does not like to get up in the morning Depression and Paranoia is better [facility] with no assist No Falls  She is active within her living community, participating in card games, reading, and attending various events. . She also reports some difficulty with hearing, having tried hearing aids in the past but found them uncomfortable.  She has a history of poor reaction to the shingles vaccine, having experienced severe vomiting and dehydration after administration. She has a family who visits regularly and assists with her care.       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   Hyperlipidemia 11/30/2018   Hyponatremia 09/01/2021   Knee pain 02/22/2017   Lactic acidosis 09/01/2021   Leukocytosis 09/01/2021   Lichen sclerosus 03/18/2021   Low back pain 02/22/2017   Malignant neoplasm of upper-outer quadrant of right female breast 1999   Mild dementia, concerns for Alzheimer's disease 06/22/2022   Orthostatic hypotension 09/01/2021   Pain in toe 12/06/2019   Personal history of chemotherapy    Personal history of radiation therapy    Right wrist sprain 09/01/2021   Syncope and collapse 09/01/2021   Unilateral primary osteoarthritis, 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   calcium carbonate (OSCAL) 1500 (600 Ca) MG TABS tablet Take 600 mg of elemental calcium by mouth daily with breakfast.   cholecalciferol (VITAMIN D3) 25 MCG (1000 UNIT) tablet Take 1,000 Units by mouth daily.   melatonin 1 MG TABS  tablet Take 1 mg by mouth at bedtime.   memantine (NAMENDA) 5 MG tablet Take 5 mg by mouth 2 (two) times daily.   metFORMIN (GLUCOPHAGE) 500 MG tablet Take 0.5 tablets (250 mg total) by mouth 2 (two) times daily with a meal.   mirtazapine (REMERON) 15 MG tablet    mirtazapine (REMERON) 7.5 MG tablet Take 15 mg by mouth at bedtime.   rivastigmine (EXELON) 9.5 mg/24hr Place 9.5 mg onto the skin daily.   sertraline (ZOLOFT) 50 MG tablet Take 25 mg by mouth at bedtime.   lactose free nutrition (BOOST) LIQD Take 237 mLs by mouth every morning. (Patient not taking: Reported on 04/19/2023)   No facility-administered encounter medications on file as of 04/19/2023.    Review of Systems:  Review of Systems  Constitutional:  Negative for activity change and appetite change.  HENT: Negative.    Respiratory:  Negative for cough and shortness of breath.   Cardiovascular:  Negative for leg swelling.  Gastrointestinal:  Negative for constipation.  Genitourinary: Negative.   Musculoskeletal:  Negative for arthralgias, gait problem and myalgias.  Skin: Negative.   Neurological:  Negative for dizziness and weakness.  Psychiatric/Behavioral:  Positive for confusion. Negative for dysphoric mood and sleep disturbance.     Health Maintenance  Topic Date Due   DTaP/Tdap/Td (1 - Tdap) Never done   Pneumonia Vaccine 16+ Years old (2 of 2 - PCV) 12/04/2022   COVID-19 Vaccine (5 - 2024-25 season) 05/05/2023 (Originally 02/01/2023)   Zoster Vaccines- Shingrix (2 of 2) 04/25/2023   HEMOGLOBIN A1C  06/20/2023   Medicare Annual Wellness (AWV)  08/30/2023   Diabetic kidney evaluation - Urine ACR  09/07/2023   OPHTHALMOLOGY EXAM  10/25/2023   FOOT EXAM  12/20/2023   Diabetic kidney evaluation - eGFR measurement  04/13/2024   INFLUENZA VACCINE  Completed   DEXA SCAN  Completed   HPV VACCINES  Aged Out    Physical Exam: Vitals:   04/19/23 0915  BP: 136/78  Pulse: (!) 103  Resp: (!) 22  Temp: 98.2 F (36.8  C)  TempSrc: Temporal  SpO2: 98%  Weight: 165 lb 3.2 oz (74.9 kg)  Height: 5\' 5"  (1.651 m)   Body mass index is 27.49 kg/m. Physical Exam Vitals reviewed.  Constitutional:      Appearance: Normal appearance.  HENT:     Head: Normocephalic.     Right Ear: Tympanic membrane normal.     Left Ear: Tympanic membrane normal.     Ears:     Comments: Wax in both ears    Nose: Nose normal.     Mouth/Throat:     Mouth: Mucous membranes are moist.     Pharynx: Oropharynx is clear.  Eyes:     Pupils: Pupils are equal, round, and reactive to light.  Cardiovascular:     Rate and Rhythm: Normal rate and regular rhythm.     Pulses: Normal pulses.  Heart sounds: Normal heart sounds. No murmur heard. Pulmonary:     Effort: Pulmonary effort is normal.     Breath sounds: Normal breath sounds.  Abdominal:     General: Abdomen is flat. Bowel sounds are normal.     Palpations: Abdomen is soft.  Musculoskeletal:        General: No swelling.     Cervical back: Neck supple.  Skin:    General: Skin is warm.  Neurological:     General: No focal deficit present.     Mental Status: She is alert.  Psychiatric:        Mood and Affect: Mood normal.        Thought Content: Thought content normal.     Labs reviewed: Basic Metabolic Panel: Recent Labs    07/15/22 0000 04/14/23 0000  NA 138  138 137  K 4.2  4.2 4.1  CL 103  103 103  CO2 23*  23* 21  BUN 9  9 11   CREATININE 0.7  0.7 0.7  CALCIUM 9.4  9.4 8.8   Liver Function Tests: Recent Labs    07/15/22 0000  AST 24  24  ALT 22  22  ALKPHOS 62  62  ALBUMIN 4.0  4.0   No results for input(s): "LIPASE", "AMYLASE" in the last 8760 hours. No results for input(s): "AMMONIA" in the last 8760 hours. CBC: Recent Labs    07/15/22 0000 12/21/22 0000  WBC 9.6  9.6 8.3  HGB 13.2  13.2 13.1  HCT 39  39 38  PLT 114*  114* 156   Lipid Panel: No results for input(s): "CHOL", "HDL", "LDLCALC", "TRIG", "CHOLHDL",  "LDLDIRECT" in the last 8760 hours. Lab Results  Component Value Date   HGBA1C 7.5 12/21/2022    Procedures since last visit: No results found.  Assessment/Plan Assessment and Plan   Controlled type 2 diabetes mellitus with complication, without long-term current use of insulin (HCC)   Recent blood work showed elevated blood sugar levels.A1C was not done Patient has been on Metformin 0.5mg  twice daily. -Increase Metformin to 500mg  twice daily. -Check A1C in 3 months.  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 -Increase Memantine to 10mg  BID in 4 weeks. Also on Exelon patch Did not tolerate Aricept Cerumen Impaction Examination revealed cerumen in the ears. -Order ear wash to be administered by nursing staff in 4 weeks.  Primary insomnia Remeron is helping Also on Melatonin Situational depression  Remeron and Wellbutrin and zoloft  General Health Maintenance -Order pneumonia vaccine. -Plan mammogram as it is due (last done in March 2024). -Repeat bone density test in July 2026 (last done in July 2024). -Check thyroid and Vitamin D levels in 3 months.         Labs/tests ordered:  CBC,CMP,TSH,Vit d  , A1C  Next appt:  Visit date not found

## 2023-06-02 DIAGNOSIS — K080 Exfoliation of teeth due to systemic causes: Secondary | ICD-10-CM | POA: Diagnosis not present

## 2023-07-22 DIAGNOSIS — E118 Type 2 diabetes mellitus with unspecified complications: Secondary | ICD-10-CM | POA: Diagnosis not present

## 2023-07-22 DIAGNOSIS — E559 Vitamin D deficiency, unspecified: Secondary | ICD-10-CM | POA: Diagnosis not present

## 2023-07-22 DIAGNOSIS — E785 Hyperlipidemia, unspecified: Secondary | ICD-10-CM | POA: Diagnosis not present

## 2023-07-22 DIAGNOSIS — E119 Type 2 diabetes mellitus without complications: Secondary | ICD-10-CM | POA: Diagnosis not present

## 2023-07-22 DIAGNOSIS — F32A Depression, unspecified: Secondary | ICD-10-CM | POA: Diagnosis not present

## 2023-07-22 LAB — HEPATIC FUNCTION PANEL
ALT: 59 U/L — AB (ref 7–35)
ALT: 59 U/L — AB (ref 7–35)
AST: 39 — AB (ref 13–35)
AST: 39 — AB (ref 13–35)
Alkaline Phosphatase: 65 (ref 25–125)
Alkaline Phosphatase: 65 (ref 25–125)
Bilirubin, Total: 0.2
Bilirubin, Total: 0.2

## 2023-07-22 LAB — COMPREHENSIVE METABOLIC PANEL WITH GFR
Albumin: 3.8 (ref 3.5–5.0)
Albumin: 3.8 (ref 3.5–5.0)
Calcium: 8.9 (ref 8.7–10.7)
Calcium: 8.9 (ref 8.7–10.7)
Globulin: 2.5
Globulin: 2.5
eGFR: 77
eGFR: 77

## 2023-07-22 LAB — BASIC METABOLIC PANEL WITH GFR
BUN: 11 (ref 4–21)
BUN: 11 (ref 4–21)
CO2: 22 (ref 13–22)
CO2: 22 (ref 13–22)
Chloride: 103 (ref 99–108)
Chloride: 103 (ref 99–108)
Creatinine: 0.8 (ref 0.5–1.1)
Creatinine: 0.8 (ref 0.5–1.1)
Glucose: 223
Glucose: 223
Potassium: 3.9 meq/L (ref 3.5–5.1)
Potassium: 3.9 meq/L (ref 3.5–5.1)
Sodium: 138 (ref 137–147)
Sodium: 138 (ref 137–147)

## 2023-07-22 LAB — CBC AND DIFFERENTIAL
HCT: 38 (ref 36–46)
HCT: 38 (ref 36–46)
Hemoglobin: 12.9 (ref 12.0–16.0)
Hemoglobin: 12.9 (ref 12.0–16.0)
Platelets: 170 10*3/uL (ref 150–400)
Platelets: 170 K/uL (ref 150–400)
WBC: 9.7
WBC: 9.7

## 2023-07-22 LAB — TSH
TSH: 1.79 (ref 0.41–5.90)
TSH: 1.79 (ref 0.41–5.90)

## 2023-07-22 LAB — CBC
RBC: 4.2 (ref 3.87–5.11)
RBC: 4.2 (ref 3.87–5.11)

## 2023-07-22 LAB — VITAMIN D 25 HYDROXY (VIT D DEFICIENCY, FRACTURES): Vit D, 25-Hydroxy: 31.7

## 2023-07-22 LAB — HEMOGLOBIN A1C: Hemoglobin A1C: 7.6

## 2023-07-25 ENCOUNTER — Encounter: Payer: Self-pay | Admitting: Adult Health

## 2023-07-25 ENCOUNTER — Non-Acute Institutional Stay: Admitting: Adult Health

## 2023-07-25 VITALS — BP 124/70 | HR 106 | Temp 97.3°F | Ht 65.0 in | Wt 161.4 lb

## 2023-07-25 DIAGNOSIS — T50Z95D Adverse effect of other vaccines and biological substances, subsequent encounter: Secondary | ICD-10-CM

## 2023-07-25 DIAGNOSIS — F0390 Unspecified dementia without behavioral disturbance: Secondary | ICD-10-CM | POA: Diagnosis not present

## 2023-07-25 DIAGNOSIS — E876 Hypokalemia: Secondary | ICD-10-CM

## 2023-07-25 DIAGNOSIS — E559 Vitamin D deficiency, unspecified: Secondary | ICD-10-CM

## 2023-07-25 DIAGNOSIS — E118 Type 2 diabetes mellitus with unspecified complications: Secondary | ICD-10-CM

## 2023-07-25 DIAGNOSIS — F5101 Primary insomnia: Secondary | ICD-10-CM

## 2023-07-25 DIAGNOSIS — E782 Mixed hyperlipidemia: Secondary | ICD-10-CM

## 2023-07-25 DIAGNOSIS — R7989 Other specified abnormal findings of blood chemistry: Secondary | ICD-10-CM

## 2023-07-25 MED ORDER — METFORMIN HCL 500 MG PO TABS: 500.0000 mg | ORAL_TABLET | Freq: Two times a day (BID) | ORAL | Status: DC

## 2023-07-25 NOTE — Progress Notes (Signed)
 Location:  [facility]  POS: Clinic  Provider: Raylene Calamity, ANP  Code Status: Full Goals of Care:     04/19/2023    9:38 AM  Advanced Directives  Does Patient Have a Medical Advance Directive? Yes  Type of Estate agent of [place];Living will  Does patient want to make changes to medical advance directive? No - Patient declined  Copy of Healthcare Power of Attorney in Chart? Yes - validated most recent copy scanned in chart (See row information)     Chief Complaint  Patient presents with   Medical Management of Chronic Issues    3 month follow up. Discuss the need for A1c, MALB, and Shingrix. NCIR and Matrix verified.    HPI: Discussed the use of AI scribe software for clinical note transcription with the patient, who gave verbal consent to proceed.  History of Present Illness   Jodi Summers is an 83 year old female with type 2 diabetes and dementia who presents for lab review and medication management.  Her recent laboratory results show an A1c of 7.6, slightly increased from 7.5, and a fasting glucose of 223 mg/dL. She has been on an increased dose of metformin , from 250 mg to 500 mg, since April 20, 2023. There have been no changes in her diet or lifestyle, and she engages in minimal physical activity at her assisted living facility.  She has a history of dementia, and her Namenda dose was increased from 5 mg to 10 mg on May 20, 2023. She has not experienced any side effects such as stomach upset from this medication.  Her liver function tests revealed an ALT of 59 and AST of 39, which are slightly elevated. She has a history of gallbladder removal and does not experience pain when eating. She does not consume alcohol or smoke.  In January 2025, she experienced a severe reaction to the shingles vaccine, including vomiting and dehydration, which led to syncope.  Her vitamin D level was 31.7 ng/mL, which is on the low end of normal. She currently  takes 1000 units of vitamin D daily.  She takes Remeron, Wellbutrin, and Zoloft  for mood and sleep, and melatonin to aid sleep. She experiences occasional sleepless nights despite this regimen.  She has a history of breast cancer and has undergone a lumpectomy, hysterectomy, hip replacement, and knee replacement. She reports no current pain in her joints.      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 Mild chronic small vessel ischemic changes, mild to moderate cerebral atrophy  HLD: did not tolerate statin, was on repatha  at one time.   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   History of COVID-19 2024   History of hysterectomy 03/18/2021   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   Hyperlipidemia 11/30/2018   Hyponatremia 09/01/2021   Knee pain 02/22/2017   Lactic acidosis 09/01/2021   Leukocytosis 09/01/2021   Lichen sclerosus 03/18/2021  Low back pain 02/22/2017   Malignant neoplasm of upper-outer quadrant of right female breast 1999   Mild dementia, concerns for Alzheimer's disease 06/22/2022   Orthostatic hypotension 09/01/2021   Pain in toe 12/06/2019   Personal history of chemotherapy    Personal history of radiation therapy    Right wrist sprain 09/01/2021   Syncope and collapse 09/01/2021   Unilateral primary osteoarthritis, 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    Past Surgical History:  Procedure  Laterality Date   ABDOMINAL HYSTERECTOMY  1998   fibroid   BREAST LUMPECTOMY Right 1999   CHOLECYSTECTOMY     JOINT REPLACEMENT     L knee replacement   TONSILLECTOMY     TOTAL HIP ARTHROPLASTY Right 10/26/2019   Procedure: RIGHT TOTAL HIP ARTHROPLASTY ANTERIOR APPROACH;  Surgeon: Arnie Lao, MD;  Location: [facility];  Service: Orthopedics;  Laterality: Right;   WISDOM TOOTH EXTRACTION      No Known Allergies  Outpatient Encounter Medications as of 07/25/2023  Medication Sig   buPROPion (WELLBUTRIN XL) 150 MG 24 hr tablet Take 150 mg by mouth daily.   calcium carbonate (OSCAL) 1500 (600 Ca) MG TABS tablet Take 600 mg of elemental calcium by mouth daily with breakfast.   cholecalciferol (VITAMIN D3) 25 MCG (1000 UNIT) tablet Take 2,000 Units by mouth daily.   melatonin 1 MG TABS tablet Take 3 mg by mouth at bedtime.   memantine (NAMENDA) 5 MG tablet Take 10 mg by mouth 2 (two) times daily.   mirtazapine (REMERON) 7.5 MG tablet Take 15 mg by mouth at bedtime.   rivastigmine (EXELON) 9.5 mg/24hr Place 9.5 mg onto the skin daily.   sertraline  (ZOLOFT ) 50 MG tablet Take 25 mg by mouth at bedtime.   [DISCONTINUED] metFORMIN  (GLUCOPHAGE ) 500 MG tablet Take 0.5 tablets (250 mg total) by mouth 2 (two) times daily with a meal. (Patient taking differently: Take 500 mg by mouth 2 (two) times daily with a meal.)   metFORMIN  (GLUCOPHAGE ) 500 MG tablet Take 1 tablet (500 mg total) by mouth 2 (two) times daily with a meal.   [DISCONTINUED] BOOSTRIX 5-2.5-18.5 LF-MCG/0.5 injection Inject 0.5 mLs into the muscle once. (Patient not taking: Reported on 07/20/2023)   [DISCONTINUED] SPIKEVAX syringe Inject 0.5 mLs into the muscle once. (Patient not taking: Reported on 07/20/2023)   No facility-administered encounter medications on file as of 07/25/2023.    Review of Systems:  Review of Systems  Constitutional:  Negative for activity change, appetite change, chills, diaphoresis, fatigue, fever and  unexpected weight change.  HENT:  Negative for congestion.   Respiratory:  Negative for cough, shortness of breath and wheezing.   Cardiovascular:  Negative for chest pain, palpitations and leg swelling.  Gastrointestinal:  Negative for abdominal distention, abdominal pain, constipation and diarrhea.  Genitourinary:  Negative for difficulty urinating and dysuria.  Musculoskeletal:  Negative for arthralgias, back pain, gait problem, joint swelling and myalgias.  Neurological:  Negative for dizziness, tremors, seizures, syncope, facial asymmetry, speech difficulty, weakness, light-headedness, numbness and headaches.  Psychiatric/Behavioral:  Positive for sleep disturbance. Negative for agitation, behavioral problems and confusion.        Memory loss    Health Maintenance  Topic Date Due   Zoster Vaccines- Shingrix (2 of 2) 04/25/2023   Medicare Annual Wellness (AWV)  08/30/2023   Diabetic kidney evaluation - Urine ACR  10/25/2023 (Originally 12/10/1958)   COVID-19 Vaccine (5 - 2024-25 season) 12/16/2023 (Originally 02/01/2023)   INFLUENZA VACCINE  09/16/2023   OPHTHALMOLOGY EXAM  10/25/2023   FOOT EXAM  12/20/2023   HEMOGLOBIN A1C  01/21/2024   Diabetic kidney evaluation - eGFR measurement  07/21/2024   DTaP/Tdap/Td (2 - Td or Tdap) 08/31/2032   Pneumonia Vaccine 14+ Years old  Completed   DEXA SCAN  Completed   HPV VACCINES  Aged Out   Meningococcal B Vaccine  Aged Out    Physical Exam: Vitals:   07/21/23 1637  BP: 124/70  Pulse: (!) 106  Temp: (!) 97.3 F (36.3 C)  SpO2: 97%  Weight: 161 lb 6.4 oz (73.2 kg)  Height: 5\' 5"  (1.651 m)   Body mass index is 26.86 kg/m. Physical Exam Constitutional:      General: She is not in acute distress.    Appearance: She is not diaphoretic.  HENT:     Head: Normocephalic and atraumatic.     Right Ear: Tympanic membrane normal.     Left Ear: Tympanic membrane normal.     Nose: Nose normal. No congestion.     Mouth/Throat:      Mouth: Mucous membranes are moist.     Pharynx: Oropharynx is clear.  Eyes:     Conjunctiva/sclera: Conjunctivae normal.     Pupils: Pupils are equal, round, and reactive to light.  Neck:     Vascular: No JVD.  Cardiovascular:     Rate and Rhythm: Normal rate and regular rhythm.     Pulses:          Dorsalis pedis pulses are 2+ on the right side and 2+ on the left side.     Heart sounds: No murmur heard. Pulmonary:     Effort: Pulmonary effort is normal. No respiratory distress.     Breath sounds: Normal breath sounds. No wheezing.  Abdominal:     General: Bowel sounds are normal. There is no distension.     Palpations: Abdomen is soft.     Tenderness: There is no abdominal tenderness.  Musculoskeletal:     Cervical back: No rigidity or tenderness.     Right lower leg: No edema.     Left lower leg: No edema.  Feet:     Comments: Flat plantar surface Lymphadenopathy:     Cervical: No cervical adenopathy.  Skin:    General: Skin is warm and dry.  Neurological:     Mental Status: She is alert and oriented to person, place, and time. Mental status is at baseline.  Psychiatric:        Mood and Affect: Mood normal.     Labs reviewed: Basic Metabolic Panel: Recent Labs    04/14/23 0000 07/22/23 0000  NA 137 138  K 4.1 3.9  CL 103 103  CO2 21 22  BUN 11 11  CREATININE 0.7 0.8  CALCIUM 8.8 8.9  TSH  --  1.79   Liver Function Tests: Recent Labs    07/22/23 0000  AST 39*  ALT 59*  ALKPHOS 65  ALBUMIN 3.8   No results for input(s): "LIPASE", "AMYLASE" in the last 8760 hours. No results for input(s): "AMMONIA" in the last 8760 hours. CBC: Recent Labs    12/21/22 0000 07/22/23 0000  WBC 8.3 9.7  HGB 13.1 12.9  HCT 38 38  PLT 156 170   Lipid Panel: No results for input(s): "CHOL", "HDL", "LDLCALC", "TRIG", "CHOLHDL", "LDLDIRECT" in the last 8760 hours. Lab Results  Component Value Date   HGBA1C 7.6 07/22/2023    Procedures since last visit: No results  found.  Assessment/Plan  Type 2 Diabetes Mellitus A1c increased to 7.6, indicating suboptimal control. Fasting glucose is 223 mg/dL. Metformin  increase did not improve glucose levels. Dietary choices at assisted living may contribute. A1c <8 acceptable at 82 years, but upward trend concerning. - Continue metformin   - Encourage dietary modifications to reduce carbohydrate and sugar intake. - Encourage participation in exercise classes or walking. - Repeat A1c in three months. - Perform spot glucose checks twice a week and review results after two weeks.  Elevated Liver Enzymes ALT 59, AST 39, slightly elevated. No symptoms of liver disease. Possible transient elevation. No excessive Tylenol  use reported. - Repeat liver function tests in three months with A1c.  Dementia On Namenda 10 mg since April for memory. No side effects reported. - Continue Namenda 10 mg  Insomnia Occasional insomnia. Currently on Remeron 15 mg and melatonin 1 mg. Some nights without sleep. - Increase melatonin to 3 mg at bedtime.  Shingles Vaccination Reaction Reaction to shingles vaccine with nausea and vomiting. Due for booster. Vaccination recommended due to risk of complications. - Administer shingles booster in the morning. - Provide scheduled Zofran  for 48 hours post-vaccination to reduce nausea. - Ensure adequate hydration with Gatorade or Pedialyte post-vaccination.  Vitamin D Insufficiency Vitamin D level 31.7 ng/mL, low normal. Currently on 1000 IU daily. Increase recommended for bone and overall health. - Increase vitamin D to 2000 IU daily.          Labs/tests ordered:  * No order type specified * BMP A1C urine micro Lipid prior to next apt Next appt:  3 months   Total time :  time greater than 50% of total time spent doing pt counseling and coordination of care

## 2023-07-29 ENCOUNTER — Ambulatory Visit: Payer: Medicare PPO | Admitting: Physician Assistant

## 2023-08-17 DIAGNOSIS — R41841 Cognitive communication deficit: Secondary | ICD-10-CM | POA: Diagnosis not present

## 2023-08-17 DIAGNOSIS — G3184 Mild cognitive impairment, so stated: Secondary | ICD-10-CM | POA: Diagnosis not present

## 2023-08-20 DIAGNOSIS — G3184 Mild cognitive impairment, so stated: Secondary | ICD-10-CM | POA: Diagnosis not present

## 2023-08-20 DIAGNOSIS — R41841 Cognitive communication deficit: Secondary | ICD-10-CM | POA: Diagnosis not present

## 2023-08-22 DIAGNOSIS — R41841 Cognitive communication deficit: Secondary | ICD-10-CM | POA: Diagnosis not present

## 2023-08-22 DIAGNOSIS — G3184 Mild cognitive impairment, so stated: Secondary | ICD-10-CM | POA: Diagnosis not present

## 2023-08-24 DIAGNOSIS — G3184 Mild cognitive impairment, so stated: Secondary | ICD-10-CM | POA: Diagnosis not present

## 2023-08-24 DIAGNOSIS — R41841 Cognitive communication deficit: Secondary | ICD-10-CM | POA: Diagnosis not present

## 2023-08-29 DIAGNOSIS — G3184 Mild cognitive impairment, so stated: Secondary | ICD-10-CM | POA: Diagnosis not present

## 2023-08-29 DIAGNOSIS — R41841 Cognitive communication deficit: Secondary | ICD-10-CM | POA: Diagnosis not present

## 2023-09-03 DIAGNOSIS — G3184 Mild cognitive impairment, so stated: Secondary | ICD-10-CM | POA: Diagnosis not present

## 2023-09-03 DIAGNOSIS — R41841 Cognitive communication deficit: Secondary | ICD-10-CM | POA: Diagnosis not present

## 2023-09-05 DIAGNOSIS — R41841 Cognitive communication deficit: Secondary | ICD-10-CM | POA: Diagnosis not present

## 2023-09-05 DIAGNOSIS — G3184 Mild cognitive impairment, so stated: Secondary | ICD-10-CM | POA: Diagnosis not present

## 2023-09-09 DIAGNOSIS — G3184 Mild cognitive impairment, so stated: Secondary | ICD-10-CM | POA: Diagnosis not present

## 2023-09-09 DIAGNOSIS — R41841 Cognitive communication deficit: Secondary | ICD-10-CM | POA: Diagnosis not present

## 2023-09-13 DIAGNOSIS — R41841 Cognitive communication deficit: Secondary | ICD-10-CM | POA: Diagnosis not present

## 2023-09-15 DIAGNOSIS — R41841 Cognitive communication deficit: Secondary | ICD-10-CM | POA: Diagnosis not present

## 2023-09-20 DIAGNOSIS — R41841 Cognitive communication deficit: Secondary | ICD-10-CM | POA: Diagnosis not present

## 2023-09-23 DIAGNOSIS — R41841 Cognitive communication deficit: Secondary | ICD-10-CM | POA: Diagnosis not present

## 2023-09-27 DIAGNOSIS — G3184 Mild cognitive impairment, so stated: Secondary | ICD-10-CM | POA: Diagnosis not present

## 2023-09-27 DIAGNOSIS — R41841 Cognitive communication deficit: Secondary | ICD-10-CM | POA: Diagnosis not present

## 2023-09-30 DIAGNOSIS — R41841 Cognitive communication deficit: Secondary | ICD-10-CM | POA: Diagnosis not present

## 2023-09-30 DIAGNOSIS — G3184 Mild cognitive impairment, so stated: Secondary | ICD-10-CM | POA: Diagnosis not present

## 2023-10-24 DIAGNOSIS — E785 Hyperlipidemia, unspecified: Secondary | ICD-10-CM | POA: Diagnosis not present

## 2023-10-24 DIAGNOSIS — E119 Type 2 diabetes mellitus without complications: Secondary | ICD-10-CM | POA: Diagnosis not present

## 2023-10-24 LAB — COMPREHENSIVE METABOLIC PANEL WITH GFR
Albumin: 3.7 (ref 3.5–5.0)
Calcium: 9.3 (ref 8.7–10.7)
Globulin: 2.6
eGFR: 88

## 2023-10-24 LAB — HEMOGLOBIN A1C: Hemoglobin A1C: 7.8

## 2023-10-24 LAB — HEPATIC FUNCTION PANEL
ALT: 63 U/L — AB (ref 7–35)
AST: 46 — AB (ref 13–35)
Alkaline Phosphatase: 65 (ref 25–125)

## 2023-10-24 LAB — BASIC METABOLIC PANEL WITH GFR
BUN: 11 (ref 4–21)
CO2: 22 (ref 13–22)
Chloride: 103 (ref 99–108)
Creatinine: 0.7 (ref 0.5–1.1)
Glucose: 204
Potassium: 3.7 meq/L (ref 3.5–5.1)
Sodium: 137 (ref 137–147)

## 2023-10-24 LAB — LIPID PANEL
Cholesterol: 197 (ref 0–200)
HDL: 40 (ref 35–70)
LDL Cholesterol: 123
LDl/HDL Ratio: 4.9
Triglycerides: 239 — AB (ref 40–160)

## 2023-10-25 ENCOUNTER — Non-Acute Institutional Stay: Admitting: Internal Medicine

## 2023-10-25 ENCOUNTER — Encounter: Payer: Self-pay | Admitting: Internal Medicine

## 2023-10-25 VITALS — BP 110/68 | HR 95 | Temp 97.9°F | Ht 65.0 in | Wt 159.2 lb

## 2023-10-25 DIAGNOSIS — F5101 Primary insomnia: Secondary | ICD-10-CM | POA: Diagnosis not present

## 2023-10-25 DIAGNOSIS — E118 Type 2 diabetes mellitus with unspecified complications: Secondary | ICD-10-CM

## 2023-10-25 DIAGNOSIS — E782 Mixed hyperlipidemia: Secondary | ICD-10-CM | POA: Diagnosis not present

## 2023-10-25 DIAGNOSIS — R7989 Other specified abnormal findings of blood chemistry: Secondary | ICD-10-CM

## 2023-10-25 DIAGNOSIS — F0390 Unspecified dementia without behavioral disturbance: Secondary | ICD-10-CM

## 2023-10-25 NOTE — Progress Notes (Signed)
 Location:  [facility] features editor of Service:  Clinic (12)  Provider:   Code Status:  Goals of Care:     04/19/2023    9:38 AM  Advanced Directives  Does Patient Have a Medical Advance Directive? Yes  Type of Estate agent of [place];Living will  Does patient want to make changes to medical advance directive? No - Patient declined  Copy of Healthcare Power of Attorney in Chart? Yes - validated most recent copy scanned in chart (See row information)     Chief Complaint  Patient presents with   Medical Management of Chronic Issues    3 month follow up with labs. Patient intends to get her covid and flu vaccines when they have the clinic here.(Postponed)    HPI: Patient is a 83 y.o. female seen today for medical management of chronic diseases.   Patient lives in ALF [facility]   She has h/o HLD Did not tolerate Statin H/o Breast Cancer s/p Right Mastectomy S/P Right hip Arthroplasty  in 9/21 H/o Intraventricular Hemorrhage in 10/16 after a fall Diabetes Type 2  Depression Insomnia  Dementia Alzheimer's dementia last MMSE 26 out of 30 08/2022 Insomnia  Discussed the use of AI scribe software for clinical note transcription with the patient, who gave verbal consent to proceed.  History of Present Illness   Jodi Summers is an 83 year old female with Alzheimer's dementia and diabetes who presents for a routine follow-up visit. She is accompanied by her daughter, who is her primary caregiver.  She has Alzheimer's dementia and is on Namenda and the Exelon patch. She resides in an assisted living facility and is thriving. Her routine includes showering twice a week in the afternoons or evenings. No recent falls or accidents, and she tolerates her medications well.  She has type 2 diabetes with a stable A1c of 7.6 and is on metformin . Her weight is stable. She does not follow a regular exercise routine but enjoys reading and doing  crosswords.  Her lipid panel shows elevated cholesterol and triglycerides. Liver enzymes are slightly elevated. She does not consume alcohol.  A bone density scan shows a T-score of -2.7 at the radius. She has a history of hip surgery and severe arthritis, which precludes hip and spine bone density measurements. She is not on osteoporosis treatment.  Her vitamin D  level is low at 31.7, and she is on vitamin D  supplementation.      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   History of COVID-19 2024   History of hysterectomy 03/18/2021   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   Hyperlipidemia 11/30/2018   Hyponatremia 09/01/2021   Knee pain 02/22/2017   Lactic acidosis 09/01/2021   Leukocytosis 09/01/2021   Lichen sclerosus 03/18/2021   Low back pain 02/22/2017   Malignant neoplasm of upper-outer quadrant of right female breast 1999   Mild dementia, concerns for Alzheimer's disease 06/22/2022   Orthostatic hypotension 09/01/2021   Pain in toe 12/06/2019   Personal history of chemotherapy    Personal history of radiation therapy    Right wrist sprain 09/01/2021   Syncope  and collapse 09/01/2021   Unilateral primary osteoarthritis, 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    Past Surgical History:  Procedure Laterality Date   ABDOMINAL HYSTERECTOMY  1998   fibroid   BREAST LUMPECTOMY Right 1999   CHOLECYSTECTOMY     JOINT REPLACEMENT     L knee replacement   TONSILLECTOMY     TOTAL HIP ARTHROPLASTY Right 10/26/2019   Procedure: RIGHT TOTAL HIP ARTHROPLASTY ANTERIOR APPROACH;  Surgeon: Vernetta Lonni GRADE, MD;  Location: [facility];  Service:  Orthopedics;  Laterality: Right;   WISDOM TOOTH EXTRACTION      No Known Allergies  Outpatient Encounter Medications as of 10/25/2023  Medication Sig   buPROPion (WELLBUTRIN XL) 150 MG 24 hr tablet Take 150 mg by mouth daily.   calcium carbonate (OSCAL) 1500 (600 Ca) MG TABS tablet Take 600 mg of elemental calcium by mouth daily with breakfast.   cholecalciferol (VITAMIN D3) 25 MCG (1000 UNIT) tablet Take 2,000 Units by mouth daily.   melatonin 3 MG TABS tablet Take 3 mg by mouth at bedtime.   memantine (NAMENDA) 10 MG tablet Take 1 tablet by mouth 2 (two) times daily.   metFORMIN  (GLUCOPHAGE ) 500 MG tablet Take 1 tablet (500 mg total) by mouth 2 (two) times daily with a meal.   mirtazapine (REMERON) 15 MG tablet Take 1 tablet by mouth at bedtime.   rivastigmine (EXELON) 9.5 mg/24hr Place 9.5 mg onto the skin daily.   sertraline  (ZOLOFT ) 25 MG tablet Take 1 tablet by mouth at bedtime.   [DISCONTINUED] sodium chloride  1 g tablet Take 1 g by mouth 3 (three) times daily.   melatonin 1 MG TABS tablet Take 3 mg by mouth at bedtime. (Patient not taking: Reported on 10/25/2023)   memantine (NAMENDA) 5 MG tablet Take 10 mg by mouth 2 (two) times daily. (Patient not taking: Reported on 10/25/2023)   mirtazapine (REMERON) 7.5 MG tablet Take 15 mg by mouth at bedtime. (Patient not taking: Reported on 10/25/2023)   sertraline  (ZOLOFT ) 50 MG tablet Take 25 mg by mouth at bedtime. (Patient not taking: Reported on 10/25/2023)   No facility-administered encounter medications on file as of 10/25/2023.    Review of Systems:  Review of Systems  Constitutional:  Negative for activity change and appetite change.  HENT: Negative.    Respiratory:  Negative for cough and shortness of breath.   Cardiovascular:  Negative for leg swelling.  Gastrointestinal:  Negative for constipation.  Genitourinary: Negative.   Musculoskeletal:  Negative for arthralgias, gait problem and myalgias.  Skin: Negative.   Neurological:   Negative for dizziness and weakness.  Psychiatric/Behavioral:  Positive for confusion. Negative for dysphoric mood and sleep disturbance.     Health Maintenance  Topic Date Due   Zoster Vaccines- Shingrix (2 of 2) 04/25/2023   Medicare Annual Wellness (AWV)  08/30/2023   OPHTHALMOLOGY EXAM  10/25/2023   Diabetic kidney evaluation - Urine ACR  10/25/2023 (Originally 12/10/1958)   Influenza Vaccine  01/13/2024 (Originally 09/16/2023)   COVID-19 Vaccine (5 - 2025-26 season) 01/13/2024 (Originally 10/17/2023)   FOOT EXAM  12/20/2023   HEMOGLOBIN A1C  04/22/2024   Diabetic kidney evaluation - eGFR measurement  10/23/2024   DTaP/Tdap/Td (2 - Td or Tdap) 08/31/2032   Pneumococcal Vaccine: 50+ Years  Completed   DEXA SCAN  Completed   HPV VACCINES  Aged Out   Meningococcal B Vaccine  Aged Out    Physical Exam: Vitals:   10/25/23 1118  BP: 110/68  Pulse: 95  Temp: 97.9 F (36.6 C)  SpO2: 95%  Weight: 159 lb 3.2 oz (72.2 kg)  Height: 5' 5 (1.651 m)   Body mass index is 26.49 kg/m. Physical Exam Vitals reviewed.  Constitutional:      Appearance: Normal appearance.  HENT:     Head: Normocephalic.     Nose: Nose normal.     Mouth/Throat:     Mouth: Mucous membranes are moist.     Pharynx: Oropharynx is clear.  Eyes:     Pupils: Pupils are equal, round, and reactive to light.  Cardiovascular:     Rate and Rhythm: Normal rate and regular rhythm.     Pulses: Normal pulses.     Heart sounds: Normal heart sounds. No murmur heard. Pulmonary:     Effort: Pulmonary effort is normal.     Breath sounds: Normal breath sounds.  Abdominal:     General: Abdomen is flat. Bowel sounds are normal.     Palpations: Abdomen is soft.  Musculoskeletal:        General: No swelling.     Cervical back: Neck supple.  Skin:    General: Skin is warm.  Neurological:     General: No focal deficit present.     Mental Status: She is alert.  Psychiatric:        Mood and Affect: Mood normal.         Thought Content: Thought content normal.     Labs reviewed: Basic Metabolic Panel: Recent Labs    04/14/23 0000 07/22/23 0000 10/24/23 0000  NA 137 138 137  K 4.1 3.9 3.7  CL 103 103 103  CO2 21 22 22   BUN 11 11 11   CREATININE 0.7 0.8 0.7  CALCIUM 8.8 8.9 9.3  TSH  --  1.79  --    Liver Function Tests: Recent Labs    07/22/23 0000 10/24/23 0000  AST 39* 46*  ALT 59* 63*  ALKPHOS 65 65  ALBUMIN 3.8 3.7   No results for input(s): LIPASE, AMYLASE in the last 8760 hours. No results for input(s): AMMONIA in the last 8760 hours. CBC: Recent Labs    12/21/22 0000 07/22/23 0000  WBC 8.3 9.7  HGB 13.1 12.9  HCT 38 38  PLT 156 170   Lipid Panel: Recent Labs    10/24/23 0000  CHOL 197  HDL 40  LDLCALC 123  TRIG 760*   Lab Results  Component Value Date   HGBA1C 7.8 10/24/2023    Procedures since last visit: No results found.  Assessment/Plan Assessment and Plan    Alzheimer's dementia Managed with Namenda and Exelon patch, reducing paranoia and maintaining comfort. No significant memory improvement expected, goal to slow progression and maintain stability. - Continue Namenda and Exelon patch. - Maintain current living situation in assisted living as long as possible.  Type 2 diabetes mellitus Well-controlled with A1c of 7.6, stable weight, tolerating metformin . - Continue metformin . - Monitor A1c levels.  Osteoporosis Confirmed with T-score of -2.7 at the radius. Hip and spine not assessed due to arthritis and previous surgery. Status borderline, further assessment in 2026. Personal training for weight-bearing exercises recommended. - Repeat bone density scan in 2026. - Consider Fosamax if bone density declines further. - Explore personal training for weight-bearing exercises.  Hyperlipidemia Elevated cholesterol and triglycerides. Statin therapy deferred due to elevated liver enzymes. Plan to monitor liver function before initiating statin  therapy. - Monitor liver enzymes. - Consider statin therapy if liver enzymes stabilize.  Elevated  liver enzymes No alcohol use. Plan to monitor liver function and consider imaging if enzymes continue to rise. Statin therapy deferred until liver enzymes stable. - Repeat liver function tests in 3 months. - Consider liver ultrasound if liver enzymes remain elevated.  Vitamin D  deficiency Vitamin D  level low at 31.7. - Increase vitamin D  supplementation to 2000 IU daily.         Labs/tests ordered:  LFTS and Vitamin d  A1C ordered  Next appt:  Visit date not found

## 2023-10-27 DIAGNOSIS — E1110 Type 2 diabetes mellitus with ketoacidosis without coma: Secondary | ICD-10-CM | POA: Diagnosis not present

## 2023-11-15 NOTE — Progress Notes (Signed)
 Jodi Summers                                          MRN: [id number]   11/15/2023   The [facility] Quality Team Specialist reviewed this patient medical record for the purposes of chart review for care gap closure. The following were reviewed: chart review for care gap closure-kidney health evaluation for diabetes:eGFR  and uACR.    [facility] Quality Team

## 2023-12-12 NOTE — Progress Notes (Addendum)
 Assessment/Plan:   Dementia likely due to Alzheimer's disease   Jodi Summers is a very pleasant 83 y.o. 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 Mild memory decline is noted with an MMSE of 22/30.  Patient is currently on rivastigmine 9.5 mg patch daily as per PCP and memantine 10 mg twice daily.  Patient is able to participate on ADLs at ALF .  Mood is  good. Discussed with patient and daughter increasing rivastigmine patch to 13.3 mg TD every day in an effort to slow down memory decline, both agree to proceed.     Follow up in  6 months. Continue rivastigmine patch, increase to 13.3 mg  TD daily  and memantine continue 10 mg twice daily, side effects discussed  Recommend good control of her cardiovascular risk factors Continue to control mood as per PCP      Subjective:    This patient is accompanied in the office by her daughter who supplements the history.  Previous records as well as any outside records available were reviewed prior to todays visit. Patient was last seen on 01/25/2023    Any changes in memory since last visit? Some days better than others.  Sometimes she cannot remember grandchildren's names, appointments or recent conversations. LTM is good.  Enjoys doing crossword puzzles and reading.  She enjoys activities at the assisted living facility. repeats oneself?  Endorsed Disoriented when walking into a room? Denies   Leaving objects?  May misplace things but not in unusual places. She now has a landine because she was removing the chargers   Wandering behavior?  denies   Any personality changes since  last visit?  Denies.   Any worsening depression?:  Denies.   Hallucinations or paranoia?  Denies.   Seizures? denies    Any sleep changes? Sleeps well denies vivid dreams, REM behavior or sleepwalking   Sleep apnea?   Denies.   Any hygiene concerns?  Before, she needs reminder. Independent of bathing and dressing?  Endorsed  Does the patient needs help with medications? Facility  is in charge   Who is in charge of the finances?  Daughter and Son is in charge     Any changes in appetite?  Denies.     Patient have trouble swallowing? Denies.   Does the patient cook? No Any headaches?   denies   Any vision changes?denies Chronic back pain  denies   Ambulates with difficulty? Denies.    Recent falls or head injuries? Denies.     Unilateral weakness, numbness or tingling? denies   Any tremors?  Denies    Any anosmia?  Denies   Any incontinence of urine?  Denies  Any bowel dysfunction?   Denies      Patient lives at ALF at [facility]   Does the patient drive? No longer drives   Initial Visit 03/04/21 The patient is seen in neurologic consultation at the request of Charlanne Fredia CROME, MD for the evaluation of memory. The patient is accompanied by her daughter Rosina who supplements the history. This is a 83 y.o. year old RH  female who has had memory issues for about 3 years.  The patient has moved from [facility]  during the pandemic to be near her daughter, and moved to [facility] independent living, which is reported to be an exhausting experience, as she was presented with so many things to do and to many people introducing themselves, which appeared to be overwhelming to her .  Her daughter reports that this took a toll on her emotional and mental readiness.  Due to a noticeable cognitive decline, her daughter arranged Neuropsych evaluation by Juliene Debby Belts, PhD from neuropsychology at [facility]) in 05/27/2020 yielding an normal examination.  Over the last 6 to 8 months,  her daughter states that short-term memory is worse, sometimes not remembering that she asked the same question, or told the same story.  She has not been sleeping well either, tried low dose of melatonin without significant improvement.  Denies any vivid dreams, REM behavior, or sleepwalking.  She denies any hallucinations, but she uses a cane just in case I need to fight somebody that tries to get in the apartment .  She denies any confusion when coming to her room, or leaving objects in unusual places.  There is some degree of depression due to all this changes.  She enjoys doing crossword puzzles and word finding.  She still trying to adjust to independent living facility.  In October 2022 she had COVID, and was in isolation for about 10 days, which in turn appeared to be therapeutic, as she did not need to interact with other residents.  There are no hygiene concerns, she is independent of bathing and dressing.  Her medications are in a pillbox, occasionally she may forget the dose.  Daughter is in charge of the finances.  Appetite is fairly good, denies trouble swallowing.  She denies any recent falls.  She sustained a head injury in October 2016 after falling in the parking lot of her [employer], with loss of consciousness but with no reported retroactive amnesia, with intraventricular hemorrhage needing hospitalization.  She had posttraumatic amnestic period of less than 4 hours.  Denies headaches, double vision, dizziness, focal numbness or tingling, unilateral weakness or tremors or anosmia. No history of seizures. Denies urine incontinence, retention, constipation or diarrhea.  Denies OSA, ETOH or Tobacco. Family History strong for Alzheimer's disease in sister and mother.  She is a retired runner, broadcasting/film/video    CT scan performed in July 2016 reportedly revealed a linear hyperdensity within the posterior aspect of the lateral ventricles consistent with a small amount of intraventricular hemorrhage. There was also  minimal blood products layering in the posterior horns of the lateral ventricles. Moreover, there was a 2 mm subdural hematoma lateral to the right temporal lobe. It was purportedly not hyperdense and therefore could be subacute. Focal hyperdensity in the right sylvian fissure  which possibly represent a minimal amount of traumatic subarachnoid hemorrhage.  A brain MRI scan performed in July 2016 reportedly revealed global parenchymal brain volume loss mildly advanced for age and mild white matter changes compatible with chronic small vessel ischemic disease.   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 An additional impairment was exhibited across cognitive flexibility, while relative weaknesses were exhibited across processing speed and semantic fluency. Variability was seen across confrontation naming. Relative to her previous evaluation in April 2023, mild decline was exhibited across aspects of learning  and memory. Mild declines were also exhibited across processing speed, complex attention, cognitive flexibility, sentence repetition, and semantic fluency. Areas of decline do appear somewhat more pronounced when compared to testing performed in April 2022. I continue to have primary concerns surrounding an underlying neurodegenerative illness, namely Alzheimer's disease. Across memory testing, retention rates ranged from 0% to 50% and she did exhibit some weaker performances across recognition trials. This continues to suggest evidence for rapid forgetting and an evolving storage impairment, both of which are the hallmark characteristics of this illness. These testing patterns continue to align with her and her daughter's  description of worsening repetition and short-term memory loss in her day-to-day life. Additional weakness surrounding semantic fluency, confrontation naming, and cognitive flexibility do follow fairly traditional disease progression, further escalating concerns.   PREVIOUS MEDICATIONS:   CURRENT MEDICATIONS:  Outpatient Encounter Medications as of 12/13/2023  Medication Sig   buPROPion (WELLBUTRIN XL) 150 MG 24 hr tablet Take 150 mg by mouth daily.   calcium carbonate (OSCAL) 1500 (600 Ca) MG TABS tablet Take 600 mg of elemental calcium by mouth daily with breakfast.   cholecalciferol (VITAMIN D3) 25 MCG (1000 UNIT) tablet Take 2,000 Units by mouth daily.   melatonin 3 MG TABS tablet Take 3 mg by mouth at bedtime.   memantine (NAMENDA) 10 MG tablet Take 1 tablet by mouth 2 (two) times daily.   metFORMIN  (GLUCOPHAGE ) 500 MG tablet Take 1 tablet (500 mg total) by mouth 2 (two) times daily with a meal.   mirtazapine (REMERON) 15 MG tablet Take 1 tablet by mouth at bedtime.   mirtazapine (REMERON) 7.5 MG tablet Take 15 mg by mouth at bedtime.   rivastigmine (EXELON) 13.3 MG/24HR Place 13.3 mg onto the skin daily.   sertraline  (ZOLOFT ) 25 MG tablet Take 1 tablet by mouth at bedtime.   melatonin 1 MG TABS tablet Take 3 mg by mouth at bedtime. (Patient not taking: Reported on 10/25/2023)   sertraline  (ZOLOFT ) 50 MG tablet Take 25 mg by mouth at bedtime. (Patient not taking: Reported on 12/13/2023)   [DISCONTINUED] memantine (NAMENDA) 5 MG tablet Take 10 mg by mouth 2 (two) times daily. (Patient not taking: Reported on 10/25/2023)   No facility-administered encounter medications on file as of 12/13/2023.       12/13/2023    5:00 PM 08/30/2022    3:33 PM 07/20/2021    1:54 PM  MMSE - Mini Mental State Exam  Orientation to time 0 1 4  Orientation to Place 4 5 4   Registration 3 3 3   Attention/ Calculation 5 5 1   Recall 2 3 3   Language- name 2 objects 2 2 2   Language- repeat 1 1 1   Language-  follow 3 step command 3 3 3   Language- read & follow direction 1 1 1   Write a sentence 1 1 1   Copy design 0 1 1  Total score 22 26 24       03/05/2021    6:00 AM  Montreal Cognitive Assessment   Visuospatial/ Executive (0/5) 5  Naming (0/3) 2  Attention: Read list of digits (0/2) 2  Attention: Read list of letters (0/1) 1  Attention: Serial 7 subtraction starting at 100 (0/3) 3  Language: Repeat phrase (0/2) 1  Language : Fluency (0/1) 1  Abstraction (0/2) 1  Delayed Recall (0/5) 3  Orientation (0/6) 6  Total 25  Adjusted Score (based on education) 25    Objective:     PHYSICAL EXAMINATION:  VITALS:   Vitals:   12/13/23 1455  BP: 133/74  Pulse: 99  Resp: 20  SpO2: 97%  Weight: 160 lb (72.6 kg)    GEN:  The patient appears stated age and is in NAD. HEENT:  Normocephalic, atraumatic.   Neurological examination:  General: NAD, well-groomed, appears stated age. Orientation: The patient is alert. Oriented to person, place and not to date Cranial nerves: There is good facial symmetry.The speech is fluent and clear. No aphasia or dysarthria. Fund of knowledge is reduced. Recent and remote memory are impaired. Attention and concentration are reduced. Able to name objects and repeat phrases.  Hearing is intact to conversational tone.   Sensation: Sensation is intact to light touch throughout Motor: Strength is at least antigravity x4. DTR's 2/4 in UE/LE     Movement examination: Tone: There is normal tone in the UE/LE Abnormal movements:  no tremor.  No myoclonus.  No asterixis.   Coordination:  There is no decremation with RAM's. Normal finger to nose  Gait and Station: The patient has some difficulty arising out of a deep-seated chair without the use of the hands. The patient's stride length is good.  Gait is cautious and narrow.    Thank you for allowing us  the opportunity to participate in the care of this nice patient. Please do not hesitate to contact us  for  any questions or concerns.   Total time spent on today's visit was 32 minutes dedicated to this patient today, preparing to see patient, examining the patient, ordering tests and/or medications and counseling the patient, documenting clinical information in the EHR or other health record, independently interpreting results and communicating results to the patient/family, discussing treatment and goals, answering patient's questions and coordinating care.  Cc:  Charlanne Fredia CROME, MD  Camie Sevin 12/19/2023 8:01 AM

## 2023-12-13 ENCOUNTER — Encounter: Payer: Self-pay | Admitting: Physician Assistant

## 2023-12-13 ENCOUNTER — Ambulatory Visit: Admitting: Physician Assistant

## 2023-12-13 VITALS — BP 133/74 | HR 99 | Resp 20 | Wt 160.0 lb

## 2023-12-13 DIAGNOSIS — F02A0 Dementia in other diseases classified elsewhere, mild, without behavioral disturbance, psychotic disturbance, mood disturbance, and anxiety: Secondary | ICD-10-CM

## 2023-12-13 DIAGNOSIS — G309 Alzheimer's disease, unspecified: Secondary | ICD-10-CM | POA: Diagnosis not present

## 2023-12-13 NOTE — Patient Instructions (Signed)
 It was a pleasure to see you today at our office.   Recommendations:  Follow up in 6 moths  Continue rivastigmine increase to 13.3 mg patch .  Memantine  10 mg twice a day  Continue the other medicines as prescribed         RECOMMENDATIONS FOR ALL PATIENTS WITH MEMORY PROBLEMS: 1. Continue to exercise (Recommend 30 minutes of walking everyday, or 3 hours every week) 2. Increase social interactions - continue going to [place] and enjoy social gatherings with friends and family 3. Eat healthy, avoid fried foods and eat more fruits and vegetables 4. Maintain adequate blood pressure, blood sugar, and blood cholesterol level. Reducing the risk of stroke and cardiovascular disease also helps promoting better memory. 5. Avoid stressful situations. Live a simple life and avoid aggravations. Organize your time and prepare for the next day in anticipation. 6. Sleep well, avoid any interruptions of sleep and avoid any distractions in the bedroom that may interfere with adequate sleep quality 7. Avoid sugar, avoid sweets as there is a strong link between excessive sugar intake, diabetes, and cognitive impairment We discussed the Mediterranean diet, which has been shown to help patients reduce the risk of progressive memory disorders and reduces cardiovascular risk. This includes eating fish, eat fruits and green leafy vegetables, nuts like almonds and hazelnuts, walnuts, and also use olive oil. Avoid fast foods and fried foods as much as possible. Avoid sweets and sugar as sugar use has been linked to worsening of memory function.  There is always a concern of gradual progression of memory problems. If this is the case, then we may need to adjust level of care according to patient needs. Support, both to the patient and caregiver, should then be put into place.    The Alzheimer's Association is here all day, every day for people facing Alzheimer's disease through our free 24/7 Helpline: 626-300-8294. The  Helpline provides reliable information and support to all those who need assistance, such as individuals living with memory loss, Alzheimer's or other dementia, caregivers, health care professionals and the public.  Our highly trained and knowledgeable staff can help you with: Understanding memory loss, dementia and Alzheimer's  Medications and other treatment options  General information about aging and brain health  Skills to provide quality care and to find the best care from professionals  Legal, financial and living-arrangement decisions Our Helpline also features: Confidential care consultation provided by master's level clinicians who can help with decision-making support, crisis assistance and education on issues families face every day  Help in a caller's preferred language using our translation service that features more than 200 languages and dialects  Referrals to local community programs, services and ongoing support     FALL PRECAUTIONS: Be cautious when walking. Scan the area for obstacles that may increase the risk of trips and falls. When getting up in the mornings, sit up at the edge of the bed for a few minutes before getting out of bed. Consider elevating the bed at the head end to avoid drop of blood pressure when getting up. Walk always in a well-lit room (use night lights in the walls). Avoid area rugs or power cords from appliances in the middle of the walkways. Use a walker or a cane if necessary and consider physical therapy for balance exercise. Get your eyesight checked regularly.  FINANCIAL OVERSIGHT: Supervision, especially oversight when making financial decisions or transactions is also recommended.  HOME SAFETY: Consider the safety of the kitchen when  operating appliances like stoves, microwave oven, and blender. Consider having supervision and share cooking responsibilities until no longer able to participate in those. Accidents with firearms and other hazards in  the house should be identified and addressed as well.   ABILITY TO BE LEFT ALONE: If patient is unable to contact 911 operator, consider using LifeLine, or when the need is there, arrange for someone to stay with patients. Smoking is a fire hazard, consider supervision or cessation. Risk of wandering should be assessed by caregiver and if detected at any point, supervision and safe proof recommendations should be instituted.            Mediterranean Diet A Mediterranean diet refers to food and lifestyle choices that are based on the traditions of countries located on the [employer]. This way of eating has been shown to help prevent certain conditions and improve outcomes for people who have chronic diseases, like kidney disease and heart disease. What are tips for following this plan? Lifestyle  Cook and eat meals together with your family, when possible. Drink enough fluid to keep your urine clear or pale yellow. Be physically active every day. This includes: Aerobic exercise like running or swimming. Leisure activities like gardening, walking, or housework. Get 7-8 hours of sleep each night. If recommended by your health care provider, drink red wine in moderation. This means 1 glass a day for nonpregnant women and 2 glasses a day for men. A glass of wine equals 5 oz (150 mL). Reading food labels  Check the serving size of packaged foods. For foods such as rice and pasta, the serving size refers to the amount of cooked product, not dry. Check the total fat in packaged foods. Avoid foods that have saturated fat or trans fats. Check the ingredients list for added sugars, such as corn syrup. Shopping  At the grocery store, buy most of your food from the areas near the walls of the store. This includes: Fresh fruits and vegetables (produce). Grains, beans, nuts, and seeds. Some of these may be available in unpackaged forms or large amounts (in bulk). Fresh seafood. Poultry and  eggs. Low-fat dairy products. Buy whole ingredients instead of prepackaged foods. Buy fresh fruits and vegetables in-season from local farmers markets. Buy frozen fruits and vegetables in resealable bags. If you do not have access to quality fresh seafood, buy precooked frozen shrimp or canned fish, such as tuna, salmon, or sardines. Buy small amounts of raw or cooked vegetables, salads, or olives from the deli or salad bar at your store. Stock your pantry so you always have certain foods on hand, such as olive oil, canned tuna, canned tomatoes, rice, pasta, and beans. Cooking  Cook foods with extra-virgin olive oil instead of using butter or other vegetable oils. Have meat as a side dish, and have vegetables or grains as your main dish. This means having meat in small portions or adding small amounts of meat to foods like pasta or stew. Use beans or vegetables instead of meat in common dishes like chili or lasagna. Experiment with different cooking methods. Try roasting or broiling vegetables instead of steaming or sauteing them. Add frozen vegetables to soups, stews, pasta, or rice. Add nuts or seeds for added healthy fat at each meal. You can add these to yogurt, salads, or vegetable dishes. Marinate fish or vegetables using olive oil, lemon juice, garlic, and fresh herbs. Meal planning  Plan to eat 1 vegetarian meal one day each week. Try to work  up to 2 vegetarian meals, if possible. Eat seafood 2 or more times a week. Have healthy snacks readily available, such as: Vegetable sticks with hummus. Greek yogurt. Fruit and nut trail mix. Eat balanced meals throughout the week. This includes: Fruit: 2-3 servings a day Vegetables: 4-5 servings a day Low-fat dairy: 2 servings a day Fish, poultry, or lean meat: 1 serving a day Beans and legumes: 2 or more servings a week Nuts and seeds: 1-2 servings a day Whole grains: 6-8 servings a day Extra-virgin olive oil: 3-4 servings a day Limit  red meat and sweets to only a few servings a month What are my food choices? Mediterranean diet Recommended Grains: Whole-grain pasta. Brown rice. Bulgar wheat. Polenta. Couscous. Whole-wheat bread. [place] Madeira. Vegetables: Artichokes. Beets. Broccoli. Cabbage. Carrots. Eggplant. Green beans. Chard. Kale. Spinach. Onions. Leeks. Peas. Squash. Tomatoes. Peppers. Radishes. Fruits: Apples. Apricots. Avocado. Berries. Bananas. Cherries. Dates. Figs. Grapes. Lemons. Melon. Oranges. Peaches. Plums. Pomegranate. Meats and other protein foods: Beans. Almonds. Sunflower seeds. Pine nuts. Peanuts. Cod. Salmon. Scallops. Shrimp. Tuna. Tilapia. Clams. Oysters. Eggs. Dairy: Low-fat milk. Cheese. Greek yogurt. Beverages: Water . Red wine. Herbal tea. Fats and oils: Extra virgin olive oil. Avocado oil. Grape seed oil. Sweets and desserts: Greek yogurt with honey. Baked apples. Poached pears. Trail mix. Seasoning and other foods: Basil. Cilantro. Coriander. Cumin. Mint. Parsley. Sage. Rosemary. Tarragon. Garlic. Oregano. Thyme. Pepper. Balsalmic vinegar. Tahini. Hummus. Tomato sauce. Olives. Mushrooms. Limit these Grains: Prepackaged pasta or rice dishes. Prepackaged cereal with added sugar. Vegetables: Deep fried potatoes (french fries). Fruits: Fruit canned in syrup. Meats and other protein foods: Beef. Pork. Lamb. Poultry with skin. Hot dogs. [place]. Dairy: Ice cream. Sour cream. Whole milk. Beverages: Juice. Sugar-sweetened soft drinks. Beer. Liquor and spirits. Fats and oils: Butter. Canola oil. Vegetable oil. Beef fat (tallow). Lard. Sweets and desserts: Cookies. Cakes. Pies. Candy. Seasoning and other foods: Mayonnaise. Premade sauces and marinades. The items listed may not be a complete list. Talk with your dietitian about what dietary choices are right for you. Summary The Mediterranean diet includes both food and lifestyle choices. Eat a variety of fresh fruits and vegetables, beans, nuts,  seeds, and whole grains. Limit the amount of red meat and sweets that you eat. Talk with your health care provider about whether it is safe for you to drink red wine in moderation. This means 1 glass a day for nonpregnant women and 2 glasses a day for men. A glass of wine equals 5 oz (150 mL). This information is not intended to replace advice given to you by your health care provider. Make sure you discuss any questions you have with your health care provider.

## 2024-01-24 DIAGNOSIS — R7401 Elevation of levels of liver transaminase levels: Secondary | ICD-10-CM | POA: Diagnosis not present

## 2024-01-24 DIAGNOSIS — E876 Hypokalemia: Secondary | ICD-10-CM | POA: Diagnosis not present

## 2024-01-24 DIAGNOSIS — E559 Vitamin D deficiency, unspecified: Secondary | ICD-10-CM | POA: Diagnosis not present

## 2024-01-24 DIAGNOSIS — E118 Type 2 diabetes mellitus with unspecified complications: Secondary | ICD-10-CM | POA: Diagnosis not present

## 2024-01-24 LAB — HEPATIC FUNCTION PANEL
ALT: 72 U/L — AB (ref 7–35)
AST: 42 — AB (ref 13–35)
Alkaline Phosphatase: 72 (ref 25–125)
Bilirubin, Direct: 0.2 (ref 0.01–0.4)
Bilirubin, Total: 0.2

## 2024-01-24 LAB — VITAMIN D 25 HYDROXY (VIT D DEFICIENCY, FRACTURES): Vit D, 25-Hydroxy: 33.5

## 2024-01-24 LAB — HEMOGLOBIN A1C: Hemoglobin A1C: 8.2

## 2024-01-24 LAB — COMPREHENSIVE METABOLIC PANEL WITH GFR: Albumin: 3.9 (ref 3.5–5.0)

## 2024-01-30 ENCOUNTER — Encounter: Payer: Self-pay | Admitting: Adult Health

## 2024-01-30 ENCOUNTER — Non-Acute Institutional Stay: Payer: Self-pay | Admitting: Adult Health

## 2024-01-30 VITALS — BP 138/80 | HR 110 | Temp 98.0°F | Ht 65.0 in | Wt 161.8 lb

## 2024-01-30 DIAGNOSIS — Z7984 Long term (current) use of oral hypoglycemic drugs: Secondary | ICD-10-CM

## 2024-01-30 DIAGNOSIS — L853 Xerosis cutis: Secondary | ICD-10-CM | POA: Diagnosis not present

## 2024-01-30 DIAGNOSIS — E118 Type 2 diabetes mellitus with unspecified complications: Secondary | ICD-10-CM

## 2024-01-30 MED ORDER — EUCERIN ORIGINAL HEALING EX CREA: TOPICAL_CREAM | Freq: Every day | CUTANEOUS | Status: AC

## 2024-01-30 MED ORDER — METFORMIN HCL 500 MG PO TABS: 750.0000 mg | ORAL_TABLET | Freq: Two times a day (BID) | ORAL | Status: AC

## 2024-01-30 NOTE — Progress Notes (Signed)
 Location:  [facility]  POS: Clinic  Provider: Tawni America, ANP  Goals of Care:     12/13/2023    2:54 PM  Advanced Directives  Does Patient Have a Medical Advance Directive? Yes  Type of Advance Directive Healthcare Power of Attorney  Does patient want to make changes to medical advance directive? No - Patient declined  Copy of Healthcare Power of Attorney in Chart? No - copy requested     Chief Complaint  Patient presents with   Follow-up    3 month follow up    HPI:  History of Present Illness Jodi Summers is an 83 year old female with diabetes who presents for a follow-up visit to manage her diabetes and review recent lab results. She is accompanied by her daughter  Glycemic control - Hemoglobin A1c increased to 8.2% from 7.8% in September while on metformin  500 mg twice daily. - Receives assistance from home care for morning medications and meals. -foot exam done today -eye exam due annually done   Dermatologic symptoms - Very dry feet and legs. - Applies Eucerin cream for dryness. - Inconsistent application of topical treatments unless prescribed.  Functional status - Difficulty waking in the morning. - Requires home care assistance for getting up, taking morning medications, lunch, physical activity, and showering.  Mood, cognition, and sleep - Takes Zoloft , Remeron, Wellbutrin, Exelon, Namenda, and melatonin 3 mg nightly for mood, memory, and sleep. - Mood and sleep are stable.  Laboratory abnormalities - Mildly elevated liver enzymes: ALT 72, AST 42. - Vitamin D  low-normal at 33.5, slightly improved from 31.7. - Takes vitamin D  2000 units daily.  09/02/22 Bone density t score -2.7 indicating osteoporosis 05/12/22 negative mammogram   H/o Breast Cancer s/p Right Mastectomy S/P Right hip Arthroplasty  in 9/21 H/o Intraventricular Hemorrhage in 10/16 after a fall Depression Insomnia Orthostatic hypotension, syncope 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   Hyperlipidemia 11/30/2018   Hyponatremia 09/01/2021   Knee pain 02/22/2017   Lactic acidosis 09/01/2021   Leukocytosis 09/01/2021   Lichen sclerosus 03/18/2021   Low back pain 02/22/2017   Malignant neoplasm of upper-outer quadrant of right female breast 1999   Mild dementia, concerns for Alzheimer's disease 06/22/2022   Orthostatic hypotension 09/01/2021   Pain in toe 12/06/2019   Personal history of chemotherapy    Personal history of radiation therapy    Right wrist sprain 09/01/2021   Syncope and collapse 09/01/2021   Unilateral primary osteoarthritis, 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    Past Surgical History:  Procedure Laterality Date   ABDOMINAL HYSTERECTOMY  1998   fibroid   BREAST LUMPECTOMY Right 1999   CHOLECYSTECTOMY     JOINT REPLACEMENT     L knee replacement   TONSILLECTOMY     TOTAL HIP ARTHROPLASTY Right 10/26/2019   Procedure: RIGHT TOTAL HIP ARTHROPLASTY ANTERIOR APPROACH;  Surgeon: Vernetta Lonni GRADE, MD;  Location: [facility];  Service: Orthopedics;  Laterality: Right;   WISDOM TOOTH EXTRACTION      Allergies[1]  Outpatient Encounter Medications as  of 01/30/2024  Medication Sig   buPROPion (WELLBUTRIN XL) 150 MG 24 hr tablet Take 150 mg by mouth daily.   calcium carbonate (OSCAL) 1500 (600 Ca) MG TABS tablet Take 600 mg of elemental calcium by mouth daily with breakfast.   cholecalciferol (VITAMIN D3) 25 MCG (1000 UNIT) tablet Take 2,000 Units by mouth daily.   melatonin 3 MG TABS tablet  Take 3 mg by mouth at bedtime.   memantine (NAMENDA) 10 MG tablet Take 1 tablet by mouth 2 (two) times daily.   metFORMIN  (GLUCOPHAGE ) 500 MG tablet Take 1 tablet (500 mg total) by mouth 2 (two) times daily with a meal.   mirtazapine (REMERON) 15 MG tablet Take 1 tablet by mouth at bedtime.   rivastigmine (EXELON) 13.3 MG/24HR Place 9.5 mg onto the skin daily.   sertraline  (ZOLOFT ) 25 MG tablet Take 1 tablet by mouth at bedtime.   melatonin 1 MG TABS tablet Take 3 mg by mouth at bedtime. (Patient not taking: Reported on 01/30/2024)   mirtazapine (REMERON) 7.5 MG tablet Take 15 mg by mouth at bedtime.   sertraline  (ZOLOFT ) 50 MG tablet Take 25 mg by mouth at bedtime. (Patient not taking: Reported on 01/30/2024)   No facility-administered encounter medications on file as of 01/30/2024.    Review of Systems:  Review of Systems  Constitutional:  Negative for activity change, appetite change, chills, diaphoresis, fatigue and fever.  HENT:  Negative for congestion.   Respiratory:  Negative for cough, shortness of breath and wheezing.   Cardiovascular:  Negative for chest pain and leg swelling.  Gastrointestinal:  Negative for abdominal distention, abdominal pain, constipation, diarrhea, nausea and vomiting.  Genitourinary:  Negative for difficulty urinating, dysuria and urgency.  Musculoskeletal:  Negative for back pain, gait problem, myalgias and neck pain.  Skin:  Negative for rash.  Neurological:  Negative for dizziness and weakness.  Psychiatric/Behavioral:  Negative for confusion and sleep disturbance. The patient is not nervous/anxious.        Memory loss    Health Maintenance  Topic Date Due   Diabetic kidney evaluation - Urine ACR  Never done   Zoster Vaccines- Shingrix (2 of 2) 04/25/2023   Mammogram  05/12/2023   Medicare Annual Wellness (AWV)  08/30/2023   Influenza Vaccine  09/16/2023   COVID-19 Vaccine (5 - 2025-26 season) 10/17/2023   OPHTHALMOLOGY EXAM  10/25/2023   FOOT  EXAM  12/20/2023   HEMOGLOBIN A1C  04/22/2024   Diabetic kidney evaluation - eGFR measurement  10/23/2024   DTaP/Tdap/Td (2 - Td or Tdap) 08/31/2032   Pneumococcal Vaccine: 50+ Years  Completed   Bone Density Scan  Completed   Meningococcal B Vaccine  Aged Out    Physical Exam: Vitals:   01/30/24 1458  BP: 138/80  Pulse: (!) 110  Temp: 98 F (36.7 C)  SpO2: 95%  Weight: 161 lb 12.8 oz (73.4 kg)  Height: 5' 5 (1.651 m)   Body mass index is 26.92 kg/m. Wt Readings from Last 3 Encounters:  01/30/24 161 lb 12.8 oz (73.4 kg)  12/13/23 160 lb (72.6 kg)  10/25/23 159 lb 3.2 oz (72.2 kg)    Physical Exam Vitals and nursing note reviewed.  Constitutional:      General: She is not in acute distress.    Appearance: Normal appearance. She is not diaphoretic.  HENT:     Head: Normocephalic and atraumatic.     Nose: No congestion.     Mouth/Throat:     Mouth:  Mucous membranes are moist.     Pharynx: Oropharynx is clear. No oropharyngeal exudate or posterior oropharyngeal erythema.  Eyes:     Conjunctiva/sclera: Conjunctivae normal.     Pupils: Pupils are equal, round, and reactive to light.  Neck:     Thyroid : No thyromegaly.     Vascular: No carotid bruit or JVD.  Cardiovascular:     Rate and Rhythm: Normal rate and regular rhythm.     Pulses:          Dorsalis pedis pulses are 2+ on the right side and 2+ on the left side.     Heart sounds: Normal heart sounds. No murmur heard. Pulmonary:     Effort: Pulmonary effort is normal. No respiratory distress.     Breath sounds: Normal breath sounds. No stridor.  Abdominal:     General: Bowel sounds are normal. There is no distension.     Palpations: Abdomen is soft.     Tenderness: There is no abdominal tenderness. There is no right CVA tenderness or left CVA tenderness.  Musculoskeletal:     Cervical back: No rigidity. No muscular tenderness.     Right lower leg: No edema.     Left lower leg: No edema.     Right foot: Normal  range of motion.     Left foot: Normal range of motion.  Feet:     Right foot:     Protective Sensation: 5 sites tested.       Skin integrity: No ulcer.     Left foot:     Protective Sensation: 5 sites tested.       Skin integrity: No ulcer.     Comments: Flat feet Lymphadenopathy:     Cervical: No cervical adenopathy.  Skin:    General: Skin is warm and dry.  Neurological:     General: No focal deficit present.     Mental Status: She is alert and oriented to person, place, and time. Mental status is at baseline.     Cranial Nerves: No cranial nerve deficit.  Psychiatric:        Mood and Affect: Mood normal.     Labs reviewed: Basic Metabolic Panel: Recent Labs    04/14/23 0000 07/22/23 0000 10/24/23 0000  NA 137 138  138 137  K 4.1 3.9  3.9 3.7  CL 103 103  103 103  CO2 21 22  22 22   BUN 11 11  11 11   CREATININE 0.7 0.8  0.8 0.7  CALCIUM 8.8 8.9  8.9 9.3  TSH  --  1.79  1.79  --    Liver Function Tests: Recent Labs    07/22/23 0000 10/24/23 0000  AST 39*  39* 46*  ALT 59*  59* 63*  ALKPHOS 65  65 65  ALBUMIN 3.8  3.8 3.7   No results for input(s): LIPASE, AMYLASE in the last 8760 hours. No results for input(s): AMMONIA in the last 8760 hours. CBC: Recent Labs    07/22/23 0000  WBC 9.7  9.7  HGB 12.9  12.9  HCT 38  38  PLT 170  170   Lipid Panel: Recent Labs    10/24/23 0000  CHOL 197  HDL 40  LDLCALC 123  TRIG 760*   Lab Results  Component Value Date   HGBA1C 7.8 10/24/2023    Procedures since last visit: No results found.  Assessment/Plan Assessment and Plan Assessment & Plan Type 2 diabetes mellitus A1c increased to 8.2  from 7.8, indicating suboptimal glycemic control. - Increased metformin  to 750 mg in the morning and 500 mg at night for one week, then 750 mg twice daily. - Will repeat A1c prior to next appointment.  Elevated liver enzymes, possible fatty liver Liver enzymes slightly elevated with ALT at  72 and AST at 42. Possible fatty liver due to diabetes. - Ordered liver ultrasound to assess for fatty liver or other abnormalities.  Dementia Managed with Namenda and Exelon.  Depression Managed with Zoloft , Remeron, and Wellbutrin. No concerns about mood or depression reported. - Continue current antidepressant regimen.  Xerosis cutis Dry skin on feet and legs, likely exacerbated by diabetes. - Prescribed Eucerin for daily use to maintain skin moisture.  Vitamin D  deficiency Vitamin D  level at 33.5, slightly improved from previous 31.7. - Increased vitamin D  supplementation to 5000 units daily.  General Health Maintenance Vaccination status reviewed. Shingles vaccine administered in June. Previous adverse reaction to Shingrix noted. - Will verify COVID and flu vaccine status with pharmacy. - Will ensure follow-up on Shingrix vaccine administration.      Labs/tests ordered:  * No order type specified * Vit D Hgb A1C Lipid panel CMP CBC prior to next apt Next appt:  3months    Total time  time greater than 50% of total time spent doing pt counseling and coordination of care         [1] No Known Allergies

## 2024-02-06 ENCOUNTER — Telehealth: Payer: Self-pay | Admitting: Adult Health

## 2024-02-06 ENCOUNTER — Encounter: Payer: Self-pay | Admitting: Gastroenterology

## 2024-02-06 DIAGNOSIS — K760 Fatty (change of) liver, not elsewhere classified: Secondary | ICD-10-CM

## 2024-02-06 NOTE — Telephone Encounter (Signed)
 Abd US  done at [facility] indicates fatty infiltration of the liver and pancreas. Discussed findings with her daughter. We recommend a GI referral. Nurse at [facility] to facilitate apt.

## 2024-02-13 ENCOUNTER — Ambulatory Visit: Admitting: Gastroenterology

## 2024-03-15 ENCOUNTER — Ambulatory Visit: Admitting: Gastroenterology

## 2024-03-15 ENCOUNTER — Encounter: Payer: Self-pay | Admitting: Gastroenterology

## 2024-03-15 ENCOUNTER — Other Ambulatory Visit: Payer: Self-pay | Admitting: *Deleted

## 2024-03-15 ENCOUNTER — Other Ambulatory Visit

## 2024-03-15 VITALS — BP 126/64 | Ht 65.0 in | Wt 163.0 lb

## 2024-03-15 DIAGNOSIS — E785 Hyperlipidemia, unspecified: Secondary | ICD-10-CM | POA: Diagnosis not present

## 2024-03-15 DIAGNOSIS — K760 Fatty (change of) liver, not elsewhere classified: Secondary | ICD-10-CM

## 2024-03-15 DIAGNOSIS — R7989 Other specified abnormal findings of blood chemistry: Secondary | ICD-10-CM | POA: Diagnosis not present

## 2024-03-15 DIAGNOSIS — E118 Type 2 diabetes mellitus with unspecified complications: Secondary | ICD-10-CM | POA: Diagnosis not present

## 2024-03-15 DIAGNOSIS — R748 Abnormal levels of other serum enzymes: Secondary | ICD-10-CM | POA: Diagnosis not present

## 2024-03-15 NOTE — Patient Instructions (Addendum)
-   Abstain from all alcohol including beer, wine, liquor, and non-alcoholic beer.  - Work to maintain a healthy weight - Maximize control of any hyperglycemia and hyperlipidemia   Please go to the lab in the basement of our building to have lab work done as you leave today. Hit B for basement when you get on the elevator.  When the doors open the lab is on your left.  We will call you with the results. Thank you.  _______________________________________________________  If your blood pressure at your visit was 140/90 or greater, please contact your primary care physician to follow up on this.  _______________________________________________________  If you are age 82 or older, your body mass index should be between 23-30. Your Body mass index is 27.12 kg/m. If this is out of the aforementioned range listed, please consider follow up with your Primary Care Provider.  If you are age 82 or younger, your body mass index should be between 19-25. Your Body mass index is 27.12 kg/m. If this is out of the aformentioned range listed, please consider follow up with your Primary Care Provider.   ________________________________________________________  The [facility] providers would like to encourage you to use MYCHART to communicate with providers for non-urgent requests or questions.  Due to long hold times on the telephone, sending your provider a message by [facility] may be a faster and more efficient way to get a response.  Please allow 48 business hours for a response.  Please remember that this is for non-urgent requests.  _______________________________________________________  [facility] is using a team-based approach to care.  Your team is made up of your doctor and two to three APPS. Our APPS (Nurse Practitioners and Physician Assistants) work with your physician to ensure care continuity for you. They are fully qualified to address your health concerns and develop a treatment  plan. They communicate directly with your gastroenterologist to care for you. Seeing the Advanced Practice Practitioners on your physician's team can help you by facilitating care more promptly, often allowing for earlier appointments, access to diagnostic testing, procedures, and other specialty referrals.   Due to recent changes in healthcare laws, you may see the results of your imaging and laboratory studies on MyChart before your provider has had a chance to review them.  We understand that in some cases there may be results that are confusing or concerning to you. Not all laboratory results come back in the same time frame and the provider may be waiting for multiple results in order to interpret others.  Please give us  48 hours in order for your provider to thoroughly review all the results before contacting the office for clarification of your results.

## 2024-03-15 NOTE — Progress Notes (Signed)
 "  Jodi Summers [id number] January 11, 1941   Chief Complaint: Fatty liver, elevated LFTs  Referring Provider: Charlanne Fredia CROME, MD Primary GI MD: Unassigned  HPI: Jodi Summers is a 84 y.o. female with past medical history of Alzheimer's dementia, T2DM, osteoporosis, HLD, breast cancer s/p lumpectomy chemo and radiation, hysterectomy, cholecystectomy who presents today for evaluation of fatty liver and elevated liver enzymes.    Patient is referred for further evaluation of fatty liver and elevated LFTs.  Per telephone encounter 02/06/2024, abdominal ultrasound done at [facility] showed fatty infiltration of the liver and pancreas.  GI referral was recommended.  Per PCP note 10/25/2023 patient has had no alcohol use.  Plan was for liver ultrasound and repeat LFTs in 3 months at that time.  Statin therapy deferred in setting of elevated liver enzymes.  Most recent hepatic function panel 01/24/2024 with alk phos 72, ALT 72, AST 42, normal total bilirubin. HgbA1C 8.2.  Looks like HFP was normal 07/15/2022, since then has had elevations in ALT/AST.   Discussed the use of AI scribe software for clinical note transcription with the patient, who gave verbal consent to proceed.  History of Present Illness Jodi Summers is an 84 year old female with type 2 diabetes, hyperlipidemia, and prior cholecystectomy who presents for evaluation of elevated liver enzymes and fatty liver. She is accompanied by her daughter who helps provide history.  Hepatic Enzyme Elevation and Steatosis: - Persistent elevation of liver enzymes over the past six months - Recent ultrasound demonstrated fatty liver (report unavailable at visit) - No prior liver disease or hepatitis - No history of heavy alcohol use (rare consumption) - No family history of liver disease, including cirrhosis - No abdominal pain, nausea, vomiting, jaundice, pruritus, abdominal swelling, or changes in bowel habits - Bowel movements regular with  normal color; no pale or yellow stools  Metabolic Comorbidities: - Type 2 diabetes mellitus; recent increase in metformin  dose, effect not yet assessed - Mixed hyperlipidemia with elevated triglycerides and cholesterol - No history of thyroid  dysfunction; thyroid  function tested June 2025  Post-Cholecystectomy Status: - Underwent cholecystectomy in the 1990s for gallstones  Constitutional and Cardiopulmonary Symptoms: - No chest pain or shortness of breath   Previous GI Procedures/Imaging      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   History of COVID-19 2024   History of hysterectomy 03/18/2021   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   Hyperlipidemia 11/30/2018   Hyponatremia 09/01/2021   Knee pain 02/22/2017   Lactic acidosis 09/01/2021   Leukocytosis 09/01/2021   Lichen sclerosus 03/18/2021   Low back pain 02/22/2017   Malignant neoplasm of upper-outer quadrant of right female breast 1999   Mild dementia, concerns for Alzheimer's disease 06/22/2022   Orthostatic hypotension 09/01/2021   Pain in toe 12/06/2019   Personal history of chemotherapy    Personal history of radiation therapy    Right wrist sprain 09/01/2021   Syncope and collapse 09/01/2021   Unilateral primary osteoarthritis, 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    Past Surgical History:  Procedure Laterality Date   ABDOMINAL HYSTERECTOMY  1998   fibroid   BREAST LUMPECTOMY Right 1999  CHOLECYSTECTOMY     JOINT REPLACEMENT     L knee replacement   TONSILLECTOMY     TOTAL HIP ARTHROPLASTY Right 10/26/2019   Procedure: RIGHT TOTAL HIP ARTHROPLASTY ANTERIOR APPROACH;   Surgeon: Vernetta Lonni GRADE, MD;  Location: [facility];  Service: Orthopedics;  Laterality: Right;   WISDOM TOOTH EXTRACTION      Current Outpatient Medications  Medication Sig Dispense Refill   buPROPion (WELLBUTRIN XL) 150 MG 24 hr tablet Take 150 mg by mouth daily.     calcium carbonate (OSCAL) 1500 (600 Ca) MG TABS tablet Take 600 mg of elemental calcium by mouth daily with breakfast.     cholecalciferol (VITAMIN D3) 25 MCG (1000 UNIT) tablet Take 5,000 Units by mouth daily.     melatonin 3 MG TABS tablet Take 3 mg by mouth at bedtime.     memantine (NAMENDA) 10 MG tablet Take 1 tablet by mouth 2 (two) times daily.     metFORMIN  (GLUCOPHAGE ) 500 MG tablet Take 1.5 tablets (750 mg total) by mouth 2 (two) times daily with a meal. Take 750 mg in the morning and 500 mg in the evening for 1 week then 750 mg bid     mirtazapine (REMERON) 15 MG tablet Take 1 tablet by mouth at bedtime.     mirtazapine (REMERON) 7.5 MG tablet Take 15 mg by mouth at bedtime.     rivastigmine (EXELON) 13.3 MG/24HR Place 9.5 mg onto the skin daily.     sertraline  (ZOLOFT ) 25 MG tablet Take 1 tablet by mouth at bedtime.     Skin Protectants, Misc. (EUCERIN) cream Apply topically daily at 6 (six) AM.     No current facility-administered medications for this visit.    Allergies as of 03/15/2024   (No Known Allergies)    Family History  Problem Relation Age of Onset   Alzheimer's disease Mother    Alzheimer's disease Sister     Social History[1]   Review of Systems:    Constitutional: No weight loss, fever, chills Cardiovascular: No chest pain Respiratory: No SOB Gastrointestinal: See HPI and otherwise negative   Physical Exam:  Vital signs: BP 126/64   Ht 5' 5 (1.651 m)   Wt 163 lb (73.9 kg)   BMI 27.12 kg/m   Wt Readings from Last 3 Encounters:  03/15/24 163 lb (73.9 kg)  01/30/24 161 lb 12.8 oz (73.4 kg)  12/13/23 160 lb (72.6 kg)     Constitutional: Pleasant, well-appearing female in  NAD, alert and cooperative Head:  Normocephalic and atraumatic.  Eyes: No scleral icterus. Respiratory: Respirations even and unlabored. Lungs clear to auscultation bilaterally.  No wheezes, crackles, or rhonchi.  Cardiovascular:  Regular rate and rhythm. No murmurs. No peripheral edema. Gastrointestinal:  Soft, nondistended, nontender. No rebound or guarding. Normal bowel sounds. No appreciable masses or hepatomegaly. Rectal:  Not performed.  Neurologic:  Alert and oriented x4;  grossly normal neurologically.  Skin:   Dry and intact without significant lesions or rashes. Psychiatric: Oriented to person, place. Demonstrates good judgement and reason without abnormal affect or behaviors.  Assessment/Plan:   Assessment & Plan Hepatic steatosis Elevated liver enzymes Patient seen today for evaluation of elevated liver enzymes over the last 6 months as well as findings of hepatic steatosis on recent abdominal imaging.  At time of visit ultrasound report was not available.  No family history of liver disease, no personal history of hepatitis, not experiencing any abdominal pain or swelling, nausea, vomiting, pale stools, diarrhea,  jaundice, pruritus, etc.  History of cholecystectomy.  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 Must exclude other chronic causes of hepatocellular inflammation that can mimic fatty liver on imaging.  (Fibrosis 4 Score = 2.36 Score is based on outdated labs. ALT, AST, and platelets should all be measured within the last 6 months for an accurate FIB-4 Score Fib-4 interpretation is not validated for people under 35 or over 62 years of age. However, scores under 2.0 are generally considered low risk.)   - Labs today: CBC, HFP, PT/INR, HepCAb, HepBsAg, HepBsAb, HepBcAb, HAV, Iron panel, ferritin, IgG, ANA, ASMA, AMA, TTG IgA, IgA, Alpha-one-antitrypsin level    - Counseled on weight reduction, dietary changes,  and optimal management of diabetes and hyperlipidemia. - Advised strict alcohol abstinence. - Requested prior liver ultrasound report for review. - Placed on wait list for FibroScan to assess fibrosis. - Had recommended office follow up in 3 months, they declined at this time - Will review lab and imaging results and provide further recommendations.    Assigned to Dr. Wilhelmenia today.   Update: US  results received after clinic today.   Abdominal ultrasound 02/01/2024 Impression: The liver is mildly enlarged and demonstrates moderate diffuse increased echogenicity likely related to fatty infiltration.  The pancreas demonstrates mild diffuse increased echogenicity also likely related to fatty infiltration.  The gallbladder is surgically absent.   Camie Furbish, PA-C [facility] 03/16/2024, 9:24 AM  Patient Care Team: Charlanne Fredia CROME, MD as PCP - General (Internal Medicine)       [1]  Social History Tobacco Use   Smoking status: Never   Smokeless tobacco: Never  Vaping Use   Vaping status: Never Used  Substance Use Topics   Alcohol use: Not Currently    Comment: occ   Drug use: Never   "

## 2024-03-16 ENCOUNTER — Encounter: Payer: Self-pay | Admitting: Gastroenterology

## 2024-03-16 LAB — IBC + FERRITIN
Ferritin: 122 ng/mL (ref 10.0–291.0)
Iron: 104 ug/dL (ref 42–145)
Saturation Ratios: 28.7 % (ref 20.0–50.0)
TIBC: 362.6 ug/dL (ref 250.0–450.0)
Transferrin: 259 mg/dL (ref 212.0–360.0)

## 2024-03-16 LAB — CBC WITH DIFFERENTIAL/PLATELET
Basophils Absolute: 0.1 10*3/uL (ref 0.0–0.1)
Basophils Relative: 1 % (ref 0.0–3.0)
Eosinophils Absolute: 0.1 10*3/uL (ref 0.0–0.7)
Eosinophils Relative: 1.4 % (ref 0.0–5.0)
HCT: 39.9 % (ref 36.0–46.0)
Hemoglobin: 13.7 g/dL (ref 12.0–15.0)
Lymphocytes Relative: 30.7 % (ref 12.0–46.0)
Lymphs Abs: 2.5 10*3/uL (ref 0.7–4.0)
MCHC: 34.2 g/dL (ref 30.0–36.0)
MCV: 89.1 fl (ref 78.0–100.0)
Monocytes Absolute: 0.7 10*3/uL (ref 0.1–1.0)
Monocytes Relative: 8.7 % (ref 3.0–12.0)
Neutro Abs: 4.8 10*3/uL (ref 1.4–7.7)
Neutrophils Relative %: 58.2 % (ref 43.0–77.0)
Platelets: 215 10*3/uL (ref 150.0–400.0)
RBC: 4.48 Mil/uL (ref 3.87–5.11)
RDW: 13.3 % (ref 11.5–15.5)
WBC: 8.2 10*3/uL (ref 4.0–10.5)

## 2024-03-16 LAB — HEPATIC FUNCTION PANEL
ALT: 68 U/L — ABNORMAL HIGH (ref 3–35)
AST: 57 U/L — ABNORMAL HIGH (ref 5–37)
Albumin: 4.2 g/dL (ref 3.5–5.2)
Alkaline Phosphatase: 63 U/L (ref 39–117)
Bilirubin, Direct: 0.1 mg/dL (ref 0.1–0.3)
Total Bilirubin: 0.3 mg/dL (ref 0.2–1.2)
Total Protein: 7.4 g/dL (ref 6.0–8.3)

## 2024-03-17 NOTE — Progress Notes (Signed)
 Attending Physician's Attestation   I have reviewed the chart.   I agree with the Advanced Practitioner's note, impression, and recommendations with any updates as below.    Corliss Parish, MD [facility] Advanced Endoscopy Office # [phone]

## 2024-03-20 LAB — ANTI-SMOOTH MUSCLE ANTIBODY, IGG: Actin (Smooth Muscle) Antibody (IGG): <20 U

## 2024-03-20 LAB — ANTI-NUCLEAR AB-TITER (ANA TITER): ANA Titer 1: 1:40 {titer} — ABNORMAL HIGH

## 2024-03-20 LAB — IGG: IgG (Immunoglobin G), Serum: 1069 mg/dL (ref 600–1540)

## 2024-03-20 LAB — IGA: Immunoglobulin A: 428 mg/dL — ABNORMAL HIGH (ref 70–320)

## 2024-03-20 LAB — ALPHA-1-ANTITRYPSIN: A-1 Antitrypsin, Ser: 139 mg/dL (ref 83–199)

## 2024-03-20 LAB — MITOCHONDRIAL ANTIBODIES: Mitochondrial M2 Ab, IgG: <=20 U

## 2024-03-20 LAB — ANA: Anti Nuclear Antibody (ANA): POSITIVE — AB

## 2024-03-20 LAB — HEPATITIS B CORE ANTIBODY, TOTAL: Hep B Core Total Ab: NONREACTIVE

## 2024-03-20 LAB — HEPATITIS B SURFACE ANTIGEN: Hepatitis B Surface Ag: NONREACTIVE

## 2024-03-20 LAB — HEPATITIS B SURFACE ANTIBODY,QUALITATIVE: Hep B S Ab: NONREACTIVE

## 2024-03-20 LAB — HEPATITIS C ANTIBODY: Hepatitis C Ab: NONREACTIVE

## 2024-03-20 LAB — HEPATITIS A ANTIBODY, TOTAL: Hepatitis A AB,Total: NONREACTIVE

## 2024-05-01 ENCOUNTER — Encounter: Admitting: Internal Medicine

## 2024-05-11 ENCOUNTER — Ambulatory Visit: Admitting: Gastroenterology

## 2024-06-15 ENCOUNTER — Ambulatory Visit: Admitting: Physician Assistant
